# Patient Record
Sex: Female | Born: 1955
Health system: Southern US, Community
[De-identification: ages and names within clinical notes are randomized; demographics above are authoritative.]

## PROBLEM LIST (undated history)

## (undated) DIAGNOSIS — N189 Chronic kidney disease, unspecified: Secondary | ICD-10-CM

## (undated) DIAGNOSIS — F17200 Nicotine dependence, unspecified, uncomplicated: Secondary | ICD-10-CM

## (undated) DIAGNOSIS — M1990 Unspecified osteoarthritis, unspecified site: Secondary | ICD-10-CM

## (undated) DIAGNOSIS — I10 Essential (primary) hypertension: Secondary | ICD-10-CM

## (undated) DIAGNOSIS — F329 Major depressive disorder, single episode, unspecified: Secondary | ICD-10-CM

## (undated) DIAGNOSIS — F32A Depression, unspecified: Secondary | ICD-10-CM

## (undated) DIAGNOSIS — R918 Other nonspecific abnormal finding of lung field: Secondary | ICD-10-CM

## (undated) DIAGNOSIS — L930 Discoid lupus erythematosus: Secondary | ICD-10-CM

## (undated) DIAGNOSIS — F419 Anxiety disorder, unspecified: Secondary | ICD-10-CM

## (undated) DIAGNOSIS — Z8719 Personal history of other diseases of the digestive system: Secondary | ICD-10-CM

## (undated) DIAGNOSIS — J449 Chronic obstructive pulmonary disease, unspecified: Secondary | ICD-10-CM

## (undated) DIAGNOSIS — K219 Gastro-esophageal reflux disease without esophagitis: Secondary | ICD-10-CM

## (undated) DIAGNOSIS — C801 Malignant (primary) neoplasm, unspecified: Secondary | ICD-10-CM

## (undated) DIAGNOSIS — E222 Syndrome of inappropriate secretion of antidiuretic hormone: Secondary | ICD-10-CM

## (undated) DIAGNOSIS — G4730 Sleep apnea, unspecified: Secondary | ICD-10-CM

## (undated) DIAGNOSIS — F1011 Alcohol abuse, in remission: Secondary | ICD-10-CM

## (undated) HISTORY — PX: SHOULDER SURGERY: SHX246

## (undated) HISTORY — PX: PANCREAS SURGERY: SHX731

## (undated) HISTORY — PX: BREAST EXCISIONAL BIOPSY: SUR124

## (undated) HISTORY — PX: WISDOM TOOTH EXTRACTION: SHX21

## (undated) HISTORY — PX: COLONOSCOPY W/ BIOPSIES AND POLYPECTOMY: SHX1376

## (undated) HISTORY — PX: ESOPHAGOGASTRODUODENOSCOPY: SHX1529

## (undated) HISTORY — PX: CHOLECYSTECTOMY: SHX55

---

## 1999-06-17 ENCOUNTER — Other Ambulatory Visit: Admission: RE | Admit: 1999-06-17 | Discharge: 1999-06-17 | Payer: Self-pay | Admitting: Obstetrics and Gynecology

## 2000-06-25 ENCOUNTER — Other Ambulatory Visit: Admission: RE | Admit: 2000-06-25 | Discharge: 2000-06-25 | Payer: Self-pay | Admitting: Obstetrics and Gynecology

## 2001-03-31 ENCOUNTER — Encounter: Payer: Self-pay | Admitting: Family Medicine

## 2001-03-31 ENCOUNTER — Encounter: Admission: RE | Admit: 2001-03-31 | Discharge: 2001-03-31 | Payer: Self-pay | Admitting: Family Medicine

## 2001-04-12 ENCOUNTER — Encounter: Admission: RE | Admit: 2001-04-12 | Discharge: 2001-04-12 | Payer: Self-pay | Admitting: Family Medicine

## 2001-04-12 ENCOUNTER — Encounter: Payer: Self-pay | Admitting: Family Medicine

## 2001-04-30 ENCOUNTER — Ambulatory Visit (HOSPITAL_COMMUNITY): Admission: RE | Admit: 2001-04-30 | Discharge: 2001-04-30 | Payer: Self-pay | Admitting: Family Medicine

## 2001-04-30 ENCOUNTER — Encounter: Payer: Self-pay | Admitting: Family Medicine

## 2001-04-30 ENCOUNTER — Encounter (INDEPENDENT_AMBULATORY_CARE_PROVIDER_SITE_OTHER): Payer: Self-pay | Admitting: *Deleted

## 2002-09-06 ENCOUNTER — Other Ambulatory Visit: Admission: RE | Admit: 2002-09-06 | Discharge: 2002-09-06 | Payer: Self-pay | Admitting: Obstetrics and Gynecology

## 2003-01-19 ENCOUNTER — Encounter: Admission: RE | Admit: 2003-01-19 | Discharge: 2003-01-19 | Payer: Self-pay | Admitting: Family Medicine

## 2006-10-28 ENCOUNTER — Inpatient Hospital Stay (HOSPITAL_COMMUNITY): Admission: EM | Admit: 2006-10-28 | Discharge: 2006-10-30 | Payer: Self-pay | Admitting: Emergency Medicine

## 2007-02-22 ENCOUNTER — Ambulatory Visit (HOSPITAL_COMMUNITY): Admission: RE | Admit: 2007-02-22 | Discharge: 2007-02-22 | Payer: Self-pay | Admitting: Surgery

## 2007-02-22 ENCOUNTER — Encounter (INDEPENDENT_AMBULATORY_CARE_PROVIDER_SITE_OTHER): Payer: Self-pay | Admitting: Surgery

## 2007-03-17 ENCOUNTER — Other Ambulatory Visit: Admission: RE | Admit: 2007-03-17 | Discharge: 2007-03-17 | Payer: Self-pay | Admitting: Family Medicine

## 2007-04-01 ENCOUNTER — Encounter: Admission: RE | Admit: 2007-04-01 | Discharge: 2007-04-01 | Payer: Self-pay | Admitting: Family Medicine

## 2007-06-23 ENCOUNTER — Other Ambulatory Visit: Admission: RE | Admit: 2007-06-23 | Discharge: 2007-06-23 | Payer: Self-pay | Admitting: Diagnostic Radiology

## 2007-06-23 ENCOUNTER — Encounter: Admission: RE | Admit: 2007-06-23 | Discharge: 2007-06-23 | Payer: Self-pay | Admitting: Dermatology

## 2007-06-23 ENCOUNTER — Encounter (INDEPENDENT_AMBULATORY_CARE_PROVIDER_SITE_OTHER): Payer: Self-pay | Admitting: Diagnostic Radiology

## 2008-02-04 HISTORY — PX: CHOLECYSTECTOMY: SHX55

## 2008-09-01 ENCOUNTER — Encounter: Admission: RE | Admit: 2008-09-01 | Discharge: 2008-09-01 | Payer: Self-pay | Admitting: Gastroenterology

## 2008-09-23 ENCOUNTER — Ambulatory Visit: Payer: Self-pay | Admitting: Diagnostic Radiology

## 2008-09-23 ENCOUNTER — Encounter: Payer: Self-pay | Admitting: Emergency Medicine

## 2008-09-24 ENCOUNTER — Observation Stay (HOSPITAL_COMMUNITY): Admission: EM | Admit: 2008-09-24 | Discharge: 2008-09-25 | Payer: Self-pay | Admitting: Internal Medicine

## 2009-01-02 ENCOUNTER — Ambulatory Visit (HOSPITAL_COMMUNITY): Admission: RE | Admit: 2009-01-02 | Discharge: 2009-01-02 | Payer: Self-pay | Admitting: Gastroenterology

## 2009-10-10 ENCOUNTER — Ambulatory Visit: Payer: Self-pay | Admitting: Diagnostic Radiology

## 2009-10-10 ENCOUNTER — Encounter: Payer: Self-pay | Admitting: Emergency Medicine

## 2010-01-10 ENCOUNTER — Inpatient Hospital Stay (HOSPITAL_COMMUNITY): Admission: EM | Admit: 2010-01-10 | Discharge: 2009-10-13 | Payer: Self-pay | Admitting: Internal Medicine

## 2010-04-18 LAB — URINALYSIS, ROUTINE W REFLEX MICROSCOPIC
Bilirubin Urine: NEGATIVE
Glucose, UA: NEGATIVE mg/dL
Hgb urine dipstick: NEGATIVE
Ketones, ur: NEGATIVE mg/dL
Nitrite: NEGATIVE
Protein, ur: NEGATIVE mg/dL
Specific Gravity, Urine: 1.003 — ABNORMAL LOW (ref 1.005–1.030)
Urobilinogen, UA: 0.2 mg/dL (ref 0.0–1.0)
pH: 6.5 (ref 5.0–8.0)

## 2010-04-18 LAB — DIFFERENTIAL
Basophils Absolute: 0.1 K/uL (ref 0.0–0.1)
Basophils Relative: 2 % — ABNORMAL HIGH (ref 0–1)
Eosinophils Absolute: 0.2 K/uL (ref 0.0–0.7)
Eosinophils Relative: 2 % (ref 0–5)
Lymphocytes Relative: 21 % (ref 12–46)
Lymphs Abs: 2 K/uL (ref 0.7–4.0)
Monocytes Absolute: 0.8 K/uL (ref 0.1–1.0)
Monocytes Relative: 8 % (ref 3–12)
Neutro Abs: 6.2 K/uL (ref 1.7–7.7)
Neutrophils Relative %: 67 % (ref 43–77)

## 2010-04-18 LAB — COMPREHENSIVE METABOLIC PANEL
ALT: 18 U/L (ref 0–35)
Albumin: 3.3 g/dL — ABNORMAL LOW (ref 3.5–5.2)
Alkaline Phosphatase: 90 U/L (ref 39–117)
BUN: 2 mg/dL — ABNORMAL LOW (ref 6–23)
BUN: 8 mg/dL (ref 6–23)
CO2: 21 mEq/L (ref 19–32)
Calcium: 9.1 mg/dL (ref 8.4–10.5)
Calcium: 9.8 mg/dL (ref 8.4–10.5)
Creatinine, Ser: 0.55 mg/dL (ref 0.4–1.2)
GFR calc non Af Amer: >60 mL/min (ref >60)
Glucose, Bld: 112 mg/dL — ABNORMAL HIGH (ref 70–99)
Potassium: 4.2 mEq/L (ref 3.5–5.1)
Total Protein: 6.9 g/dL (ref 6.0–8.3)
Total Protein: 8.5 g/dL — ABNORMAL HIGH (ref 6.0–8.3)

## 2010-04-18 LAB — CBC
HCT: 33.4 % — ABNORMAL LOW (ref 36.0–46.0)
Hemoglobin: 11.5 g/dL — ABNORMAL LOW (ref 12.0–15.0)
MCH: 32.6 pg (ref 26.0–34.0)
MCH: 33.2 pg (ref 26.0–34.0)
MCH: 33.9 pg (ref 26.0–34.0)
MCHC: 34.4 g/dL (ref 30.0–36.0)
MCHC: 34.4 g/dL (ref 30.0–36.0)
MCHC: 34.6 g/dL (ref 30.0–36.0)
MCHC: 35.6 g/dL (ref 30.0–36.0)
MCV: 93.2 fL (ref 78.0–100.0)
MCV: 94.6 fL (ref 78.0–100.0)
Platelets: 293 10*3/uL (ref 150–400)
Platelets: 309 10*3/uL (ref 150–400)
RDW: 12.4 % (ref 11.5–15.5)
RDW: 12.4 % (ref 11.5–15.5)
RDW: 12.6 % (ref 11.5–15.5)
WBC: 10.9 10*3/uL — ABNORMAL HIGH (ref 4.0–10.5)

## 2010-04-18 LAB — LIPID PANEL
Cholesterol: 165 mg/dL (ref 0–200)
Cholesterol: 169 mg/dL (ref 0–200)
HDL: 43 mg/dL (ref >39)
HDL: 45 mg/dL (ref >39)
LDL Cholesterol: 102 mg/dL — ABNORMAL HIGH (ref 0–99)
Total CHOL/HDL Ratio: 3.8 RATIO
Total CHOL/HDL Ratio: 3.8 RATIO
Triglycerides: 109 mg/dL (ref <150)
Triglycerides: 133 mg/dL (ref <150)

## 2010-04-18 LAB — URINE MICROSCOPIC-ADD ON

## 2010-04-18 LAB — LIPASE, BLOOD
Lipase: 200 U/L — ABNORMAL HIGH (ref 11–59)
Lipase: 266 U/L — ABNORMAL HIGH (ref 11–59)
Lipase: >2000 U/L — ABNORMAL HIGH (ref 23–300)

## 2010-04-18 LAB — POCT CARDIAC MARKERS
CKMB, poc: <1 ng/mL — ABNORMAL LOW (ref 1.0–8.0)
Myoglobin, poc: 35 ng/mL (ref 12–200)

## 2010-04-18 LAB — BASIC METABOLIC PANEL
CO2: 21 mEq/L (ref 19–32)
Glucose, Bld: 103 mg/dL — ABNORMAL HIGH (ref 70–99)
Potassium: 3.9 mEq/L (ref 3.5–5.1)
Sodium: 139 mEq/L (ref 135–145)

## 2010-05-11 LAB — URINE MICROSCOPIC-ADD ON

## 2010-05-11 LAB — COMPREHENSIVE METABOLIC PANEL
ALT: 34 U/L (ref 0–35)
ALT: 35 U/L (ref 0–35)
AST: 48 U/L — ABNORMAL HIGH (ref 0–37)
AST: 84 U/L — ABNORMAL HIGH (ref 0–37)
Albumin: 3 g/dL — ABNORMAL LOW (ref 3.5–5.2)
Albumin: 4.1 g/dL (ref 3.5–5.2)
Alkaline Phosphatase: 671 U/L — ABNORMAL HIGH (ref 39–117)
BUN: 6 mg/dL (ref 6–23)
CO2: 22 mEq/L (ref 19–32)
CO2: 23 mEq/L (ref 19–32)
Chloride: 95 mEq/L — ABNORMAL LOW (ref 96–112)
Chloride: 99 mEq/L (ref 96–112)
GFR calc Af Amer: >60 mL/min (ref >60)
GFR calc Af Amer: >60 mL/min (ref >60)
GFR calc non Af Amer: >60 mL/min (ref >60)
Potassium: 4.1 mEq/L (ref 3.5–5.1)
Sodium: 136 mEq/L (ref 135–145)
Total Bilirubin: 3.9 mg/dL — ABNORMAL HIGH (ref 0.3–1.2)
Total Protein: 7.4 g/dL (ref 6.0–8.3)

## 2010-05-11 LAB — URINALYSIS, ROUTINE W REFLEX MICROSCOPIC
Hgb urine dipstick: NEGATIVE
Ketones, ur: 15 mg/dL — AB
Nitrite: NEGATIVE
Urobilinogen, UA: 2 mg/dL — ABNORMAL HIGH (ref 0.0–1.0)
pH: 6.5 (ref 5.0–8.0)

## 2010-05-11 LAB — DIFFERENTIAL
Basophils Absolute: 0.1 10*3/uL (ref 0.0–0.1)
Eosinophils Absolute: 0 10*3/uL (ref 0.0–0.7)
Eosinophils Relative: 0 % (ref 0–5)
Monocytes Absolute: 0.7 10*3/uL (ref 0.1–1.0)

## 2010-05-11 LAB — TYPE AND SCREEN

## 2010-05-11 LAB — URINE CULTURE: Colony Count: NO GROWTH

## 2010-05-11 LAB — CBC
MCV: 106.1 fL — ABNORMAL HIGH (ref 78.0–100.0)
RBC: 2.9 MIL/uL — ABNORMAL LOW (ref 3.87–5.11)
RBC: 3.24 MIL/uL — ABNORMAL LOW (ref 3.87–5.11)
WBC: 11.3 10*3/uL — ABNORMAL HIGH (ref 4.0–10.5)
WBC: 11.8 10*3/uL — ABNORMAL HIGH (ref 4.0–10.5)

## 2010-05-11 LAB — ABO/RH: ABO/RH(D): O POS

## 2010-05-11 LAB — PROTIME-INR: Prothrombin Time: 13 seconds (ref 11.6–15.2)

## 2010-05-11 LAB — ETHANOL: Alcohol, Ethyl (B): 20 mg/dL — ABNORMAL HIGH (ref 0–10)

## 2010-05-11 LAB — LIPASE, BLOOD: Lipase: 42 U/L (ref 23–300)

## 2010-06-18 NOTE — Consult Note (Signed)
NAME:  Gabriella Farrell, Gabriella Farrell NO.:  [id number]   MEDICAL RECORD NO.:  [id number]          PATIENT TYPE:  INP   LOCATION:  4735                         FACILITY:  [facility]   PHYSICIAN:  Gabriella Sportsman, MD     DATE OF BIRTH:  05/08/1955   DATE OF CONSULTATION:  10/30/2006  DATE OF DISCHARGE:                                 CONSULTATION   CONSULTING SURGEON:  Dr. Michaell Farrell   PRIMARY CARE PHYSICIAN:  [facility]   GASTROENTEROLOGIST:  Gabriella Farrell, Dr. Matthias Farrell   ADMITTING PHYSICIAN:  [facility]   REASON FOR CONSULTATION:  Acute acalculous cholecystitis.   HISTORY OF PRESENT ILLNESS:  [name] is a 51-year female patient,  history of anxiety disorder, tobacco abuse, daily alcohol use, and  irritable bowel syndrome which is followed by Dr. Matthias Farrell.  She was  admitted October 28, 2006, secondary to severe substernal chest pain.  Subsequent cardiac evaluation was negative for acute ischemic issues.  Her initial LFTs were found to be elevated with elevated total  bilirubin, alkaline phosphatase, AST and ALT; these levels have slowly  trended down since admission.  CT scan done on admission demonstrated  marked fatty infiltration of the liver and suspected colonic wall  thickening of the ascending colon.  An ultrasound has been done that  shows no cholelithiasis but definite gallbladder wall thickening with  edema and pericholecystic fluid and sludge consistent with probable  acalculous cholecystitis.  The patient has been treated empirically with  Zosyn for the cholecystitis issue.  Her white count was normal on  admission.  A urine culture has demonstrated greater than 100,000  colonies of E. coli without any sensitivities identified at this point.  At the present time the patient has had a decrease in pain with only  minimal right upper quadrant pain on palpation and is tolerating a diet  without any nausea, vomiting or significant abdominal pain, according to  her.  Surgical consultation has been requested.   REVIEW OF SYSTEMS:  According to the patient, she has a longstanding  history of constipation and diarrhea.  Apparently, this is her irritable  bowel syndrome.  About 3 months ago her pet dog began experiencing  incontinence in the house and every time she reached down to pick up the  droppings she became quite nauseated and began vomiting.  Since that  time she has had progressive anorexia, continued nausea/vomiting, and  then this has later developed into abdominal pain.  She is also  complaining of losing about 13 pounds since the onset of these symptoms,  and again, she has been having some right upper quadrant pain after  eating as well as early satiety and bloating after eating.  She denies  hematochezia, hematemesis or melena.  At the present time she states the  pain is gone although she is still only tolerating small amounts of  diet.  She has always been a very picky eater, according to her husband  and herself.  She does not like chicken, she has never really eaten  greasy or fatty or fried foods, and she has somewhat of an  intolerance  to dairy products.   SOCIAL HISTORY:  The patient does smoke about a pack-and-a-half of  cigarettes per day.  She drinks at least one to two Martinis daily.  She  is married.   FAMILY HISTORY:  No history of colon cancer.  Father:  Diabetes.  Mother  had lung cancer.   PAST MEDICAL HISTORY:  1. Anxiety disorder.  2. Irritable bowel syndrome.  3. Tobacco abuse.  4. Daily alcohol with the finding of fatty infiltration of the liver      on CT scan this admission.   PAST SURGICAL HISTORY:  The patient denies.   ALLERGIES:  No known drug allergies.   HOME MEDICATIONS:  The patient was taking Xanax.  While here, the  patient has been placed on the vitamin B1, multiple vitamin, aspirin,  Protonix, folic acid, Lovenox for DVT prophylaxis, Darvocet for pain and  Zosyn for empiric coverage for  cholecystitis and UTI.   PHYSICAL EXAMINATION:  GENERAL:  Pleasant female patient complaining of  weight loss, anorexia and postprandial bloating and abdominal pain,  which has improved since admission.  VITAL SIGNS:  T-max 100 in the past 24 hours, current is 98.3, BP  100/71, pulse 83 and regular, respirations 20.  NEUROLOGIC:  The patient is alert and oriented x3, moving all  extremities x4.  No focal deficits.  HEENT:  Head is normocephalic.  The patient wears glasses.  NECK:  Supple.  No adenopathy.  CHEST:  Bilateral lung sounds are clear to auscultation.  Respiratory  effort is nonlabored.  She is on room air.  CARDIAC:  S1, S2.  No obvious rubs, murmurs, thrills or gallops.  No  tachycardia.  ABDOMEN:  Soft, slightly bloated, but nontender to palpation.  She is  mildly tender in the right upper quadrant without guarding.  No definite  Murphy's sign at this point.  Bowel sounds are present.  EXTREMITIES:  Thin and symmetrical in appearance without edema, cyanosis  or clubbing.  Pulses are palpable.   LABORATORY:  Total bilirubin has remained stable and/or trending down  1.6, 1.3, and then 1.4.  Alkaline phosphatase has gone from 290 to 208.  AST has gone from 189 to 125.  ALT from 59 to 43.  An acute hepatitis  panel was negative.  A urine culture again shows greater than 100,000  colonies of E. coli with sensitivities pending.  White count 6800;  hemoglobin 9.2; platelets are 90,000 today; on admission they were  150,000; MCV is elevated at 113.  TSH is 4.7.  Lipase has been normal.   DIAGNOSTICS:  A chest x-ray showed no acute process.  CT and ultrasound  of the abdomen as noted.   IMPRESSION:  1. Nausea, vomiting, abdominal pain secondary to presumed biliary      colic and acute acalculous cholecystitis.  2. Escherichia coli urinary tract infection, possibly contributing.  3. Fatty liver and transaminitis, probably associated with the      acalculous cholecystitis as  well as alcoholic hepatitis.  4. Tobacco abuse.  5. Daily alcohol use.  6. Irritable bowel syndrome with constipation alternating with      diarrhea.  7. Macrocytic anemia, probably from regular alcohol use.  8. Anxiety disorder.   PLAN:  1. Since the patient is tolerating a diet for her and is eager to      return home and feels that her pain and other symptoms are      otherwise well managed, we have arranged  for outpatient followup      with Dr. Michaell Farrell.  She will see him on Tuesday, October 7 at 11:15      a.m.  This is to discuss elective cholecystectomy.  The patient has      been given information on the pink discharge sheet regarding      symptoms to look for that she needs to contact the surgeon's office      regarding fevers, nausea, vomiting, diarrhea, or increase or change      in abdominal pain.  2. I have discussed with the patient and her husband methods to      optimize protein and other dietary intake including small portions.      Because of the suspected cholecystitis I have asked that they      decrease fat intake and avoid dairy products if possible.  3. Recommend the patient take antibiotic therapy for another 5 days.      Before determining final oral agent before discharge, need to      follow up on urine culture.  4. Would continue Darvocet for pain.  5. Given the fact that the patient is anemic, has some alcohol      history, and she has some thickening in her ascending colon, once      she has a cholecystectomy and if symptoms do not improve the      patient may wish to pursue an additional gastroenterology workup      with Dr. Matthias Farrell as an outpatient.      Allison L. Rolene Course      Gabriella Sportsman, MD  Electronically Signed    ALE/MEDQ  D:  10/30/2006  T:  10/31/2006  Job:  [id number]   cc:   Bernette Redbird, M.D.

## 2010-06-18 NOTE — Op Note (Signed)
NAME:  Gabriella Farrell, Gabriella Farrell             ACCOUNT NO.:  [id number]   MEDICAL RECORD NO.:  [id number]          PATIENT TYPE:  INP   LOCATION:  1407                         [facility]ACILITY[facility]  [facility]   PHYSICIAN:  John C. Madilyn Fireman, M.D.    DATE OF BIRTH:  1955-04-15   DATE OF PROCEDURE:  09/24/2008  DATE OF DISCHARGE:  09/25/2008                               OPERATIVE REPORT   PROCEDURE PERFORMED:  Attempted endoscopic retrograde  cholangiopancreatography.   INDICATIONS FOR PROCEDURE:  Suspected bile leak after in endoscopic  ultrasound-guided aspiration of a pancreatic cyst with suspected injury  with suspected entry into the bile duct.   PROCEDURE:  The patient was placed in the prone position and placed on  the pulse monitor with continuous low-flow oxygen delivered by nasal  cannula.  She was sedated with 125 mcg IV fentanyl and 10 mg IV Versed.  The Olympus video side-viewing endoscope was advanced blindly into the  oropharynx, esophagus, and stomach.  The stomach appeared grossly  normal.  The pylorus was traversed and papilla of Vater located on the  duodenal wall.  The papilla had a markedly bulbous and enlarged  appearance but otherwise looked fairly normal.  There appeared to be  some submucosal edema in the duodenum proximal to it as well, presumably  from the previous EUS and puncture.  No bile was seen anywhere in the  duodenum throughout the procedure.  I attempted cannulation of the  ampulla with the standard and tapered sphincterotome but was unable to  access either duct with or without a guidewire.  I was unable to opacify  either duct with shallow cannulation of the ampulla.  Multiple attempts  were made and I suspected there might be some distortion the papilla,  possibly from the EUS and subsequent leak causing extravasation and  distortion of the papilla that was hindering entry.  As noted, no bile  was ever seen to exit from the duct.  After prolonged attempts, the  procedure was  terminated.  The scope was withdrawn and the patient  returned to the recovery room in stable condition.  She tolerated the  procedure well.  There were no immediate complications.   IMPRESSION:  Edema of the papilla with unsuccessful endoscopic  retrograde cholangiopancreatography attempt and no bile seen to exit  from the papilla during this examination.  Possibly representing some  extravasation of bile submucosally although this is not clear.   PLAN:  Continue IV antibiotics.  Will discuss the case with physicians  at [facility], who performed her endoscopic ultrasound.  Consider  transfer there for more definitive therapy.           ______________________________  Everardo All. Madilyn Fireman, M.D.     JCH/MEDQ  D:  09/24/2008  T:  09/25/2008  Job:  [id number]   cc:   Bernette Redbird, M.D.  Fax: [phone]

## 2010-06-18 NOTE — Discharge Summary (Signed)
NAME:  Gabriella Farrell, Gabriella Farrell NO.:  [id number]   MEDICAL RECORD NO.:  [id number]          PATIENT TYPE:  EMS   LOCATION:  ED                            FACILITY:  [facility]   PHYSICIAN:  Hollice Espy, M.D.DATE OF BIRTH:  [birth year]   DATE OF ADMISSION:  09/23/2008  DATE OF DISCHARGE:  09/24/2008                               DISCHARGE SUMMARY   CONSULTANTS:  Bernette Redbird, M.D., [facility].   DISCHARGE DIAGNOSES:  1. Suspected bile leak secondary to recent pancreatic cyst drainage.  2. Urinary tract infection.  3. Nausea, vomiting secondary to #1.  4. Ascites.   DISCHARGE MEDICATIONS:  Discharge medications for this patient are as  follows.  The patient is being transferred to [facility].  Medications will be as per the hospital there.  However, the meantime  she will be continued on her previous medicines:  Prozac 20 mg p.o.  daily, Protonix 40 p.o. daily.  The patient had been previously on IV  Zosyn during this hospitalization.  Medication may be continued at the  discretion of [facility].   HOME MEDICATIONS:  She previously had been on Abilify, Actonel,  Citracal, multivitamin, Nuvigil, p.r.n. Percocet, Prozac, and p.r.n.  Xanax.   HOSPITAL COURSE:  The patient is a 55 year old white female who had been  recently diagnosed with a previous history of alcohol abuse and  secondary liver dysfunction but had been doing relatively well when she  was evaluated by Dr. Bernette Redbird of [facility] in the outpatient  setting and found to have a cyst on the head of her pancreas, which  looked to be causing secondary obstruction.  The patient was referred to  [facility].  At that time, she was evaluated by Dr. Hyacinth Meeker of  gastroenterology at [facility], who evaluated the patient and performed  an endoscopy with secondary drainage of cyst.  Patient was kept  overnight secondary to complications of abdominal pain and sent home.  She continued to  have problems with abdominal pain and presented after 2  days of abdominal pain and swelling to the [facility] Emergency Room.  Upon evaluation, the patient was found to have by CT of the abdomen and  pelvis, mild ascites noted primarily about the liver.  Also adjacent  left pericolic gutter, a large cystic lesion again noted in the head of  pancreas, and was concerning for a distal type 1 choledochal cyst,  although cystic mass at the pancreas could not be excluded with  resultant obstruction of the common hepatic duct.  Distention of the  gallbladder was noted with significant gallbladder wall thickening,  although this was felt to be nonspecific in the presence of ascites.  The liver looked to be otherwise normal.  There was also some mild  dilatation of small bowel loops, looking to be a possible early partial  small-bowel obstruction versus ileus.  In addition, a follow-up  abdominal x-ray 12 hours later confirmed persistent ileus.  The patient  was admitted, started on IV fluids for hydration.  Dr. Matthias Hughs, who was  familiar with the patient, was consulted  and discussed this case with  Dr. Hyacinth Meeker in gastroenterology of [facility].  What was recommended  initially was a biliary stent.  Dr. Buccini's partner, Dr. Vilinda Boehringer,  attempted a biliary stent placement.  However, he was unable to have  access to the biliary duct and was unable to place any sort of stent.  This was then communicated to [facility], who then, given the initial  procedure and secondary complications, had started there.  It was then  recommended that patient be transferred to [facility].  The  patient was accepted for transfer, and a bed became available on the  evening of September 24, 2008, the  same day the patient was admitted from the emergency room at [facility].  Her overall disposition is minimal improvement, given her pain is  slightly more tolerated.  Her disposition will be bedrest.  Her  current  diet is n.p.o. except for medications, and she is being transferred via  ambulance transport to [facility].      Hollice Espy, M.D.  Electronically Signed     SKK/MEDQ  D:  09/24/2008  T:  09/24/2008  Job:  [id number]   cc:   Bernette Redbird, M.D.  Fax: [phone]   Chales Salmon. Abigail Miyamoto, M.D.  Fax: [phone]

## 2010-06-18 NOTE — Consult Note (Signed)
NAME:  Gabriella Farrell, Gabriella Farrell NO.:  [id number]   MEDICAL RECORD NO.:  [id number]          PATIENT TYPE:  INP   LOCATION:  1407                         FACILITY:  [facility]   PHYSICIAN:  Bernette Redbird, M.D.   DATE OF BIRTH:  Mar 01, 1955   DATE OF CONSULTATION:  09/24/2008  DATE OF DISCHARGE:  09/25/2008                                 CONSULTATION   REASON FOR CONSULTATION:  Dr. Sonda Primes of the [facility]  asked [place] to see this 55 year old female because of abdominal pain  following a biliary tract procedure at [facility].   Mrs. Celedonio Miyamoto is a patient whom I follow in the office.  She has a  history of ethanol abuse and associated elevation of liver enzymes.  Due  to a reduction in her alcohol intake, her LFTs had actually normalized  as of several months ago, but when I saw her last month in routine  follow-up, they were back up again, and in fact she was frankly  jaundiced.  This prompted an abdominal ultrasound which showed the new  finding of a cystic lesion in the head of the pancreas, not observed 2  years earlier on a prior CT scan.  There was concern that this may be a  cystic neoplasm of the pancreas, which prompted referral to a surgeon at  [facility], Dr. Gerlean Ren, and he arranged for endoscopic  ultrasound and drainage of the cystic lesion to further delineate its  character.  This was performed at [facility] on Thursday.  From talking  with the physicians at [facility], apparently the initial transduodenal  needle passage was associated with drainage of about 10 mL of bilious  fluid.  A subsequent pass was made into the cystic lesion itself,  returning brown fluid.  The patient had significant postprocedural pain  prompting overnight hospitalization at [facility], and she was then  sent home with some degree of discomfort, but the discomfort intensified  over the next 24 hours such that she presented to our emergency room  here  yesterday with severe diffuse abdominal pain, no fevers.  CT scan  showed some fluid in the abdomen which was a new finding, she had a mild  leukocytosis, and interestingly, her bilirubin had dropped substantially  from the time her procedure was done at [facility].  In discussing this  case with the attending physician at [facility], it was felt that this  is probably a bile leak secondary to the transduodenal needle aspiration  procedure, and that the patient would benefit from prompt biliary  stenting.  In fact, there had already been plans for elective biliary  stenting next week at [facility] because of the patient's jaundice and  pruritus, not knowing at the time those plans were made that both of  those conditions would resolve simply from the cyst aspiration that was  accomplished.  It was the opinion of the biliary attending at [facility] from telephone conversation with the other attending, Dr. Duayne Cal at  [facility] with whom I spoke, that the patient did not need a nuclear  medicine confirmation of a bile leak first and that going straight to  biliary stenting would be clinically appropriate.   PAST MEDICAL HISTORY:  No known allergies.   OUTPATIENT MEDICATIONS:  Abilify, Actonel, Citracal, multiple vitamins,  Nuvigil, Percocet, Prozac, Xanax.   CHRONIC MEDICAL ILLNESSES:  Ethanol abuse without known history of  pancreatitis, also history of anxiety, osteoarthritis, agoraphobia.   HABITS:  She does smoke.  She has cut down her drinking substantially  but still does drink.   FAMILY HISTORY:  Not obtained.   REVIEW OF SYSTEMS:  The patient was having severe pruritus earlier but  that has pretty well resolved with the reduction in her serum bilirubin  recently.   PHYSICAL EXAM:  VITAL SIGNS: Unremarkable including the absence of  fever.  GENERAL APPEARANCE:  The patient at this time is either anicteric or  with minimal jaundice.  She is not in acute distress but is  lying in  bed, somewhat immobile to avoid provoking pain.  CHEST:  Clear to auscultation.  HEART:  Normal, without rubs or murmurs or arrhythmias.  ABDOMEN:  Has quiet bowel sounds.  It is slightly distended.  It is  diffusely tender to mild palpation, but without rigidity or rebound.   LABS:  Admission white count 11,800, today it is 11,300.  MCV is  elevated at 107, hemoglobin following hydration is 10.8, platelets  normal at 333,000.  INR normal at 1.  Sodium low at 128 which is similar  to what it was at [facility] Renal function normal with BUN 6 and  creatinine 0.6. Total bilirubin at this time is 4.4 as compared to 3.9  last night, both substantially improved compared to when she was at  [facility].  Alk phos 671, down from 944 last night. AST 48 improved  from 84 yesterday. ALT 34, stable.  Albumin, lipase normal at 42.  Alcohol level was mildly elevated at 20 (normal up to 10).  Ammonia  level 46 which is slightly above the normal range, although the patient  is not encephalopathic at this time, quite alert, coherent and  appropriate.   CT scan:  Please see ER report.  There is still a cystic lesion present,  measuring approximately 4.7 cm in greatest dimension which is really not  different from her pre-aspiration ultrasound from 6 weeks ago.  There is  an ileus pattern, no obvious obstruction.  There is fluid within the  abdominal cavity which is a new finding compared to the ultrasound from  6 weeks ago, no mention of free air.   IMPRESSION:  1. Abdominal pain since biliary cystic aspiration procedure, almost      certainly due to bile peritonitis from a bile leak.  2. Pancreatic head cystic lesion without mass seen on endoscopic      ultrasound, suspect pancreatic pseudocyst (very high amylase was      present in the aspirated fluid at [facility]).  3. Recent obstructive jaundice, responding to cyst aspiration.  4. History of ethanol abuse.  5. History of anxiety  and psychiatric conditions.   PLAN:  Based on recommendation of [facility], we will proceed directly  to ERCP today and will forego obtaining a preprocedural nuclear medicine  scan.  The nature, purpose and risks of the procedure were discussed  carefully with the patient and her husband.  They were already somewhat  familiar with this from discussion at [facility].  They are agreeable  to proceed.  Note that [facility] did not have beds to take the patient  in transfer today.  Total time for today's consultation was  approximately 1-1/2 hours, much of it spent on the phone coordinating  care with the physicians at [facility] and Dr. Dorena Cookey, who will be  doing her ERCP procedure, and arranging that procedure.           ______________________________  Bernette Redbird, M.D.     RB/MEDQ  D:  09/24/2008  T:  09/25/2008  Job:  [id number]   cc:   Clovis Riley, FNP  [facility] @ [facility]  [address]  [place], [place] [address]

## 2010-06-18 NOTE — H&P (Signed)
NAME:  Gabriella Farrell, Gabriella Farrell NO.:  [id number]   MEDICAL RECORD NO.:  [id number]          PATIENT TYPE:  EMS   LOCATION:  ED                            FACILITY:  [facility]   PHYSICIAN:  Georgina Quint. Plotnikov, MDDATE OF BIRTH:  October 11, 1955   DATE OF ADMISSION:  09/24/2008  DATE OF DISCHARGE:                              HISTORY & PHYSICAL   CHIEF COMPLAINT:  Abdominal pain.   HISTORY OF PRESENT ILLNESS:  The patient is a 55 year old female with  complex medical history who presented to [facility] ER last night with  severe upper abdominal pain, rated at 9 out of 10, not relieved with  anything. She developed jaundice a couple weeks ago and was found to  have a new cyst on the head of the pancreas by ultrasound and CT scan.  Dr. Matthias Hughs referred her to [facility] for possible operative  procedure. There she was referred to Dr. Hyacinth Meeker for an endoscopy with  biopsy of the pseudocyst. Dr. Hyacinth Meeker did the procedure of last Thursday.  Apparently there was quite a bit of fluid  drained off the cyst.  The  patient had abdominal pain following the procedure and was kept  overnight for observation.  She was discharged yesterday. Over the past  2 days she developed quite a bit of the abdominal swelling and pain and  gained 4 pounds.  She did not have any bowel movement lately.  Initially  she was planned to be transferred to [facility]. However, there were no  beds there and I was asked to admit the patient.   ALLERGIES:  NO KNOWN DRUG ALLERGIES.   CURRENT MEDICATIONS:  Abilify, Actonel, Citracal, multivitamin, Nuvigil,  Percocet, Prozac and Xanax.   SOCIAL HISTORY:  The patient is married.  She does not work at present.  She is a Architectural technologist. She smokes  half pack a day.  She does not drink alcohol.  She used to drink quite a  bit in the past.   FAMILY HISTORY:  Negative for lupus nor liver disease.   REVIEW OF SYSTEMS:  No chest pain or  shortness of breath.  No syncope.  Abdominal pain and jaundice as above.  Abdominal distention as above.  Not passing gas. No neurologic complaints.  No leg swelling.  The rest  of the 18-point review of systems is as above or negative.   PHYSICAL EXAMINATION:  VITAL SIGNS:  Temperature 98.2, heart rate 95,  respirations 16, sat's 98% on room air. Blood pressure 122/73. GENERAL:  She appears chronically ill.  She is tanned and jaundiced.  HEENT:  With  slightly dryish oral mucosa.  NECK:  Supple.  No meningeal signs.  LUNGS:  Clear.  No wheezes or rales.  HEART:  S1 and S2. Slight  tachycardia.  ABDOMEN:  Distended, sensitive throughout.  No rebound  symptoms.  No masses.  Ascites by percussion.  No adenopathy, no lower  edema.  Calves nontender.  Pulses normal. Muscle strength within normal  limits.  Cranial nerves II-XII nonfocal.  No asterixis.  SKIN:  Without  excoriations.  LABORATORY DATA:  CT of the abdomen: pleural effusion on the right  (trace amount) atelectasis at the lung bases, right more than left, mild  ascites.  There is 4.7 x 4.2 cm cyst of the head of the pancreas  obstructing colon bile duct, possible early small-bowel obstruction.  Pelvic CT scan reveals moderate ascites.   Sodium 136, potassium 4.1, creatinine 0.6, AST 84, ALT 35, alk phos 944,  total bilirubin 3.9.  Urinalysis with 7-10 WBC's, lipase 42, hemoglobin  12.0, white cell count 11.8, platelets 331,000.   ASSESSMENT/PLAN:  1. Abdominal pain due to the problems below, start IV morphine prn.  2. Abdominal distention due to problem #3 and #4.  3. Early small bowel obstruction.  Will ask for GI consultation      tomorrow.  4. Ascites. May need paracenteses by ultrasound.  5. Status post pancreatic cyst biopsy with increasing abdominal pain      and elevated white cell count, rule out infection.  Will start on      empiric IV Zosyn.  6. Urinary tract infection.  Plan as per #5.  7. Jaundice.  Plan as  above.  8. Anxiety, on therapy.  9. DVT prophylaxis with sequential compression devices.      Georgina Quint. Plotnikov, MD  Electronically Signed     AVP/MEDQ  D:  09/24/2008  T:  09/24/2008  Job:  [id number]   cc:   Chales Salmon. Abigail Miyamoto, M.D.  Fax: [phone]   Bernette Redbird, M.D.  Fax: [phone]   Dr. Hyacinth Meeker - GI at [place]

## 2010-06-18 NOTE — H&P (Signed)
NAME:  Gabriella Farrell, Gabriella Farrell             ACCOUNT NO.:  [id number]   MEDICAL RECORD NO.:  [id number]          PATIENT TYPE:  INP   LOCATION:  4735                         FACILITY:  [facility]   PHYSICIAN:  Kela Millin, M.D.DATE OF BIRTH:  Apr 27, 1955   DATE OF ADMISSION:  10/28/2006  DATE OF DISCHARGE:                              HISTORY & PHYSICAL   CHIEF COMPLAINT:  Chest pain.   HISTORY OF PRESENT ILLNESS:  The patient is a 56 year old white female  with past medical history significant for anxiety disorder, tobacco  abuse and she states that she was also told that she has irritable bowel  syndrome per GI and she presents with the above complaints.  She reports  that she has had chest pain on and off for the past few months and that  she saw the PA at her PCP's office regarding this at some point.  She  describes the pain as a pressure/an elephant sitting on her chest,  7/10 in intensity at its worse and lasting 15 minutes at a time.  She  states that she has had associated nausea and vomiting with the pain.  She denies shortness of breath, diaphoresis, and no paresthesias, also  no radiation.  The patient also states that she has had decreased p.o.  overtake over the past few months as a result of these symptoms and she  has had some weight loss as well.  Her husband is at the bedside  assisting with the history and they report that the patient has also  been having some lower abdominal pain for weeks.  She saw Dr. Matthias Hughs in  the past.  She admits to alternating diarrhea and constipation and that  she was told by Dr. Matthias Hughs that she possibly had irritable bowel  syndrome.  She denies cough, fevers, dysuria, melena and no  hematochezia.   The patient was seen in the ER and the chest x-ray was negative for  acute infiltrates.  Abdominal x-rays showed unremarkable gas pattern  without evidence of obstruction or significant ileus, left upper pole  renal calculi was suspected and her  point of care markers were negative.  EKG did not show any acute ischemic findings.  Her LFT's were noted to  be elevated and she is admitted for further evaluation and management.  At the time of interviewing the patient in the ER, she states that her  pain resolved after she was given Ativan.  She was noted to have a flat  affect throughout the interview.   PAST MEDICAL HISTORY:  As above.   MEDICATIONS:  Xanax 0.5 mg p.r.n.   ALLERGIES:  NKDA.   SOCIAL HISTORY:  She admits to alcohol, one to two drinks (martinis  daily).  She also admits to tobacco abuse, one and a half packs per days  for greater than 30 years.  She denies any history of alcohol withdraw.   FAMILY HISTORY:  Her mother is deceased.  She had lung cancer.  Her  father deceased.  He had diabetes.   REVIEW OF SYSTEMS:  As per HPI.  Other review of systems negative.  PHYSICAL EXAMINATION:  GENERAL:  The patient is thin appearing, in no  respiratory distress.  Flat affect noted.  VITAL SIGNS:  Her temperature is 99.4 with a blood pressure of 112/74.  Her pulse is 84.  Her pulse 84.  Respiratory rate 15. O2 SAT of 99%.  HEENT:  PERRL, EOMI, sclerae anicteric.  Slightly dry mucous membranes.  No oral exudates.  NECK:  No adenopathy, no thyromegaly and no JVD.  LUNGS:  Clear to auscultation bilaterally, no crackles or wheezes.  CARDIOVASCULAR:  Regular rate and rhythm.  Normal S1, S2.  No murmurs,  no gallops.  ABDOMEN:  Epigastric tenderness.  No rebound tenderness.  Bowel sounds  present.  Soft, nondistended and no masses palpable.  EXTREMITIES:  No cyanosis, no edema.  NEURO:  Alert and oriented x3.  Cranial nerves II-XII grossly intact.  Nonfocal exam.   LABORATORY DATA:  Point of care markers are negative times one.  Sodium  134, potassium 3.6, chloride of 101, BUN 4, glucose of 58.  Her pH is  7.47, with a PCO2 of 29.8.  Her white cell count is 8.9 with a  hemoglobin of 11.9, hematocrit of 34.4, platelet  count of 150.  Her  albumin is 3.2, AST 189, ALT 59, alkaline phosphatase is 290.  Total  bilirubin is 1.6 and the direct bilirubin is 0.6, her lipase is 30.  The  imaging studies are as per HPI.   ASSESSMENT/PLAN:  1. Chest pain, as discussed above.  We will obtain serial cardiac      enzymes and a fasting lipid profile.  Place the patient on aspirin,      nitroglycerin, follow and consider out-patient versus in-patient      cardiac consultation pending enzymes.  Also noted is that the      patient's chest pain was relieved by Ativan, ? likely component of      anxiety.  Follow and consider psych consultation as well.  Optimize      treatment.  We will also place the patient on a PPI to cover      possible GI etiology as the patient with epigastric tenderness on      exam.  2. Abnormal liver functions tests, we will obtain a hepatitis panel,      right upper quadrant ultrasound and recheck liver function tests in      the a.m. to further evaluate.  As noted, the patient admits to      alcohol use, we will follow.  3. Alcohol use, as above.  We will place on multivitamins and thiamine      and also place on p.r.n. Ativan and follow.  4. Anxiety disorder, continue Benzodiazepine, consider psych consult      as above to optimize treatment.  5. ? Renal calculi - no obstrction reported, follow      Kela Millin, M.D.  Electronically Signed     ACV/MEDQ  D:  10/29/2006  T:  10/29/2006  Job:  [id number]

## 2010-06-18 NOTE — Op Note (Signed)
NAME:  Gabriella Farrell, Gabriella Farrell             ACCOUNT NO.:  [id number]   MEDICAL RECORD NO.:  [id number]          PATIENT TYPE:  AMB   LOCATION:  DAY                          FACILITY:  [facility]   PHYSICIAN:  Ardeth Sportsman, MD     DATE OF BIRTH:  26-Mar-1955   DATE OF PROCEDURE:  02/22/2007  DATE OF DISCHARGE:                               OPERATIVE REPORT   PRIMARY CARE PHYSICIAN:  Chales Salmon. Abigail Miyamoto, MD.   GASTROENTEROLOGIST:  Bernette Redbird, MD.   PREOPERATIVE DIAGNOSIS:  Rectal adenoma in the distal posterior midline.   POSTOPERATIVE DIAGNOSIS:  Rectal adenoma in the distal posterior  midline.   PROCEDURE:  1. Examination under anesthesia.  2. Transanal excision of a posterior midline rectal polyp.   SURGEON:  Ardeth Sportsman, MD.   ANESTHESIA:  1. Underwent general anesthesia.  2. Anorectal field block with 0.25% Bupivacaine.   SPECIMENS:  Rectal polyp.   DRAINS:  None.   ESTIMATED BLOOD LOSS:  20 ml.   COMPLICATIONS:  None apparent.   INDICATIONS:  Gabriella Farrell is a 55 year old female, who had a screening  colonoscopy, which showed numerous polyps including one distal rectal  mass that was slightly suspicious for a polyp.  Biopsy came back  consistent with adenoma.  She was sent for evaluation.  Initially it was  rather subtle and not easy to see, but biopsy since that biopsy is  consistent with that of adenoma, and I recommend we do an examination  under anesthesia with excision and closure.   The risks such as stroke, heart attack, deep venous thrombosis,  pulmonary embolus, and even death were discussed.  The risks such as  bleeding, the need for transfusion, wound infection, abscess, injury to  other organs, rectal stricture, recurrence of mass, the need for  resection, and other surgical risks were discussed.  Questions were  answered and she agreed to proceed.   OPERATIVE FINDINGS:  She had a 2 x 2 cm, sessile, soft, posterior,  midline, rectal adenoma that went  down at the anal verge.  The specimen  is labeled as follows:  There is a red stitch that represents the right  lateral, a green stitch represents the left lateral, a blue pen  represents a proximal margin, the yellow pen represents a distal midline  margin, the pink pen represents the right distal and lateral margins,  and the magenta pen represents the left distal and lateral margins.   DESCRIPTION OF PROCEDURE:  Informed consent was confirmed.  The patient  underwent general anesthesia without any difficulty.  She underwent a  general rectal prep.  She received IV antibiotics.  She was positioned  in high lithotomy.  Her perineum and rectum were prepped and draped in a  sterile fashion.   An additional examination revealed a subtle mass posterior renal in  finding.  She was dilated up to allow a smaller and then eventually a  larger anoscope and you could see in the posterior midline probably from  about 7 to 5 o'clock was a 2 x 2 cm sessile polyp that was somewhat  pedunculated.  Cautery was used to mark 1-cm margins circumferentially,  and cautery was used to help free the polyp from the left side over to  the right side circumferentially to take the mass out.  On inspection,  it looked like it had good proximal and left lateral margins.  However,  the right and distal margins were a little shredded.  In inspection, I  did not see any abnormal polyp tissue, but just to be sure I went ahead  and took extra margins from the proximal right lateral to the distal  right lateral - a width of 5 mm of normal tissue.  I also took a swath  from about the midline over towards the left lateral edge.  Note that  the right side was in two pieces.  I went ahead and pinned it as best I  could.   Hemostasis was assured.  I finally closed the defect using a #3-0 Vicryl  running stitch longitudinally from proximally to slightly more distally  and then I closed it transversely using interrupted  figure-of-eight in a  horizontal mattress #3-0 Vicryl stitch with a good result.  This  decreased the stenosis, although there was somewhat of a dimpling  effect.  Note I was able to see and preserve the anal sphincter during  the entire procedure.  Hemostasis was assured.  I went ahead and packed  the wound with Gelfoam.   The patient was extubated and sent to the recovery room in stable  condition.  I am about to explain the operative findings to the  patient's husband.      Ardeth Sportsman, MD  Electronically Signed     SCG/MEDQ  D:  02/22/2007  T:  02/22/2007  Job:  [id number]   cc:   Chales Salmon. Abigail Miyamoto, M.D.  Fax: [phone]   Bernette Redbird, M.D.  Fax: [phone]

## 2010-08-22 ENCOUNTER — Other Ambulatory Visit: Payer: Self-pay | Admitting: Dermatology

## 2010-09-24 ENCOUNTER — Other Ambulatory Visit: Payer: Self-pay | Admitting: Dermatology

## 2010-10-24 LAB — HEMOGLOBIN AND HEMATOCRIT, BLOOD: Hemoglobin: 13.2

## 2010-11-14 LAB — COMPREHENSIVE METABOLIC PANEL
AST: 174 — ABNORMAL HIGH
Albumin: 2.4 — ABNORMAL LOW
BUN: <1 — ABNORMAL LOW
CO2: 24
Calcium: 7.5 — ABNORMAL LOW
Calcium: 7.6 — ABNORMAL LOW
Chloride: 106
Creatinine, Ser: 0.53
Creatinine, Ser: 0.53
GFR calc Af Amer: >60
GFR calc non Af Amer: >60
Glucose, Bld: 144 — ABNORMAL HIGH
Total Bilirubin: 1.4 — ABNORMAL HIGH
Total Protein: 5.2 — ABNORMAL LOW
Total Protein: 5.5 — ABNORMAL LOW

## 2010-11-14 LAB — CBC
HCT: 26.8 — ABNORMAL LOW
HCT: 34.4 — ABNORMAL LOW
MCHC: 34.4
MCHC: 34.5
MCV: 112.8 — ABNORMAL HIGH
MCV: 113.1 — ABNORMAL HIGH
Platelets: 90 — ABNORMAL LOW
RBC: 3.05 — ABNORMAL LOW
RDW: 13.9
WBC: 8.9

## 2010-11-14 LAB — CARDIAC PANEL(CRET KIN+CKTOT+MB+TROPI)
CK, MB: 0.7
Relative Index: INVALID
Relative Index: INVALID
Total CK: 15
Troponin I: 0.03
Troponin I: 0.03
Troponin I: 0.04

## 2010-11-14 LAB — DIFFERENTIAL
Basophils Absolute: 0
Basophils Relative: 1
Eosinophils Absolute: 0
Eosinophils Relative: 0
Lymphocytes Relative: 17
Lymphs Abs: 0.9
Monocytes Absolute: 0.6
Monocytes Relative: 7
Neutro Abs: 5
Neutro Abs: 7.3
Neutrophils Relative %: 82 — ABNORMAL HIGH

## 2010-11-14 LAB — URINALYSIS, ROUTINE W REFLEX MICROSCOPIC
Glucose, UA: NEGATIVE
Hgb urine dipstick: NEGATIVE
Ketones, ur: 15 — AB
pH: 6.5

## 2010-11-14 LAB — I-STAT 8, (EC8 V) (CONVERTED LAB)
Chloride: 101
Glucose, Bld: 58 — ABNORMAL LOW
Potassium: 3.6
Sodium: 134 — ABNORMAL LOW
TCO2: 23
pH, Ven: 7.479 — ABNORMAL HIGH

## 2010-11-14 LAB — URINE CULTURE

## 2010-11-14 LAB — LIPASE, BLOOD: Lipase: 30

## 2010-11-14 LAB — POCT CARDIAC MARKERS: CKMB, poc: <1 — ABNORMAL LOW

## 2010-11-14 LAB — HEPATITIS PANEL, ACUTE
HCV Ab: NEGATIVE
Hep A IgM: NEGATIVE
Hep B C IgM: NEGATIVE

## 2010-11-14 LAB — URINE MICROSCOPIC-ADD ON

## 2010-11-14 LAB — HEPATIC FUNCTION PANEL
ALT: 59 — ABNORMAL HIGH
Alkaline Phosphatase: 290 — ABNORMAL HIGH
Bilirubin, Direct: 0.6 — ABNORMAL HIGH
Indirect Bilirubin: 1 — ABNORMAL HIGH
Total Bilirubin: 1.6 — ABNORMAL HIGH

## 2010-11-14 LAB — POCT I-STAT CREATININE
Creatinine, Ser: 0.6
Operator id: 294521

## 2010-11-14 LAB — CK TOTAL AND CKMB (NOT AT ARMC)
CK, MB: 0.9
Total CK: 20

## 2010-11-14 LAB — LIPID PANEL: Cholesterol: 161

## 2011-11-26 ENCOUNTER — Other Ambulatory Visit: Payer: Self-pay | Admitting: Dermatology

## 2012-04-27 ENCOUNTER — Other Ambulatory Visit: Payer: Self-pay | Admitting: Gastroenterology

## 2012-04-27 DIAGNOSIS — K700 Alcoholic fatty liver: Secondary | ICD-10-CM

## 2012-04-30 ENCOUNTER — Ambulatory Visit
Admission: RE | Admit: 2012-04-30 | Discharge: 2012-04-30 | Disposition: A | Discharge status: Home / Self Care | Payer: Medicare Other | Source: Ambulatory Visit | Attending: Gastroenterology | Admitting: Gastroenterology

## 2012-04-30 DIAGNOSIS — K700 Alcoholic fatty liver: Secondary | ICD-10-CM

## 2012-05-26 ENCOUNTER — Other Ambulatory Visit: Payer: Self-pay | Admitting: Nephrology

## 2012-05-26 ENCOUNTER — Ambulatory Visit
Admission: RE | Admit: 2012-05-26 | Discharge: 2012-05-26 | Disposition: A | Discharge status: Home / Self Care | Payer: Medicare Other | Source: Ambulatory Visit | Attending: Nephrology | Admitting: Nephrology

## 2012-05-26 DIAGNOSIS — E871 Hypo-osmolality and hyponatremia: Secondary | ICD-10-CM

## 2014-01-06 ENCOUNTER — Other Ambulatory Visit: Payer: Self-pay | Admitting: Family Medicine

## 2014-01-06 DIAGNOSIS — E041 Nontoxic single thyroid nodule: Secondary | ICD-10-CM

## 2014-01-12 ENCOUNTER — Ambulatory Visit
Admission: RE | Admit: 2014-01-12 | Discharge: 2014-01-12 | Disposition: A | Discharge status: Home / Self Care | Payer: Medicare Other | Source: Ambulatory Visit | Attending: Family Medicine | Admitting: Family Medicine

## 2014-01-12 DIAGNOSIS — E041 Nontoxic single thyroid nodule: Secondary | ICD-10-CM

## 2014-02-03 HISTORY — PX: SHOULDER SURGERY: SHX246

## 2014-12-04 DIAGNOSIS — F32A Depression, unspecified: Secondary | ICD-10-CM | POA: Insufficient documentation

## 2014-12-04 DIAGNOSIS — F419 Anxiety disorder, unspecified: Secondary | ICD-10-CM | POA: Insufficient documentation

## 2015-02-02 ENCOUNTER — Emergency Department (HOSPITAL_BASED_OUTPATIENT_CLINIC_OR_DEPARTMENT_OTHER): Payer: Medicare Other

## 2015-02-02 ENCOUNTER — Encounter (HOSPITAL_BASED_OUTPATIENT_CLINIC_OR_DEPARTMENT_OTHER): Payer: Self-pay | Admitting: *Deleted

## 2015-02-02 ENCOUNTER — Emergency Department (HOSPITAL_BASED_OUTPATIENT_CLINIC_OR_DEPARTMENT_OTHER)
Admission: EM | Admit: 2015-02-02 | Discharge: 2015-02-02 | Disposition: A | Discharge status: Home / Self Care | Payer: Medicare Other | Attending: Emergency Medicine | Admitting: Emergency Medicine

## 2015-02-02 DIAGNOSIS — M25532 Pain in left wrist: Secondary | ICD-10-CM | POA: Diagnosis not present

## 2015-02-02 DIAGNOSIS — Z87828 Personal history of other (healed) physical injury and trauma: Secondary | ICD-10-CM | POA: Diagnosis not present

## 2015-02-02 DIAGNOSIS — F17210 Nicotine dependence, cigarettes, uncomplicated: Secondary | ICD-10-CM | POA: Insufficient documentation

## 2015-02-02 DIAGNOSIS — F329 Major depressive disorder, single episode, unspecified: Secondary | ICD-10-CM | POA: Diagnosis not present

## 2015-02-02 DIAGNOSIS — R200 Anesthesia of skin: Secondary | ICD-10-CM | POA: Insufficient documentation

## 2015-02-02 DIAGNOSIS — Z79899 Other long term (current) drug therapy: Secondary | ICD-10-CM | POA: Diagnosis not present

## 2015-02-02 HISTORY — DX: Major depressive disorder, single episode, unspecified: F32.9

## 2015-02-02 HISTORY — DX: Depression, unspecified: F32.A

## 2015-02-02 NOTE — ED Notes (Signed)
Left wrist pain since October. She had a fall in October and surgery in November.

## 2015-02-02 NOTE — Discharge Instructions (Signed)
Use over-the-counter pain relievers such as Tylenol or Motrin for your wrist pain. You may try ice or compression if this helps your symptoms. Keep your scheduled follow-up appointment with Dr. Tamera Farrell.   Joint Pain Joint pain, which is also called arthralgia, can be caused by many things. Joint pain often goes away when you follow your health care provider's instructions for relieving pain at home. However, joint pain can also be caused by conditions that require further treatment. Common causes of joint pain include:  Bruising in the area of the joint.  Overuse of the joint.  Wear and tear on the joints that occur with aging (osteoarthritis).  Various other forms of arthritis.  A buildup of a crystal form of uric acid in the joint (gout).  Infections of the joint (septic arthritis) or of the bone (osteomyelitis). Your health care provider may recommend medicine to help with the pain. If your joint pain continues, additional tests may be needed to diagnose your condition. HOME CARE INSTRUCTIONS Watch your condition for any changes. Follow these instructions as directed to lessen the pain that you are feeling.  Take medicines only as directed by your health care provider.  Rest the affected area for as long as your health care provider says that you should. If directed to do so, raise the painful joint above the level of your heart while you are sitting or lying down.  Do not do things that cause or worsen pain.  If directed, apply ice to the painful area:  Put ice in a plastic bag.  Place a towel between your skin and the bag.  Leave the ice on for 20 minutes, 2-3 times per day.  Wear an elastic bandage, splint, or sling as directed by your health care provider. Loosen the elastic bandage or splint if your fingers or toes become numb and tingle, or if they turn cold and blue.  Begin exercising or stretching the affected area as directed by your health care provider. Ask your  health care provider what types of exercise are safe for you.  Keep all follow-up visits as directed by your health care provider. This is important. SEEK MEDICAL CARE IF:  Your pain increases, and medicine does not help.  Your joint pain does not improve within 3 days.  You have increased bruising or swelling.  You have a fever.  You lose 10 lb (4.5 kg) or more without trying. SEEK IMMEDIATE MEDICAL CARE IF:  You are not able to move the joint.  Your fingers or toes become numb or they turn cold and blue.   This information is not intended to replace advice given to you by your health care provider. Make sure you discuss any questions you have with your health care provider.   Document Released: 01/20/2005 Document Revised: 02/10/2014 Document Reviewed: 11/01/2013 Elsevier Interactive Patient Education [employer].

## 2015-02-02 NOTE — ED Notes (Signed)
PA at bedside.

## 2015-02-02 NOTE — ED Provider Notes (Signed)
CSN: [id number]     Arrival date & time 02/02/15  1525 History   First MD Initiated Contact with Patient 02/02/15 1614     Chief Complaint  Patient presents with  . Wrist Pain   HPI  [name] is a 59 year old female with a past medical history of depression presenting with left wrist pain. She reports falling on a flight of stairs 2 months ago and dislocating her left shoulder. She reports persistent left wrist discomfort since the fall. She states that she has discomfort over the dorsum of her left hand over the first and second MCPs and wrist. She also reports tingling in her fingertips and that her hand occasionally loses sensation and feels cold. She states that it intermittently swells though does not appear swollen today. She had surgery on her shoulder by Dr. Tamera Punt with [facility] and reports the symptoms in her left hand has been present since. She states that he told her that she had nerve damage but she is concerned because it has not resolved since her last visit with him. She denies recent traumas or injuries to the wrist. She denies recent changes in the symptoms of the wrist. She has a follow-up appointment with Dr. Tamera Punt in January. She states "I don't remember them even taking an x-ray of my wrist so I think they might have missed a fracture or something". No other complaints today.  Past Medical History  Diagnosis Date  . Depression    Past Surgical History  Procedure Laterality Date  . Shoulder surgery    . Cholecystectomy    . Pancreas surgery     No family history on file. Social History  Substance Use Topics  . Smoking status: Current Every Day Smoker -- 1.00 packs/day    Types: Cigarettes  . Smokeless tobacco: None  . Alcohol Use: Yes     Comment: daily   OB History    No data available     Review of Systems  Musculoskeletal: Positive for arthralgias.  Neurological: Positive for numbness.  All other systems reviewed and are  negative.     Allergies  Review of patient's allergies indicates no known allergies.  Home Medications   Prior to Admission medications   Medication Sig Start Date End Date Taking? Authorizing Provider  ALPRAZolam Duanne Moron) 0.5 MG tablet Take 0.5 mg by mouth every 6 (six) hours as needed for anxiety.   Yes Historical Provider, MD  Armodafinil (NUVIGIL) 250 MG tablet Take 250 mg by mouth daily.   Yes Historical Provider, MD  Calcium-Magnesium-Vitamin D (CALCIUM 1200+D3 PO) Take 1 capsule by mouth daily.   Yes Historical Provider, MD  FLUoxetine (PROZAC) 40 MG capsule Take 40 mg by mouth daily.   Yes Historical Provider, MD  HYDROmorphone (DILAUDID) 2 MG tablet Take 2 mg by mouth every 6 (six) hours as needed for severe pain.   Yes Historical Provider, MD  Multiple Vitamin (MULTIVITAMIN) capsule Take 1 capsule by mouth daily.   Yes Historical Provider, MD   BP 143/78 mmHg  Pulse 87  Temp(Src) 97.8 F (36.6 C) (Oral)  Resp 18  Ht 5\' 6"  (1.676 m)  Wt 59.875 kg  BMI 21.32 kg/m2  SpO2 97% Physical Exam  Constitutional: She appears well-developed and well-nourished. No distress.  HENT:  Head: Normocephalic and atraumatic.  Eyes: Conjunctivae are normal. Right eye exhibits no discharge. Left eye exhibits no discharge. No scleral icterus.  Neck: Normal range of motion.  Cardiovascular: Normal rate, regular rhythm and  intact distal pulses.   Radial pulse palpable. Skin is warm and dry. Cap refill less than 3 seconds.  Pulmonary/Chest: Effort normal. No respiratory distress.  Musculoskeletal: Normal range of motion.       Left hand: She exhibits tenderness. She exhibits normal range of motion, normal capillary refill, no deformity and no swelling. Normal sensation noted. Decreased strength noted.       Hands: No obvious edema or deformity of the hand. Tenderness to palpation as noted in the diagram. Full range of motion of the digits and wrist intact. Patient is unable to make a tight fist  which she reports has been present since her surgery 2 months ago. No atrophy of the musculature of the left forearm or hand.  Neurological: She is alert. Coordination normal.  4/5 grip strength of the left hand. She reports she is unable to make a tight fist since surgery. 5/5 strength of the right hand. Sensation to light touch intact throughout.  Skin: Skin is warm and dry.  No pallor, ecchymosis, duskiness or any color changes noted to the wrist or hand.  Psychiatric: She has a normal mood and affect. Her behavior is normal.  Nursing note and vitals reviewed.   ED Course  Procedures (including critical care time) Labs Review Labs Reviewed - No data to display  Imaging Review Dg Wrist Complete Left  02/02/2015  CLINICAL DATA:  Initial encounter forPosterior wrist pain and swelling off and on since October. EXAM: LEFT WRIST - COMPLETE 3+ VIEW COMPARISON:  None. FINDINGS: There is no evidence of fracture or dislocation. There is no evidence of arthropathy or other focal bone abnormality. Soft tissues are unremarkable. IMPRESSION: Negative. Electronically Signed   By: Misty Stanley M.D.   On: 02/02/2015 17:21   I have personally reviewed and evaluated these images and lab results as part of my medical decision-making.   EKG Interpretation None      MDM   Final diagnoses:  Left wrist pain   Patient presenting with left wrist pain x 2 months. Left hand is neurovascularly intact with FROM. Patient X-Ray negative for obvious fracture or dislocation. Conservative therapy recommended. Discussed RICE therapy and use of OTC pain relievers. Pt advised to follow up with orthopedics to discuss persistent pain. Return precautions discussed at bedside and given in discharge paperwork. Pt is stable for discharge.     Josephina Gip, PA-C 02/02/15 [id number]  Pattricia Boss, MD 02/03/15 [id number]

## 2015-02-07 DIAGNOSIS — M6281 Muscle weakness (generalized): Secondary | ICD-10-CM | POA: Diagnosis not present

## 2015-02-07 DIAGNOSIS — M25512 Pain in left shoulder: Secondary | ICD-10-CM | POA: Diagnosis not present

## 2015-02-07 DIAGNOSIS — S42272D Torus fracture of upper end of left humerus, subsequent encounter for fracture with routine healing: Secondary | ICD-10-CM | POA: Diagnosis not present

## 2015-02-07 DIAGNOSIS — M25612 Stiffness of left shoulder, not elsewhere classified: Secondary | ICD-10-CM | POA: Diagnosis not present

## 2015-02-08 DIAGNOSIS — S42272D Torus fracture of upper end of left humerus, subsequent encounter for fracture with routine healing: Secondary | ICD-10-CM | POA: Diagnosis not present

## 2015-02-08 DIAGNOSIS — M25512 Pain in left shoulder: Secondary | ICD-10-CM | POA: Diagnosis not present

## 2015-02-08 DIAGNOSIS — M6281 Muscle weakness (generalized): Secondary | ICD-10-CM | POA: Diagnosis not present

## 2015-02-08 DIAGNOSIS — M25612 Stiffness of left shoulder, not elsewhere classified: Secondary | ICD-10-CM | POA: Diagnosis not present

## 2015-02-12 DIAGNOSIS — M25612 Stiffness of left shoulder, not elsewhere classified: Secondary | ICD-10-CM | POA: Diagnosis not present

## 2015-02-12 DIAGNOSIS — M25512 Pain in left shoulder: Secondary | ICD-10-CM | POA: Diagnosis not present

## 2015-02-12 DIAGNOSIS — S42272D Torus fracture of upper end of left humerus, subsequent encounter for fracture with routine healing: Secondary | ICD-10-CM | POA: Diagnosis not present

## 2015-02-12 DIAGNOSIS — M6281 Muscle weakness (generalized): Secondary | ICD-10-CM | POA: Diagnosis not present

## 2015-02-19 DIAGNOSIS — S42272D Torus fracture of upper end of left humerus, subsequent encounter for fracture with routine healing: Secondary | ICD-10-CM | POA: Diagnosis not present

## 2015-02-19 DIAGNOSIS — M6281 Muscle weakness (generalized): Secondary | ICD-10-CM | POA: Diagnosis not present

## 2015-02-19 DIAGNOSIS — M25512 Pain in left shoulder: Secondary | ICD-10-CM | POA: Diagnosis not present

## 2015-02-19 DIAGNOSIS — M25612 Stiffness of left shoulder, not elsewhere classified: Secondary | ICD-10-CM | POA: Diagnosis not present

## 2015-02-21 DIAGNOSIS — S42272D Torus fracture of upper end of left humerus, subsequent encounter for fracture with routine healing: Secondary | ICD-10-CM | POA: Diagnosis not present

## 2015-02-21 DIAGNOSIS — M25512 Pain in left shoulder: Secondary | ICD-10-CM | POA: Diagnosis not present

## 2015-02-21 DIAGNOSIS — M6281 Muscle weakness (generalized): Secondary | ICD-10-CM | POA: Diagnosis not present

## 2015-02-21 DIAGNOSIS — M25612 Stiffness of left shoulder, not elsewhere classified: Secondary | ICD-10-CM | POA: Diagnosis not present

## 2015-02-26 DIAGNOSIS — M6281 Muscle weakness (generalized): Secondary | ICD-10-CM | POA: Diagnosis not present

## 2015-02-26 DIAGNOSIS — S42272D Torus fracture of upper end of left humerus, subsequent encounter for fracture with routine healing: Secondary | ICD-10-CM | POA: Diagnosis not present

## 2015-02-26 DIAGNOSIS — M25512 Pain in left shoulder: Secondary | ICD-10-CM | POA: Diagnosis not present

## 2015-02-26 DIAGNOSIS — M25612 Stiffness of left shoulder, not elsewhere classified: Secondary | ICD-10-CM | POA: Diagnosis not present

## 2015-02-28 DIAGNOSIS — M6281 Muscle weakness (generalized): Secondary | ICD-10-CM | POA: Diagnosis not present

## 2015-02-28 DIAGNOSIS — M25612 Stiffness of left shoulder, not elsewhere classified: Secondary | ICD-10-CM | POA: Diagnosis not present

## 2015-02-28 DIAGNOSIS — S42272D Torus fracture of upper end of left humerus, subsequent encounter for fracture with routine healing: Secondary | ICD-10-CM | POA: Diagnosis not present

## 2015-02-28 DIAGNOSIS — M25512 Pain in left shoulder: Secondary | ICD-10-CM | POA: Diagnosis not present

## 2015-03-07 DIAGNOSIS — M6281 Muscle weakness (generalized): Secondary | ICD-10-CM | POA: Diagnosis not present

## 2015-03-07 DIAGNOSIS — M25512 Pain in left shoulder: Secondary | ICD-10-CM | POA: Diagnosis not present

## 2015-03-07 DIAGNOSIS — M25612 Stiffness of left shoulder, not elsewhere classified: Secondary | ICD-10-CM | POA: Diagnosis not present

## 2015-03-07 DIAGNOSIS — S42272D Torus fracture of upper end of left humerus, subsequent encounter for fracture with routine healing: Secondary | ICD-10-CM | POA: Diagnosis not present

## 2015-03-12 DIAGNOSIS — M25512 Pain in left shoulder: Secondary | ICD-10-CM | POA: Diagnosis not present

## 2015-03-12 DIAGNOSIS — S42272D Torus fracture of upper end of left humerus, subsequent encounter for fracture with routine healing: Secondary | ICD-10-CM | POA: Diagnosis not present

## 2015-03-12 DIAGNOSIS — M25612 Stiffness of left shoulder, not elsewhere classified: Secondary | ICD-10-CM | POA: Diagnosis not present

## 2015-03-12 DIAGNOSIS — M6281 Muscle weakness (generalized): Secondary | ICD-10-CM | POA: Diagnosis not present

## 2015-03-14 DIAGNOSIS — S42272D Torus fracture of upper end of left humerus, subsequent encounter for fracture with routine healing: Secondary | ICD-10-CM | POA: Diagnosis not present

## 2015-03-14 DIAGNOSIS — M6281 Muscle weakness (generalized): Secondary | ICD-10-CM | POA: Diagnosis not present

## 2015-03-14 DIAGNOSIS — M25512 Pain in left shoulder: Secondary | ICD-10-CM | POA: Diagnosis not present

## 2015-03-14 DIAGNOSIS — M25612 Stiffness of left shoulder, not elsewhere classified: Secondary | ICD-10-CM | POA: Diagnosis not present

## 2015-03-19 DIAGNOSIS — M25512 Pain in left shoulder: Secondary | ICD-10-CM | POA: Diagnosis not present

## 2015-03-19 DIAGNOSIS — M25612 Stiffness of left shoulder, not elsewhere classified: Secondary | ICD-10-CM | POA: Diagnosis not present

## 2015-03-19 DIAGNOSIS — M6281 Muscle weakness (generalized): Secondary | ICD-10-CM | POA: Diagnosis not present

## 2015-03-19 DIAGNOSIS — S42272D Torus fracture of upper end of left humerus, subsequent encounter for fracture with routine healing: Secondary | ICD-10-CM | POA: Diagnosis not present

## 2015-03-26 DIAGNOSIS — M25512 Pain in left shoulder: Secondary | ICD-10-CM | POA: Diagnosis not present

## 2015-03-26 DIAGNOSIS — S42272D Torus fracture of upper end of left humerus, subsequent encounter for fracture with routine healing: Secondary | ICD-10-CM | POA: Diagnosis not present

## 2015-03-26 DIAGNOSIS — M6281 Muscle weakness (generalized): Secondary | ICD-10-CM | POA: Diagnosis not present

## 2015-03-26 DIAGNOSIS — M25612 Stiffness of left shoulder, not elsewhere classified: Secondary | ICD-10-CM | POA: Diagnosis not present

## 2015-04-06 ENCOUNTER — Other Ambulatory Visit: Payer: Self-pay | Admitting: Gastroenterology

## 2015-04-06 DIAGNOSIS — R1310 Dysphagia, unspecified: Secondary | ICD-10-CM

## 2015-04-06 DIAGNOSIS — K861 Other chronic pancreatitis: Secondary | ICD-10-CM | POA: Diagnosis not present

## 2015-04-06 DIAGNOSIS — Z86010 Personal history of colonic polyps: Secondary | ICD-10-CM | POA: Diagnosis not present

## 2015-04-06 DIAGNOSIS — R69 Illness, unspecified: Secondary | ICD-10-CM | POA: Diagnosis not present

## 2015-04-09 DIAGNOSIS — M25612 Stiffness of left shoulder, not elsewhere classified: Secondary | ICD-10-CM | POA: Diagnosis not present

## 2015-04-09 DIAGNOSIS — M6281 Muscle weakness (generalized): Secondary | ICD-10-CM | POA: Diagnosis not present

## 2015-04-09 DIAGNOSIS — M25512 Pain in left shoulder: Secondary | ICD-10-CM | POA: Diagnosis not present

## 2015-04-09 DIAGNOSIS — S42272D Torus fracture of upper end of left humerus, subsequent encounter for fracture with routine healing: Secondary | ICD-10-CM | POA: Diagnosis not present

## 2015-04-11 DIAGNOSIS — S42272D Torus fracture of upper end of left humerus, subsequent encounter for fracture with routine healing: Secondary | ICD-10-CM | POA: Diagnosis not present

## 2015-04-11 DIAGNOSIS — M25512 Pain in left shoulder: Secondary | ICD-10-CM | POA: Diagnosis not present

## 2015-04-11 DIAGNOSIS — M6281 Muscle weakness (generalized): Secondary | ICD-10-CM | POA: Diagnosis not present

## 2015-04-11 DIAGNOSIS — Z09 Encounter for follow-up examination after completed treatment for conditions other than malignant neoplasm: Secondary | ICD-10-CM | POA: Diagnosis not present

## 2015-04-11 DIAGNOSIS — M25612 Stiffness of left shoulder, not elsewhere classified: Secondary | ICD-10-CM | POA: Diagnosis not present

## 2015-04-13 ENCOUNTER — Ambulatory Visit
Admission: RE | Admit: 2015-04-13 | Discharge: 2015-04-13 | Disposition: A | Discharge status: Home / Self Care | Payer: Medicare HMO | Source: Ambulatory Visit | Attending: Gastroenterology | Admitting: Gastroenterology

## 2015-04-13 DIAGNOSIS — R1310 Dysphagia, unspecified: Secondary | ICD-10-CM | POA: Diagnosis not present

## 2015-04-13 DIAGNOSIS — R1013 Epigastric pain: Secondary | ICD-10-CM | POA: Diagnosis not present

## 2015-04-18 DIAGNOSIS — M25612 Stiffness of left shoulder, not elsewhere classified: Secondary | ICD-10-CM | POA: Diagnosis not present

## 2015-04-18 DIAGNOSIS — M25512 Pain in left shoulder: Secondary | ICD-10-CM | POA: Diagnosis not present

## 2015-04-18 DIAGNOSIS — M6281 Muscle weakness (generalized): Secondary | ICD-10-CM | POA: Diagnosis not present

## 2015-04-18 DIAGNOSIS — S42272D Torus fracture of upper end of left humerus, subsequent encounter for fracture with routine healing: Secondary | ICD-10-CM | POA: Diagnosis not present

## 2015-04-23 DIAGNOSIS — G8911 Acute pain due to trauma: Secondary | ICD-10-CM | POA: Diagnosis not present

## 2015-04-23 DIAGNOSIS — G8918 Other acute postprocedural pain: Secondary | ICD-10-CM | POA: Diagnosis not present

## 2015-04-23 DIAGNOSIS — M25512 Pain in left shoulder: Secondary | ICD-10-CM | POA: Diagnosis not present

## 2015-04-23 DIAGNOSIS — M25612 Stiffness of left shoulder, not elsewhere classified: Secondary | ICD-10-CM | POA: Diagnosis not present

## 2015-04-23 DIAGNOSIS — M6281 Muscle weakness (generalized): Secondary | ICD-10-CM | POA: Diagnosis not present

## 2015-04-23 DIAGNOSIS — S42272D Torus fracture of upper end of left humerus, subsequent encounter for fracture with routine healing: Secondary | ICD-10-CM | POA: Diagnosis not present

## 2015-04-25 DIAGNOSIS — M25612 Stiffness of left shoulder, not elsewhere classified: Secondary | ICD-10-CM | POA: Diagnosis not present

## 2015-04-25 DIAGNOSIS — M25512 Pain in left shoulder: Secondary | ICD-10-CM | POA: Diagnosis not present

## 2015-04-25 DIAGNOSIS — S42272D Torus fracture of upper end of left humerus, subsequent encounter for fracture with routine healing: Secondary | ICD-10-CM | POA: Diagnosis not present

## 2015-04-25 DIAGNOSIS — M6281 Muscle weakness (generalized): Secondary | ICD-10-CM | POA: Diagnosis not present

## 2015-05-08 DIAGNOSIS — M6281 Muscle weakness (generalized): Secondary | ICD-10-CM | POA: Diagnosis not present

## 2015-05-08 DIAGNOSIS — S42272D Torus fracture of upper end of left humerus, subsequent encounter for fracture with routine healing: Secondary | ICD-10-CM | POA: Diagnosis not present

## 2015-05-08 DIAGNOSIS — M25612 Stiffness of left shoulder, not elsewhere classified: Secondary | ICD-10-CM | POA: Diagnosis not present

## 2015-05-08 DIAGNOSIS — M25512 Pain in left shoulder: Secondary | ICD-10-CM | POA: Diagnosis not present

## 2015-05-09 DIAGNOSIS — Z1231 Encounter for screening mammogram for malignant neoplasm of breast: Secondary | ICD-10-CM | POA: Diagnosis not present

## 2015-05-09 DIAGNOSIS — M8589 Other specified disorders of bone density and structure, multiple sites: Secondary | ICD-10-CM | POA: Diagnosis not present

## 2015-05-10 DIAGNOSIS — S42272D Torus fracture of upper end of left humerus, subsequent encounter for fracture with routine healing: Secondary | ICD-10-CM | POA: Diagnosis not present

## 2015-05-10 DIAGNOSIS — M6281 Muscle weakness (generalized): Secondary | ICD-10-CM | POA: Diagnosis not present

## 2015-05-10 DIAGNOSIS — M25512 Pain in left shoulder: Secondary | ICD-10-CM | POA: Diagnosis not present

## 2015-05-10 DIAGNOSIS — M25612 Stiffness of left shoulder, not elsewhere classified: Secondary | ICD-10-CM | POA: Diagnosis not present

## 2015-05-11 DIAGNOSIS — R922 Inconclusive mammogram: Secondary | ICD-10-CM | POA: Diagnosis not present

## 2015-05-11 DIAGNOSIS — R928 Other abnormal and inconclusive findings on diagnostic imaging of breast: Secondary | ICD-10-CM | POA: Diagnosis not present

## 2015-05-15 DIAGNOSIS — M25512 Pain in left shoulder: Secondary | ICD-10-CM | POA: Diagnosis not present

## 2015-05-15 DIAGNOSIS — M6281 Muscle weakness (generalized): Secondary | ICD-10-CM | POA: Diagnosis not present

## 2015-05-15 DIAGNOSIS — S42272D Torus fracture of upper end of left humerus, subsequent encounter for fracture with routine healing: Secondary | ICD-10-CM | POA: Diagnosis not present

## 2015-05-15 DIAGNOSIS — M25612 Stiffness of left shoulder, not elsewhere classified: Secondary | ICD-10-CM | POA: Diagnosis not present

## 2015-05-17 DIAGNOSIS — M25512 Pain in left shoulder: Secondary | ICD-10-CM | POA: Diagnosis not present

## 2015-05-17 DIAGNOSIS — S42272D Torus fracture of upper end of left humerus, subsequent encounter for fracture with routine healing: Secondary | ICD-10-CM | POA: Diagnosis not present

## 2015-05-17 DIAGNOSIS — M25612 Stiffness of left shoulder, not elsewhere classified: Secondary | ICD-10-CM | POA: Diagnosis not present

## 2015-05-23 DIAGNOSIS — M25512 Pain in left shoulder: Secondary | ICD-10-CM | POA: Diagnosis not present

## 2015-05-23 DIAGNOSIS — M79642 Pain in left hand: Secondary | ICD-10-CM | POA: Diagnosis not present

## 2015-05-28 DIAGNOSIS — M25512 Pain in left shoulder: Secondary | ICD-10-CM | POA: Diagnosis not present

## 2015-05-28 DIAGNOSIS — M25612 Stiffness of left shoulder, not elsewhere classified: Secondary | ICD-10-CM | POA: Diagnosis not present

## 2015-05-28 DIAGNOSIS — S42272D Torus fracture of upper end of left humerus, subsequent encounter for fracture with routine healing: Secondary | ICD-10-CM | POA: Diagnosis not present

## 2015-05-28 DIAGNOSIS — M6281 Muscle weakness (generalized): Secondary | ICD-10-CM | POA: Diagnosis not present

## 2015-05-29 DIAGNOSIS — Z1321 Encounter for screening for nutritional disorder: Secondary | ICD-10-CM | POA: Diagnosis not present

## 2015-05-29 DIAGNOSIS — Z01419 Encounter for gynecological examination (general) (routine) without abnormal findings: Secondary | ICD-10-CM | POA: Diagnosis not present

## 2015-05-29 DIAGNOSIS — Z01411 Encounter for gynecological examination (general) (routine) with abnormal findings: Secondary | ICD-10-CM | POA: Diagnosis not present

## 2015-05-29 DIAGNOSIS — M810 Age-related osteoporosis without current pathological fracture: Secondary | ICD-10-CM | POA: Diagnosis not present

## 2015-05-30 DIAGNOSIS — S42272D Torus fracture of upper end of left humerus, subsequent encounter for fracture with routine healing: Secondary | ICD-10-CM | POA: Diagnosis not present

## 2015-05-30 DIAGNOSIS — M6281 Muscle weakness (generalized): Secondary | ICD-10-CM | POA: Diagnosis not present

## 2015-05-30 DIAGNOSIS — M25512 Pain in left shoulder: Secondary | ICD-10-CM | POA: Diagnosis not present

## 2015-05-30 DIAGNOSIS — M25612 Stiffness of left shoulder, not elsewhere classified: Secondary | ICD-10-CM | POA: Diagnosis not present

## 2015-06-01 DIAGNOSIS — M6281 Muscle weakness (generalized): Secondary | ICD-10-CM | POA: Diagnosis not present

## 2015-06-01 DIAGNOSIS — S42272D Torus fracture of upper end of left humerus, subsequent encounter for fracture with routine healing: Secondary | ICD-10-CM | POA: Diagnosis not present

## 2015-06-01 DIAGNOSIS — M25512 Pain in left shoulder: Secondary | ICD-10-CM | POA: Diagnosis not present

## 2015-06-01 DIAGNOSIS — M25612 Stiffness of left shoulder, not elsewhere classified: Secondary | ICD-10-CM | POA: Diagnosis not present

## 2015-06-04 DIAGNOSIS — M25612 Stiffness of left shoulder, not elsewhere classified: Secondary | ICD-10-CM | POA: Diagnosis not present

## 2015-06-04 DIAGNOSIS — M6281 Muscle weakness (generalized): Secondary | ICD-10-CM | POA: Diagnosis not present

## 2015-06-04 DIAGNOSIS — M25512 Pain in left shoulder: Secondary | ICD-10-CM | POA: Diagnosis not present

## 2015-06-04 DIAGNOSIS — S42272D Torus fracture of upper end of left humerus, subsequent encounter for fracture with routine healing: Secondary | ICD-10-CM | POA: Diagnosis not present

## 2015-06-05 DIAGNOSIS — M25512 Pain in left shoulder: Secondary | ICD-10-CM | POA: Diagnosis not present

## 2015-06-05 DIAGNOSIS — M25612 Stiffness of left shoulder, not elsewhere classified: Secondary | ICD-10-CM | POA: Diagnosis not present

## 2015-06-05 DIAGNOSIS — S42272D Torus fracture of upper end of left humerus, subsequent encounter for fracture with routine healing: Secondary | ICD-10-CM | POA: Diagnosis not present

## 2015-06-05 DIAGNOSIS — M6281 Muscle weakness (generalized): Secondary | ICD-10-CM | POA: Diagnosis not present

## 2015-06-15 DIAGNOSIS — M25512 Pain in left shoulder: Secondary | ICD-10-CM | POA: Diagnosis not present

## 2015-06-15 DIAGNOSIS — M6281 Muscle weakness (generalized): Secondary | ICD-10-CM | POA: Diagnosis not present

## 2015-06-15 DIAGNOSIS — S42272D Torus fracture of upper end of left humerus, subsequent encounter for fracture with routine healing: Secondary | ICD-10-CM | POA: Diagnosis not present

## 2015-06-15 DIAGNOSIS — M25612 Stiffness of left shoulder, not elsewhere classified: Secondary | ICD-10-CM | POA: Diagnosis not present

## 2015-06-21 DIAGNOSIS — S42272D Torus fracture of upper end of left humerus, subsequent encounter for fracture with routine healing: Secondary | ICD-10-CM | POA: Diagnosis not present

## 2015-06-21 DIAGNOSIS — M25612 Stiffness of left shoulder, not elsewhere classified: Secondary | ICD-10-CM | POA: Diagnosis not present

## 2015-06-21 DIAGNOSIS — M25512 Pain in left shoulder: Secondary | ICD-10-CM | POA: Diagnosis not present

## 2015-06-25 DIAGNOSIS — M6281 Muscle weakness (generalized): Secondary | ICD-10-CM | POA: Diagnosis not present

## 2015-06-25 DIAGNOSIS — M25612 Stiffness of left shoulder, not elsewhere classified: Secondary | ICD-10-CM | POA: Diagnosis not present

## 2015-06-25 DIAGNOSIS — S42272D Torus fracture of upper end of left humerus, subsequent encounter for fracture with routine healing: Secondary | ICD-10-CM | POA: Diagnosis not present

## 2015-06-25 DIAGNOSIS — M25512 Pain in left shoulder: Secondary | ICD-10-CM | POA: Diagnosis not present

## 2015-06-29 DIAGNOSIS — M25512 Pain in left shoulder: Secondary | ICD-10-CM | POA: Diagnosis not present

## 2015-06-29 DIAGNOSIS — S42272D Torus fracture of upper end of left humerus, subsequent encounter for fracture with routine healing: Secondary | ICD-10-CM | POA: Diagnosis not present

## 2015-06-29 DIAGNOSIS — M25612 Stiffness of left shoulder, not elsewhere classified: Secondary | ICD-10-CM | POA: Diagnosis not present

## 2015-06-29 DIAGNOSIS — M6281 Muscle weakness (generalized): Secondary | ICD-10-CM | POA: Diagnosis not present

## 2015-07-03 DIAGNOSIS — S42272D Torus fracture of upper end of left humerus, subsequent encounter for fracture with routine healing: Secondary | ICD-10-CM | POA: Diagnosis not present

## 2015-07-04 DIAGNOSIS — M25512 Pain in left shoulder: Secondary | ICD-10-CM | POA: Diagnosis not present

## 2015-07-06 DIAGNOSIS — M25612 Stiffness of left shoulder, not elsewhere classified: Secondary | ICD-10-CM | POA: Diagnosis not present

## 2015-07-06 DIAGNOSIS — M25512 Pain in left shoulder: Secondary | ICD-10-CM | POA: Diagnosis not present

## 2015-07-06 DIAGNOSIS — S42272D Torus fracture of upper end of left humerus, subsequent encounter for fracture with routine healing: Secondary | ICD-10-CM | POA: Diagnosis not present

## 2015-07-06 DIAGNOSIS — M6281 Muscle weakness (generalized): Secondary | ICD-10-CM | POA: Diagnosis not present

## 2015-07-17 DIAGNOSIS — G90512 Complex regional pain syndrome I of left upper limb: Secondary | ICD-10-CM | POA: Diagnosis not present

## 2015-07-25 DIAGNOSIS — G90512 Complex regional pain syndrome I of left upper limb: Secondary | ICD-10-CM | POA: Diagnosis not present

## 2015-07-26 DIAGNOSIS — J329 Chronic sinusitis, unspecified: Secondary | ICD-10-CM | POA: Diagnosis not present

## 2015-07-27 DIAGNOSIS — M6281 Muscle weakness (generalized): Secondary | ICD-10-CM | POA: Diagnosis not present

## 2015-07-27 DIAGNOSIS — M25512 Pain in left shoulder: Secondary | ICD-10-CM | POA: Diagnosis not present

## 2015-07-27 DIAGNOSIS — M25612 Stiffness of left shoulder, not elsewhere classified: Secondary | ICD-10-CM | POA: Diagnosis not present

## 2015-07-27 DIAGNOSIS — S42272D Torus fracture of upper end of left humerus, subsequent encounter for fracture with routine healing: Secondary | ICD-10-CM | POA: Diagnosis not present

## 2015-07-30 DIAGNOSIS — S42272D Torus fracture of upper end of left humerus, subsequent encounter for fracture with routine healing: Secondary | ICD-10-CM | POA: Diagnosis not present

## 2015-07-30 DIAGNOSIS — M25512 Pain in left shoulder: Secondary | ICD-10-CM | POA: Diagnosis not present

## 2015-07-30 DIAGNOSIS — M6281 Muscle weakness (generalized): Secondary | ICD-10-CM | POA: Diagnosis not present

## 2015-07-30 DIAGNOSIS — M25612 Stiffness of left shoulder, not elsewhere classified: Secondary | ICD-10-CM | POA: Diagnosis not present

## 2015-08-06 DIAGNOSIS — G90512 Complex regional pain syndrome I of left upper limb: Secondary | ICD-10-CM | POA: Diagnosis not present

## 2015-08-09 DIAGNOSIS — S42272D Torus fracture of upper end of left humerus, subsequent encounter for fracture with routine healing: Secondary | ICD-10-CM | POA: Diagnosis not present

## 2015-08-09 DIAGNOSIS — M6281 Muscle weakness (generalized): Secondary | ICD-10-CM | POA: Diagnosis not present

## 2015-08-09 DIAGNOSIS — M25612 Stiffness of left shoulder, not elsewhere classified: Secondary | ICD-10-CM | POA: Diagnosis not present

## 2015-08-09 DIAGNOSIS — M25512 Pain in left shoulder: Secondary | ICD-10-CM | POA: Diagnosis not present

## 2015-08-13 DIAGNOSIS — S42272D Torus fracture of upper end of left humerus, subsequent encounter for fracture with routine healing: Secondary | ICD-10-CM | POA: Diagnosis not present

## 2015-08-13 DIAGNOSIS — M25532 Pain in left wrist: Secondary | ICD-10-CM | POA: Diagnosis not present

## 2015-08-13 DIAGNOSIS — M25612 Stiffness of left shoulder, not elsewhere classified: Secondary | ICD-10-CM | POA: Diagnosis not present

## 2015-08-13 DIAGNOSIS — M25632 Stiffness of left wrist, not elsewhere classified: Secondary | ICD-10-CM | POA: Diagnosis not present

## 2015-08-16 DIAGNOSIS — Z86010 Personal history of colonic polyps: Secondary | ICD-10-CM | POA: Diagnosis not present

## 2015-08-16 DIAGNOSIS — R1310 Dysphagia, unspecified: Secondary | ICD-10-CM | POA: Diagnosis not present

## 2015-08-17 DIAGNOSIS — G90511 Complex regional pain syndrome I of right upper limb: Secondary | ICD-10-CM | POA: Diagnosis not present

## 2015-08-17 DIAGNOSIS — M25532 Pain in left wrist: Secondary | ICD-10-CM | POA: Diagnosis not present

## 2015-08-17 DIAGNOSIS — M25632 Stiffness of left wrist, not elsewhere classified: Secondary | ICD-10-CM | POA: Diagnosis not present

## 2015-08-17 DIAGNOSIS — G90512 Complex regional pain syndrome I of left upper limb: Secondary | ICD-10-CM | POA: Diagnosis not present

## 2015-08-17 DIAGNOSIS — S42272D Torus fracture of upper end of left humerus, subsequent encounter for fracture with routine healing: Secondary | ICD-10-CM | POA: Diagnosis not present

## 2015-08-20 DIAGNOSIS — M25532 Pain in left wrist: Secondary | ICD-10-CM | POA: Diagnosis not present

## 2015-08-20 DIAGNOSIS — M25632 Stiffness of left wrist, not elsewhere classified: Secondary | ICD-10-CM | POA: Diagnosis not present

## 2015-08-20 DIAGNOSIS — S42272D Torus fracture of upper end of left humerus, subsequent encounter for fracture with routine healing: Secondary | ICD-10-CM | POA: Diagnosis not present

## 2015-08-20 DIAGNOSIS — M25612 Stiffness of left shoulder, not elsewhere classified: Secondary | ICD-10-CM | POA: Diagnosis not present

## 2015-08-23 DIAGNOSIS — M25612 Stiffness of left shoulder, not elsewhere classified: Secondary | ICD-10-CM | POA: Diagnosis not present

## 2015-08-23 DIAGNOSIS — M25532 Pain in left wrist: Secondary | ICD-10-CM | POA: Diagnosis not present

## 2015-08-23 DIAGNOSIS — M25632 Stiffness of left wrist, not elsewhere classified: Secondary | ICD-10-CM | POA: Diagnosis not present

## 2015-08-23 DIAGNOSIS — S42272D Torus fracture of upper end of left humerus, subsequent encounter for fracture with routine healing: Secondary | ICD-10-CM | POA: Diagnosis not present

## 2015-08-28 DIAGNOSIS — M25532 Pain in left wrist: Secondary | ICD-10-CM | POA: Diagnosis not present

## 2015-08-28 DIAGNOSIS — M25612 Stiffness of left shoulder, not elsewhere classified: Secondary | ICD-10-CM | POA: Diagnosis not present

## 2015-08-28 DIAGNOSIS — S42272D Torus fracture of upper end of left humerus, subsequent encounter for fracture with routine healing: Secondary | ICD-10-CM | POA: Diagnosis not present

## 2015-08-28 DIAGNOSIS — M25632 Stiffness of left wrist, not elsewhere classified: Secondary | ICD-10-CM | POA: Diagnosis not present

## 2015-09-07 DIAGNOSIS — M25612 Stiffness of left shoulder, not elsewhere classified: Secondary | ICD-10-CM | POA: Diagnosis not present

## 2015-09-07 DIAGNOSIS — M25632 Stiffness of left wrist, not elsewhere classified: Secondary | ICD-10-CM | POA: Diagnosis not present

## 2015-09-07 DIAGNOSIS — S42272D Torus fracture of upper end of left humerus, subsequent encounter for fracture with routine healing: Secondary | ICD-10-CM | POA: Diagnosis not present

## 2015-09-07 DIAGNOSIS — M25532 Pain in left wrist: Secondary | ICD-10-CM | POA: Diagnosis not present

## 2015-09-11 DIAGNOSIS — M25612 Stiffness of left shoulder, not elsewhere classified: Secondary | ICD-10-CM | POA: Diagnosis not present

## 2015-09-11 DIAGNOSIS — M25532 Pain in left wrist: Secondary | ICD-10-CM | POA: Diagnosis not present

## 2015-09-11 DIAGNOSIS — S42272D Torus fracture of upper end of left humerus, subsequent encounter for fracture with routine healing: Secondary | ICD-10-CM | POA: Diagnosis not present

## 2015-09-21 DIAGNOSIS — M25632 Stiffness of left wrist, not elsewhere classified: Secondary | ICD-10-CM | POA: Diagnosis not present

## 2015-09-21 DIAGNOSIS — S42272D Torus fracture of upper end of left humerus, subsequent encounter for fracture with routine healing: Secondary | ICD-10-CM | POA: Diagnosis not present

## 2015-09-21 DIAGNOSIS — M25532 Pain in left wrist: Secondary | ICD-10-CM | POA: Diagnosis not present

## 2015-09-21 DIAGNOSIS — M25612 Stiffness of left shoulder, not elsewhere classified: Secondary | ICD-10-CM | POA: Diagnosis not present

## 2015-10-05 DIAGNOSIS — M25632 Stiffness of left wrist, not elsewhere classified: Secondary | ICD-10-CM | POA: Diagnosis not present

## 2015-10-05 DIAGNOSIS — S42272D Torus fracture of upper end of left humerus, subsequent encounter for fracture with routine healing: Secondary | ICD-10-CM | POA: Diagnosis not present

## 2015-10-05 DIAGNOSIS — M25612 Stiffness of left shoulder, not elsewhere classified: Secondary | ICD-10-CM | POA: Diagnosis not present

## 2015-10-05 DIAGNOSIS — M25532 Pain in left wrist: Secondary | ICD-10-CM | POA: Diagnosis not present

## 2015-10-09 DIAGNOSIS — M25632 Stiffness of left wrist, not elsewhere classified: Secondary | ICD-10-CM | POA: Diagnosis not present

## 2015-10-09 DIAGNOSIS — S42272D Torus fracture of upper end of left humerus, subsequent encounter for fracture with routine healing: Secondary | ICD-10-CM | POA: Diagnosis not present

## 2015-10-09 DIAGNOSIS — E7800 Pure hypercholesterolemia, unspecified: Secondary | ICD-10-CM | POA: Diagnosis not present

## 2015-10-09 DIAGNOSIS — M25532 Pain in left wrist: Secondary | ICD-10-CM | POA: Diagnosis not present

## 2015-10-09 DIAGNOSIS — H6123 Impacted cerumen, bilateral: Secondary | ICD-10-CM | POA: Diagnosis not present

## 2015-10-09 DIAGNOSIS — M25612 Stiffness of left shoulder, not elsewhere classified: Secondary | ICD-10-CM | POA: Diagnosis not present

## 2015-10-09 DIAGNOSIS — E559 Vitamin D deficiency, unspecified: Secondary | ICD-10-CM | POA: Diagnosis not present

## 2015-10-12 DIAGNOSIS — M25532 Pain in left wrist: Secondary | ICD-10-CM | POA: Diagnosis not present

## 2015-10-12 DIAGNOSIS — S42272D Torus fracture of upper end of left humerus, subsequent encounter for fracture with routine healing: Secondary | ICD-10-CM | POA: Diagnosis not present

## 2015-10-12 DIAGNOSIS — M25632 Stiffness of left wrist, not elsewhere classified: Secondary | ICD-10-CM | POA: Diagnosis not present

## 2015-10-12 DIAGNOSIS — M25612 Stiffness of left shoulder, not elsewhere classified: Secondary | ICD-10-CM | POA: Diagnosis not present

## 2015-10-15 DIAGNOSIS — M25632 Stiffness of left wrist, not elsewhere classified: Secondary | ICD-10-CM | POA: Diagnosis not present

## 2015-10-15 DIAGNOSIS — M25532 Pain in left wrist: Secondary | ICD-10-CM | POA: Diagnosis not present

## 2015-10-15 DIAGNOSIS — M25612 Stiffness of left shoulder, not elsewhere classified: Secondary | ICD-10-CM | POA: Diagnosis not present

## 2015-10-15 DIAGNOSIS — S42272D Torus fracture of upper end of left humerus, subsequent encounter for fracture with routine healing: Secondary | ICD-10-CM | POA: Diagnosis not present

## 2015-10-22 DIAGNOSIS — S42272D Torus fracture of upper end of left humerus, subsequent encounter for fracture with routine healing: Secondary | ICD-10-CM | POA: Diagnosis not present

## 2015-10-22 DIAGNOSIS — M25532 Pain in left wrist: Secondary | ICD-10-CM | POA: Diagnosis not present

## 2015-10-22 DIAGNOSIS — M25612 Stiffness of left shoulder, not elsewhere classified: Secondary | ICD-10-CM | POA: Diagnosis not present

## 2015-10-22 DIAGNOSIS — M25632 Stiffness of left wrist, not elsewhere classified: Secondary | ICD-10-CM | POA: Diagnosis not present

## 2015-10-24 DIAGNOSIS — S42272D Torus fracture of upper end of left humerus, subsequent encounter for fracture with routine healing: Secondary | ICD-10-CM | POA: Diagnosis not present

## 2015-10-24 DIAGNOSIS — M25612 Stiffness of left shoulder, not elsewhere classified: Secondary | ICD-10-CM | POA: Diagnosis not present

## 2015-10-24 DIAGNOSIS — M25632 Stiffness of left wrist, not elsewhere classified: Secondary | ICD-10-CM | POA: Diagnosis not present

## 2015-10-24 DIAGNOSIS — M25532 Pain in left wrist: Secondary | ICD-10-CM | POA: Diagnosis not present

## 2015-11-01 DIAGNOSIS — M25632 Stiffness of left wrist, not elsewhere classified: Secondary | ICD-10-CM | POA: Diagnosis not present

## 2015-11-01 DIAGNOSIS — S42272D Torus fracture of upper end of left humerus, subsequent encounter for fracture with routine healing: Secondary | ICD-10-CM | POA: Diagnosis not present

## 2015-11-01 DIAGNOSIS — M25612 Stiffness of left shoulder, not elsewhere classified: Secondary | ICD-10-CM | POA: Diagnosis not present

## 2015-11-01 DIAGNOSIS — M25532 Pain in left wrist: Secondary | ICD-10-CM | POA: Diagnosis not present

## 2015-11-06 DIAGNOSIS — S42272D Torus fracture of upper end of left humerus, subsequent encounter for fracture with routine healing: Secondary | ICD-10-CM | POA: Diagnosis not present

## 2015-11-06 DIAGNOSIS — M25632 Stiffness of left wrist, not elsewhere classified: Secondary | ICD-10-CM | POA: Diagnosis not present

## 2015-11-06 DIAGNOSIS — M25532 Pain in left wrist: Secondary | ICD-10-CM | POA: Diagnosis not present

## 2015-11-06 DIAGNOSIS — M25612 Stiffness of left shoulder, not elsewhere classified: Secondary | ICD-10-CM | POA: Diagnosis not present

## 2015-11-08 DIAGNOSIS — M25612 Stiffness of left shoulder, not elsewhere classified: Secondary | ICD-10-CM | POA: Diagnosis not present

## 2015-11-08 DIAGNOSIS — M25532 Pain in left wrist: Secondary | ICD-10-CM | POA: Diagnosis not present

## 2015-11-08 DIAGNOSIS — S42272D Torus fracture of upper end of left humerus, subsequent encounter for fracture with routine healing: Secondary | ICD-10-CM | POA: Diagnosis not present

## 2015-11-08 DIAGNOSIS — M25632 Stiffness of left wrist, not elsewhere classified: Secondary | ICD-10-CM | POA: Diagnosis not present

## 2015-11-16 DIAGNOSIS — M25532 Pain in left wrist: Secondary | ICD-10-CM | POA: Diagnosis not present

## 2015-11-16 DIAGNOSIS — S42272D Torus fracture of upper end of left humerus, subsequent encounter for fracture with routine healing: Secondary | ICD-10-CM | POA: Diagnosis not present

## 2015-11-16 DIAGNOSIS — M25612 Stiffness of left shoulder, not elsewhere classified: Secondary | ICD-10-CM | POA: Diagnosis not present

## 2015-11-16 DIAGNOSIS — M25632 Stiffness of left wrist, not elsewhere classified: Secondary | ICD-10-CM | POA: Diagnosis not present

## 2015-11-22 DIAGNOSIS — M25632 Stiffness of left wrist, not elsewhere classified: Secondary | ICD-10-CM | POA: Diagnosis not present

## 2015-11-22 DIAGNOSIS — M25532 Pain in left wrist: Secondary | ICD-10-CM | POA: Diagnosis not present

## 2015-11-22 DIAGNOSIS — S42272D Torus fracture of upper end of left humerus, subsequent encounter for fracture with routine healing: Secondary | ICD-10-CM | POA: Diagnosis not present

## 2015-11-22 DIAGNOSIS — M25612 Stiffness of left shoulder, not elsewhere classified: Secondary | ICD-10-CM | POA: Diagnosis not present

## 2015-11-26 DIAGNOSIS — M25612 Stiffness of left shoulder, not elsewhere classified: Secondary | ICD-10-CM | POA: Diagnosis not present

## 2015-11-26 DIAGNOSIS — M25532 Pain in left wrist: Secondary | ICD-10-CM | POA: Diagnosis not present

## 2015-11-26 DIAGNOSIS — S42272D Torus fracture of upper end of left humerus, subsequent encounter for fracture with routine healing: Secondary | ICD-10-CM | POA: Diagnosis not present

## 2015-11-26 DIAGNOSIS — M25632 Stiffness of left wrist, not elsewhere classified: Secondary | ICD-10-CM | POA: Diagnosis not present

## 2015-11-29 DIAGNOSIS — M25532 Pain in left wrist: Secondary | ICD-10-CM | POA: Diagnosis not present

## 2015-11-29 DIAGNOSIS — M25632 Stiffness of left wrist, not elsewhere classified: Secondary | ICD-10-CM | POA: Diagnosis not present

## 2015-11-29 DIAGNOSIS — M25612 Stiffness of left shoulder, not elsewhere classified: Secondary | ICD-10-CM | POA: Diagnosis not present

## 2015-11-29 DIAGNOSIS — S42272D Torus fracture of upper end of left humerus, subsequent encounter for fracture with routine healing: Secondary | ICD-10-CM | POA: Diagnosis not present

## 2015-12-10 DIAGNOSIS — M25612 Stiffness of left shoulder, not elsewhere classified: Secondary | ICD-10-CM | POA: Diagnosis not present

## 2015-12-10 DIAGNOSIS — M25632 Stiffness of left wrist, not elsewhere classified: Secondary | ICD-10-CM | POA: Diagnosis not present

## 2015-12-10 DIAGNOSIS — S42272D Torus fracture of upper end of left humerus, subsequent encounter for fracture with routine healing: Secondary | ICD-10-CM | POA: Diagnosis not present

## 2015-12-10 DIAGNOSIS — M25532 Pain in left wrist: Secondary | ICD-10-CM | POA: Diagnosis not present

## 2015-12-19 DIAGNOSIS — M25512 Pain in left shoulder: Secondary | ICD-10-CM | POA: Diagnosis not present

## 2016-01-23 DIAGNOSIS — M25532 Pain in left wrist: Secondary | ICD-10-CM | POA: Diagnosis not present

## 2016-01-23 DIAGNOSIS — M25612 Stiffness of left shoulder, not elsewhere classified: Secondary | ICD-10-CM | POA: Diagnosis not present

## 2016-01-23 DIAGNOSIS — S42272D Torus fracture of upper end of left humerus, subsequent encounter for fracture with routine healing: Secondary | ICD-10-CM | POA: Diagnosis not present

## 2016-01-23 DIAGNOSIS — M25632 Stiffness of left wrist, not elsewhere classified: Secondary | ICD-10-CM | POA: Diagnosis not present

## 2016-02-07 DIAGNOSIS — H2513 Age-related nuclear cataract, bilateral: Secondary | ICD-10-CM | POA: Diagnosis not present

## 2016-02-11 DIAGNOSIS — M25532 Pain in left wrist: Secondary | ICD-10-CM | POA: Diagnosis not present

## 2016-02-11 DIAGNOSIS — M25632 Stiffness of left wrist, not elsewhere classified: Secondary | ICD-10-CM | POA: Diagnosis not present

## 2016-02-11 DIAGNOSIS — S42272D Torus fracture of upper end of left humerus, subsequent encounter for fracture with routine healing: Secondary | ICD-10-CM | POA: Diagnosis not present

## 2016-02-11 DIAGNOSIS — M25612 Stiffness of left shoulder, not elsewhere classified: Secondary | ICD-10-CM | POA: Diagnosis not present

## 2016-03-19 DIAGNOSIS — M25532 Pain in left wrist: Secondary | ICD-10-CM | POA: Diagnosis not present

## 2016-03-19 DIAGNOSIS — M25612 Stiffness of left shoulder, not elsewhere classified: Secondary | ICD-10-CM | POA: Diagnosis not present

## 2016-03-19 DIAGNOSIS — S42272D Torus fracture of upper end of left humerus, subsequent encounter for fracture with routine healing: Secondary | ICD-10-CM | POA: Diagnosis not present

## 2016-03-19 DIAGNOSIS — M25632 Stiffness of left wrist, not elsewhere classified: Secondary | ICD-10-CM | POA: Diagnosis not present

## 2016-03-26 DIAGNOSIS — G5602 Carpal tunnel syndrome, left upper limb: Secondary | ICD-10-CM | POA: Diagnosis not present

## 2016-03-27 DIAGNOSIS — M25532 Pain in left wrist: Secondary | ICD-10-CM | POA: Diagnosis not present

## 2016-03-27 DIAGNOSIS — M25632 Stiffness of left wrist, not elsewhere classified: Secondary | ICD-10-CM | POA: Diagnosis not present

## 2016-03-27 DIAGNOSIS — S42272D Torus fracture of upper end of left humerus, subsequent encounter for fracture with routine healing: Secondary | ICD-10-CM | POA: Diagnosis not present

## 2016-03-27 DIAGNOSIS — M25612 Stiffness of left shoulder, not elsewhere classified: Secondary | ICD-10-CM | POA: Diagnosis not present

## 2016-03-31 DIAGNOSIS — M542 Cervicalgia: Secondary | ICD-10-CM | POA: Diagnosis not present

## 2016-03-31 DIAGNOSIS — Z716 Tobacco abuse counseling: Secondary | ICD-10-CM | POA: Diagnosis not present

## 2016-03-31 DIAGNOSIS — G5602 Carpal tunnel syndrome, left upper limb: Secondary | ICD-10-CM | POA: Diagnosis not present

## 2016-04-03 DIAGNOSIS — M25632 Stiffness of left wrist, not elsewhere classified: Secondary | ICD-10-CM | POA: Diagnosis not present

## 2016-04-03 DIAGNOSIS — M25612 Stiffness of left shoulder, not elsewhere classified: Secondary | ICD-10-CM | POA: Diagnosis not present

## 2016-04-03 DIAGNOSIS — S42272D Torus fracture of upper end of left humerus, subsequent encounter for fracture with routine healing: Secondary | ICD-10-CM | POA: Diagnosis not present

## 2016-04-03 DIAGNOSIS — M25532 Pain in left wrist: Secondary | ICD-10-CM | POA: Diagnosis not present

## 2016-04-04 DIAGNOSIS — G5602 Carpal tunnel syndrome, left upper limb: Secondary | ICD-10-CM | POA: Diagnosis not present

## 2016-04-08 DIAGNOSIS — H18413 Arcus senilis, bilateral: Secondary | ICD-10-CM | POA: Diagnosis not present

## 2016-04-08 DIAGNOSIS — H02839 Dermatochalasis of unspecified eye, unspecified eyelid: Secondary | ICD-10-CM | POA: Diagnosis not present

## 2016-04-08 DIAGNOSIS — H25013 Cortical age-related cataract, bilateral: Secondary | ICD-10-CM | POA: Diagnosis not present

## 2016-04-08 DIAGNOSIS — H2511 Age-related nuclear cataract, right eye: Secondary | ICD-10-CM | POA: Diagnosis not present

## 2016-04-08 DIAGNOSIS — H2513 Age-related nuclear cataract, bilateral: Secondary | ICD-10-CM | POA: Diagnosis not present

## 2016-04-11 DIAGNOSIS — M25612 Stiffness of left shoulder, not elsewhere classified: Secondary | ICD-10-CM | POA: Diagnosis not present

## 2016-04-11 DIAGNOSIS — S42272D Torus fracture of upper end of left humerus, subsequent encounter for fracture with routine healing: Secondary | ICD-10-CM | POA: Diagnosis not present

## 2016-04-11 DIAGNOSIS — M25632 Stiffness of left wrist, not elsewhere classified: Secondary | ICD-10-CM | POA: Diagnosis not present

## 2016-04-11 DIAGNOSIS — M25532 Pain in left wrist: Secondary | ICD-10-CM | POA: Diagnosis not present

## 2016-04-22 DIAGNOSIS — G5602 Carpal tunnel syndrome, left upper limb: Secondary | ICD-10-CM | POA: Diagnosis not present

## 2016-04-28 DIAGNOSIS — H2511 Age-related nuclear cataract, right eye: Secondary | ICD-10-CM | POA: Diagnosis not present

## 2016-04-29 DIAGNOSIS — H2512 Age-related nuclear cataract, left eye: Secondary | ICD-10-CM | POA: Diagnosis not present

## 2016-04-29 DIAGNOSIS — H2511 Age-related nuclear cataract, right eye: Secondary | ICD-10-CM | POA: Diagnosis not present

## 2016-04-29 DIAGNOSIS — Z961 Presence of intraocular lens: Secondary | ICD-10-CM | POA: Diagnosis not present

## 2016-04-30 DIAGNOSIS — Z9889 Other specified postprocedural states: Secondary | ICD-10-CM | POA: Diagnosis not present

## 2016-05-01 DIAGNOSIS — S6412XD Injury of median nerve at wrist and hand level of left arm, subsequent encounter: Secondary | ICD-10-CM | POA: Diagnosis not present

## 2016-05-19 DIAGNOSIS — H2512 Age-related nuclear cataract, left eye: Secondary | ICD-10-CM | POA: Diagnosis not present

## 2016-05-20 DIAGNOSIS — Z961 Presence of intraocular lens: Secondary | ICD-10-CM | POA: Diagnosis not present

## 2016-05-20 DIAGNOSIS — H2512 Age-related nuclear cataract, left eye: Secondary | ICD-10-CM | POA: Diagnosis not present

## 2016-05-22 DIAGNOSIS — S6412XD Injury of median nerve at wrist and hand level of left arm, subsequent encounter: Secondary | ICD-10-CM | POA: Diagnosis not present

## 2016-05-22 DIAGNOSIS — N6049 Mammary duct ectasia of unspecified breast: Secondary | ICD-10-CM | POA: Diagnosis not present

## 2016-05-28 DIAGNOSIS — M25512 Pain in left shoulder: Secondary | ICD-10-CM | POA: Diagnosis not present

## 2016-06-13 DIAGNOSIS — S6412XD Injury of median nerve at wrist and hand level of left arm, subsequent encounter: Secondary | ICD-10-CM | POA: Diagnosis not present

## 2016-06-25 DIAGNOSIS — Z01419 Encounter for gynecological examination (general) (routine) without abnormal findings: Secondary | ICD-10-CM | POA: Diagnosis not present

## 2016-07-04 DIAGNOSIS — R630 Anorexia: Secondary | ICD-10-CM | POA: Diagnosis not present

## 2016-07-04 DIAGNOSIS — E559 Vitamin D deficiency, unspecified: Secondary | ICD-10-CM | POA: Diagnosis not present

## 2016-07-04 DIAGNOSIS — R5383 Other fatigue: Secondary | ICD-10-CM | POA: Diagnosis not present

## 2016-07-04 DIAGNOSIS — S6412XD Injury of median nerve at wrist and hand level of left arm, subsequent encounter: Secondary | ICD-10-CM | POA: Diagnosis not present

## 2016-07-04 DIAGNOSIS — K219 Gastro-esophageal reflux disease without esophagitis: Secondary | ICD-10-CM | POA: Diagnosis not present

## 2016-07-04 DIAGNOSIS — G4700 Insomnia, unspecified: Secondary | ICD-10-CM | POA: Diagnosis not present

## 2016-07-23 DIAGNOSIS — M25512 Pain in left shoulder: Secondary | ICD-10-CM | POA: Diagnosis not present

## 2016-12-19 DIAGNOSIS — K219 Gastro-esophageal reflux disease without esophagitis: Secondary | ICD-10-CM | POA: Diagnosis not present

## 2016-12-19 DIAGNOSIS — R143 Flatulence: Secondary | ICD-10-CM | POA: Diagnosis not present

## 2016-12-19 DIAGNOSIS — R198 Other specified symptoms and signs involving the digestive system and abdomen: Secondary | ICD-10-CM | POA: Diagnosis not present

## 2016-12-19 DIAGNOSIS — R1310 Dysphagia, unspecified: Secondary | ICD-10-CM | POA: Diagnosis not present

## 2016-12-19 DIAGNOSIS — R69 Illness, unspecified: Secondary | ICD-10-CM | POA: Diagnosis not present

## 2017-02-12 DIAGNOSIS — J209 Acute bronchitis, unspecified: Secondary | ICD-10-CM | POA: Diagnosis not present

## 2017-02-12 DIAGNOSIS — R030 Elevated blood-pressure reading, without diagnosis of hypertension: Secondary | ICD-10-CM | POA: Diagnosis not present

## 2017-02-26 DIAGNOSIS — L720 Epidermal cyst: Secondary | ICD-10-CM | POA: Diagnosis not present

## 2017-02-26 DIAGNOSIS — D2271 Melanocytic nevi of right lower limb, including hip: Secondary | ICD-10-CM | POA: Diagnosis not present

## 2017-02-26 DIAGNOSIS — L821 Other seborrheic keratosis: Secondary | ICD-10-CM | POA: Diagnosis not present

## 2017-02-26 DIAGNOSIS — D225 Melanocytic nevi of trunk: Secondary | ICD-10-CM | POA: Diagnosis not present

## 2017-02-26 DIAGNOSIS — D2372 Other benign neoplasm of skin of left lower limb, including hip: Secondary | ICD-10-CM | POA: Diagnosis not present

## 2017-02-26 DIAGNOSIS — D1801 Hemangioma of skin and subcutaneous tissue: Secondary | ICD-10-CM | POA: Diagnosis not present

## 2017-02-26 DIAGNOSIS — L509 Urticaria, unspecified: Secondary | ICD-10-CM | POA: Diagnosis not present

## 2017-02-26 DIAGNOSIS — D2262 Melanocytic nevi of left upper limb, including shoulder: Secondary | ICD-10-CM | POA: Diagnosis not present

## 2017-02-26 DIAGNOSIS — D2261 Melanocytic nevi of right upper limb, including shoulder: Secondary | ICD-10-CM | POA: Diagnosis not present

## 2017-02-26 DIAGNOSIS — D2272 Melanocytic nevi of left lower limb, including hip: Secondary | ICD-10-CM | POA: Diagnosis not present

## 2017-03-20 DIAGNOSIS — J0100 Acute maxillary sinusitis, unspecified: Secondary | ICD-10-CM | POA: Diagnosis not present

## 2017-04-03 DIAGNOSIS — K219 Gastro-esophageal reflux disease without esophagitis: Secondary | ICD-10-CM | POA: Diagnosis not present

## 2017-04-03 DIAGNOSIS — R1310 Dysphagia, unspecified: Secondary | ICD-10-CM | POA: Diagnosis not present

## 2017-04-03 DIAGNOSIS — R143 Flatulence: Secondary | ICD-10-CM | POA: Diagnosis not present

## 2017-04-03 DIAGNOSIS — R198 Other specified symptoms and signs involving the digestive system and abdomen: Secondary | ICD-10-CM | POA: Diagnosis not present

## 2017-04-03 DIAGNOSIS — R69 Illness, unspecified: Secondary | ICD-10-CM | POA: Diagnosis not present

## 2017-04-07 DIAGNOSIS — H04123 Dry eye syndrome of bilateral lacrimal glands: Secondary | ICD-10-CM | POA: Diagnosis not present

## 2017-04-28 DIAGNOSIS — H04123 Dry eye syndrome of bilateral lacrimal glands: Secondary | ICD-10-CM | POA: Diagnosis not present

## 2017-09-09 DIAGNOSIS — H16223 Keratoconjunctivitis sicca, not specified as Sjogren's, bilateral: Secondary | ICD-10-CM | POA: Diagnosis not present

## 2017-10-19 DIAGNOSIS — H1031 Unspecified acute conjunctivitis, right eye: Secondary | ICD-10-CM | POA: Diagnosis not present

## 2017-10-28 DIAGNOSIS — E559 Vitamin D deficiency, unspecified: Secondary | ICD-10-CM | POA: Diagnosis not present

## 2017-10-28 DIAGNOSIS — R0789 Other chest pain: Secondary | ICD-10-CM | POA: Diagnosis not present

## 2017-10-28 DIAGNOSIS — R030 Elevated blood-pressure reading, without diagnosis of hypertension: Secondary | ICD-10-CM | POA: Diagnosis not present

## 2017-10-28 DIAGNOSIS — E7800 Pure hypercholesterolemia, unspecified: Secondary | ICD-10-CM | POA: Diagnosis not present

## 2017-11-04 DIAGNOSIS — R69 Illness, unspecified: Secondary | ICD-10-CM | POA: Diagnosis not present

## 2017-11-04 DIAGNOSIS — R0789 Other chest pain: Secondary | ICD-10-CM | POA: Diagnosis not present

## 2017-11-04 DIAGNOSIS — R0609 Other forms of dyspnea: Secondary | ICD-10-CM | POA: Diagnosis not present

## 2017-11-04 DIAGNOSIS — Z720 Tobacco use: Secondary | ICD-10-CM | POA: Diagnosis not present

## 2017-11-09 DIAGNOSIS — R0789 Other chest pain: Secondary | ICD-10-CM | POA: Diagnosis not present

## 2017-11-09 DIAGNOSIS — R0602 Shortness of breath: Secondary | ICD-10-CM | POA: Diagnosis not present

## 2017-11-25 DIAGNOSIS — R0609 Other forms of dyspnea: Secondary | ICD-10-CM | POA: Diagnosis not present

## 2017-11-25 DIAGNOSIS — R0989 Other specified symptoms and signs involving the circulatory and respiratory systems: Secondary | ICD-10-CM | POA: Diagnosis not present

## 2017-12-09 DIAGNOSIS — R0609 Other forms of dyspnea: Secondary | ICD-10-CM | POA: Diagnosis not present

## 2017-12-09 DIAGNOSIS — I10 Essential (primary) hypertension: Secondary | ICD-10-CM | POA: Diagnosis not present

## 2017-12-09 DIAGNOSIS — R0789 Other chest pain: Secondary | ICD-10-CM | POA: Diagnosis not present

## 2017-12-09 DIAGNOSIS — R69 Illness, unspecified: Secondary | ICD-10-CM | POA: Diagnosis not present

## 2018-02-03 HISTORY — PX: EYE SURGERY: SHX253

## 2018-02-04 DIAGNOSIS — I10 Essential (primary) hypertension: Secondary | ICD-10-CM | POA: Diagnosis not present

## 2018-02-04 DIAGNOSIS — R0789 Other chest pain: Secondary | ICD-10-CM | POA: Diagnosis not present

## 2018-02-12 DIAGNOSIS — R0789 Other chest pain: Secondary | ICD-10-CM | POA: Diagnosis not present

## 2018-02-12 DIAGNOSIS — R69 Illness, unspecified: Secondary | ICD-10-CM | POA: Diagnosis not present

## 2018-02-12 DIAGNOSIS — I10 Essential (primary) hypertension: Secondary | ICD-10-CM | POA: Diagnosis not present

## 2018-02-12 DIAGNOSIS — E875 Hyperkalemia: Secondary | ICD-10-CM | POA: Diagnosis not present

## 2018-02-18 DIAGNOSIS — R739 Hyperglycemia, unspecified: Secondary | ICD-10-CM | POA: Diagnosis not present

## 2018-04-14 DIAGNOSIS — D2271 Melanocytic nevi of right lower limb, including hip: Secondary | ICD-10-CM | POA: Diagnosis not present

## 2018-04-14 DIAGNOSIS — L259 Unspecified contact dermatitis, unspecified cause: Secondary | ICD-10-CM | POA: Diagnosis not present

## 2018-04-14 DIAGNOSIS — D2272 Melanocytic nevi of left lower limb, including hip: Secondary | ICD-10-CM | POA: Diagnosis not present

## 2018-04-14 DIAGNOSIS — D2372 Other benign neoplasm of skin of left lower limb, including hip: Secondary | ICD-10-CM | POA: Diagnosis not present

## 2018-04-14 DIAGNOSIS — D1801 Hemangioma of skin and subcutaneous tissue: Secondary | ICD-10-CM | POA: Diagnosis not present

## 2018-04-14 DIAGNOSIS — D485 Neoplasm of uncertain behavior of skin: Secondary | ICD-10-CM | POA: Diagnosis not present

## 2018-04-14 DIAGNOSIS — D225 Melanocytic nevi of trunk: Secondary | ICD-10-CM | POA: Diagnosis not present

## 2018-04-14 DIAGNOSIS — L821 Other seborrheic keratosis: Secondary | ICD-10-CM | POA: Diagnosis not present

## 2018-04-14 DIAGNOSIS — L820 Inflamed seborrheic keratosis: Secondary | ICD-10-CM | POA: Diagnosis not present

## 2018-04-14 DIAGNOSIS — L814 Other melanin hyperpigmentation: Secondary | ICD-10-CM | POA: Diagnosis not present

## 2018-04-20 DIAGNOSIS — Z008 Encounter for other general examination: Secondary | ICD-10-CM | POA: Diagnosis not present

## 2018-04-20 DIAGNOSIS — L931 Subacute cutaneous lupus erythematosus: Secondary | ICD-10-CM | POA: Diagnosis not present

## 2018-04-22 DIAGNOSIS — E559 Vitamin D deficiency, unspecified: Secondary | ICD-10-CM | POA: Diagnosis not present

## 2018-04-22 DIAGNOSIS — E875 Hyperkalemia: Secondary | ICD-10-CM | POA: Diagnosis not present

## 2018-04-22 DIAGNOSIS — K219 Gastro-esophageal reflux disease without esophagitis: Secondary | ICD-10-CM | POA: Diagnosis not present

## 2018-04-22 DIAGNOSIS — R69 Illness, unspecified: Secondary | ICD-10-CM | POA: Diagnosis not present

## 2018-04-22 DIAGNOSIS — H04201 Unspecified epiphora, right lacrimal gland: Secondary | ICD-10-CM | POA: Diagnosis not present

## 2018-04-22 DIAGNOSIS — E7800 Pure hypercholesterolemia, unspecified: Secondary | ICD-10-CM | POA: Diagnosis not present

## 2018-04-22 DIAGNOSIS — I10 Essential (primary) hypertension: Secondary | ICD-10-CM | POA: Diagnosis not present

## 2018-04-29 DIAGNOSIS — H04551 Acquired stenosis of right nasolacrimal duct: Secondary | ICD-10-CM | POA: Diagnosis not present

## 2018-05-07 ENCOUNTER — Other Ambulatory Visit: Payer: Self-pay | Admitting: Cardiology

## 2018-06-07 ENCOUNTER — Other Ambulatory Visit: Payer: Self-pay

## 2018-06-07 DIAGNOSIS — E7849 Other hyperlipidemia: Secondary | ICD-10-CM

## 2018-06-07 MED ORDER — ATORVASTATIN CALCIUM 20 MG PO TABS: 20.0000 mg | ORAL_TABLET | Freq: Every day | ORAL | 3 refills | Status: DC

## 2018-06-10 ENCOUNTER — Other Ambulatory Visit: Payer: Self-pay | Admitting: Cardiology

## 2018-06-10 DIAGNOSIS — I10 Essential (primary) hypertension: Secondary | ICD-10-CM | POA: Diagnosis not present

## 2018-06-10 DIAGNOSIS — E875 Hyperkalemia: Secondary | ICD-10-CM | POA: Diagnosis not present

## 2018-06-11 LAB — COMPREHENSIVE METABOLIC PANEL
ALT: 26 IU/L (ref 0–32)
AST: 37 IU/L (ref 0–40)
Albumin/Globulin Ratio: 1.7 (ref 1.2–2.2)
Albumin: 4.6 g/dL (ref 3.8–4.8)
Alkaline Phosphatase: 66 IU/L (ref 39–117)
BUN/Creatinine Ratio: 12 (ref 12–28)
BUN: 9 mg/dL (ref 8–27)
Bilirubin Total: 0.6 mg/dL (ref 0.0–1.2)
CO2: 22 mmol/L (ref 20–29)
Calcium: 10.2 mg/dL (ref 8.7–10.3)
Chloride: 97 mmol/L (ref 96–106)
Creatinine, Ser: 0.78 mg/dL (ref 0.57–1.00)
GFR calc Af Amer: 94 mL/min/{1.73_m2} (ref >59)
GFR calc non Af Amer: 82 mL/min/{1.73_m2} (ref >59)
Globulin, Total: 2.7 g/dL (ref 1.5–4.5)
Glucose: 103 mg/dL — ABNORMAL HIGH (ref 65–99)
Potassium: 4.3 mmol/L (ref 3.5–5.2)
Sodium: 134 mmol/L (ref 134–144)
Total Protein: 7.3 g/dL (ref 6.0–8.5)

## 2018-06-11 LAB — LIPID PANEL W/O CHOL/HDL RATIO
Cholesterol, Total: 164 mg/dL (ref 100–199)
HDL: 80 mg/dL (ref >39)
LDL Calculated: 65 mg/dL (ref 0–99)
Triglycerides: 94 mg/dL (ref 0–149)
VLDL Cholesterol Cal: 19 mg/dL (ref 5–40)

## 2018-06-23 ENCOUNTER — Other Ambulatory Visit: Payer: Self-pay | Admitting: Cardiology

## 2018-06-23 NOTE — Telephone Encounter (Signed)
Please fill

## 2018-06-30 DIAGNOSIS — H04551 Acquired stenosis of right nasolacrimal duct: Secondary | ICD-10-CM | POA: Diagnosis not present

## 2018-07-26 ENCOUNTER — Other Ambulatory Visit: Payer: Self-pay | Admitting: Cardiology

## 2018-07-26 NOTE — Telephone Encounter (Signed)
Please fill

## 2018-08-04 DIAGNOSIS — R69 Illness, unspecified: Secondary | ICD-10-CM | POA: Diagnosis not present

## 2018-08-04 DIAGNOSIS — R1310 Dysphagia, unspecified: Secondary | ICD-10-CM | POA: Diagnosis not present

## 2018-08-11 ENCOUNTER — Encounter: Payer: Self-pay | Admitting: Neurology

## 2018-08-16 ENCOUNTER — Ambulatory Visit (INDEPENDENT_AMBULATORY_CARE_PROVIDER_SITE_OTHER): Payer: Medicare HMO | Admitting: Neurology

## 2018-08-16 ENCOUNTER — Encounter: Payer: Self-pay | Admitting: Neurology

## 2018-08-16 ENCOUNTER — Other Ambulatory Visit: Payer: Self-pay

## 2018-08-16 VITALS — BP 137/77 | HR 89 | Temp 98.4°F | Ht 66.0 in | Wt 124.0 lb

## 2018-08-16 DIAGNOSIS — R69 Illness, unspecified: Secondary | ICD-10-CM | POA: Diagnosis not present

## 2018-08-16 DIAGNOSIS — F17200 Nicotine dependence, unspecified, uncomplicated: Secondary | ICD-10-CM

## 2018-08-16 DIAGNOSIS — G47411 Narcolepsy with cataplexy: Secondary | ICD-10-CM | POA: Diagnosis not present

## 2018-08-16 DIAGNOSIS — F5104 Psychophysiologic insomnia: Secondary | ICD-10-CM | POA: Diagnosis not present

## 2018-08-16 DIAGNOSIS — F32A Depression, unspecified: Secondary | ICD-10-CM

## 2018-08-16 DIAGNOSIS — F329 Major depressive disorder, single episode, unspecified: Secondary | ICD-10-CM

## 2018-08-16 DIAGNOSIS — R0683 Snoring: Secondary | ICD-10-CM

## 2018-08-16 DIAGNOSIS — M810 Age-related osteoporosis without current pathological fracture: Secondary | ICD-10-CM

## 2018-08-16 DIAGNOSIS — R4189 Other symptoms and signs involving cognitive functions and awareness: Secondary | ICD-10-CM | POA: Diagnosis not present

## 2018-08-16 DIAGNOSIS — G4719 Other hypersomnia: Secondary | ICD-10-CM | POA: Diagnosis not present

## 2018-08-16 DIAGNOSIS — F419 Anxiety disorder, unspecified: Secondary | ICD-10-CM

## 2018-08-16 NOTE — Progress Notes (Signed)
SLEEP MEDICINE CLINIC    Provider:  Larey Seat, MD  Primary Care Physician:  Hulan Fess, MD [place] [address]     Referring Provider: Dr Toy Care.         Chief Complaint according to patient   Patient presents with:    . New Patient (Initial Visit)           HISTORY OF PRESENT ILLNESS:  Gabriella Farrell is a 63 y.o. year old White or Caucasian female patient seen here as a referral on 08/16/2018 from Dr. Toy Care.    I have the pleasure of seeing Gabriella Farrell today, a right-handed White or Caucasian female with a possible sleep disorder.  She has a  has a past medical history of Depression.. She has osteoporosis.  Suffered a dislocated shoulder in a fall over 3 years ago. She has ongoing nerve pain in the affected left upper extremity.     Family medical /sleep history: daughter has been evaluated for EDS- takes Nuvigil.  Several other family member  with insomnia,  Her daughter Gabriella Farrell is also a sleep walker.    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  The patient currently works part time from home; Tobacco use: 3/4 pack per day smoker, over 40 years.    ETOH use  In form of G and T and wine 3-4 a week. ,  Caffeine intake in form of Coffee( 1 a day) Soda( none) Tea ( none) or energy drinks. No regular exercise since corona lock down, but gardening and walking.      Sleep habits are as follows: The patient's dinner time is between 7 - 8PM. The patient goes to bed at 9-11  PM and  Has trouble to quit thinking, is always worried. She relies on xanax  To fall asleep- continues to sleep for 1-2 hours. She is up a lot until 6 AM- when she goes into a very heavy and deep sleep. That's when she dreams, vivid, with a sense of doom.  She  wakes for 1-2 bathroom breaks.   The preferred sleep position is supine , with the support of one   pillow. Dreams are reportedly frequent/vivid.  The patient wakes up spontaneously/ 2-3- 4 AM , in 45 minute intervals. 10  AM is the usual rise time. She reports not feeling refreshed or restored in AM, with symptoms such as dry mouth, morning headaches and the feeling of not having slept and a high degree of residual fatigue. Naps are taken frequently, lasting from 30 to 60 minutes and are more refreshing than nocturnal sleep.    Review of Systems: Out of a complete 14 system review, the patient complains of only the following symptoms, and all other reviewed systems are negative.:  Fatigue, sleepiness , snoring, fragmented sleep, Insomnia - chronic.   Excessive daytime sleepy, morning paralysis, snoring, dropping objects in times of  emotional responses. Possible cataplexy.    How likely are you to doze in the following situations: 0 = not likely, 1 = slight chance, 2 = moderate chance, 3 = high chance   Sitting and Reading? Watching Television? Sitting inactive in a public place (theater or meeting)? As a passenger in a car for an hour without a break? Lying down in the afternoon when circumstances permit? Sitting and talking to someone? Sitting  quietly after lunch without alcohol? In a car, while stopped for a few minutes in traffic?   Total = 16/ 24 points   FSS endorsed at 41/ 63 points.    She used Nuvigil brand for many years and was changed last year to generic, developed palpitations, returned to brand name, added Cymbalta and reduced the prozac.   Social History   Socioeconomic History  . Marital status: Married    Spouse name: Not on file  . Number of children: Not on file  . Years of education: Not on file  . Highest education level: Not on file  Occupational History  . Not on file  Social Needs  . Financial resource strain: Not on file  . Food insecurity    Worry: Not on file    Inability: Not on file  . Transportation needs    Medical: Not on file     Non-medical: Not on file  Tobacco Use  . Smoking status: Current Every Day Smoker    Packs/day: 0.50    Types: Cigarettes  . Smokeless tobacco: Never Used  Substance and Sexual Activity  . Alcohol use: Yes    Alcohol/week: 14.0 standard drinks    Types: 14 Standard drinks or equivalent per week    Comment: daily  . Drug use: No  . Sexual activity: Not on file  Lifestyle  . Physical activity    Days per week: Not on file    Minutes per session: Not on file  . Stress: Not on file  Relationships  . Social Herbalist on phone: Not on file    Gets together: Not on file    Attends religious service: Not on file    Active member of club or organization: Not on file    Attends meetings of clubs or organizations: Not on file    Relationship status: Not on file  Other Topics Concern  . Not on file  Social History Narrative  . Not on file   Past Medical History:  Diagnosis Date  . Depression     Past Surgical History:  Procedure Laterality Date  . CHOLECYSTECTOMY    . PANCREAS SURGERY    . SHOULDER SURGERY       Current Outpatient Medications on File Prior to Visit  Medication Sig Dispense Refill  . ALPRAZolam (XANAX) 0.5 MG tablet Take 0.5 mg by mouth every 6 (six) hours as needed for anxiety.    Marland Kitchen amLODipine (NORVASC) 5 MG tablet Take 1 tablet by mouth once daily 90 tablet 3  . Armodafinil (NUVIGIL) 250 MG tablet Take 250 mg by mouth daily.    Marland Kitchen atorvastatin (LIPITOR) 20 MG tablet Take 1 tablet (20 mg total) by mouth daily. 90 tablet 3  . Calcium-Magnesium-Vitamin D (CALCIUM 1200+D3 PO) Take 1 capsule by mouth daily.    . DULoxetine (CYMBALTA) 60 MG capsule     . FLUoxetine (PROZAC) 20 MG tablet Take 20 mg by mouth daily.     . Multiple Vitamin (MULTIVITAMIN) capsule Take 1 capsule by mouth daily.    . pantoprazole (PROTONIX) 40 MG tablet Take 40 mg by mouth daily.     No current facility-administered medications on file prior to visit.     No Known  Allergies  Physical exam:  Today's Vitals   08/16/18 1254  BP: 137/77  Pulse: 89  Temp: 98.4 F (36.9 C)  Weight: 124 lb (56.2 kg)  Height: 5\' 6"  (1.676 m)   Body  mass index is 20.01 kg/m.   Wt Readings from Last 3 Encounters:  08/16/18 124 lb (56.2 kg)  02/02/15 132 lb (59.9 kg)     Ht Readings from Last 3 Encounters:  08/16/18 5\' 6"  (1.676 m)  02/02/15 5\' 6"  (1.676 m)      General: The patient is awake, alert and appears not in acute distress. The patient is well groomed but appears older than 20. Head: Normocephalic, atraumatic. Neck is supple. Mallampati 3,  neck circumference:14  inches . Nasal airflow congested- but today  patent.  Retrognathia is not  seen.  Dental status: full upper denture.  Cardiovascular:  Regular rate and cardiac rhythm by pulse,  without distended neck veins. Respiratory: Lungs are clear to auscultation.  Skin:  Without evidence of ankle edema, or rash. Trunk: The patient's posture is erect.   Neurologic exam : The patient is awake and alert, oriented to place and time.   Memory subjective described as intact.  Attention span & concentration ability appears normal.  Speech is fluent,  without  dysarthria, but with dysphonia.  Mood and affect are appropriate.   Cranial nerves: no loss of smell or taste reported.  Pupils are equal and briskly reactive to light. Funduscopic exam deferred. Status post cataract surgery bilateraly tearduct inflammation noted.  Marland Kitchen  Extraocular movements in vertical and horizontal planes were smooth except for the central overlaping part- there is bobbing - Visual fields by finger perimetry are intact. Hearing was intact to soft voice and finger rubbing.    Facial sensation intact to fine touch.  Facial motor strength is symmetric and tongue and uvula move midline.  Neck ROM : rotation, tilt and flexion extension were normal for age and shoulder shrug is affected , there is a large scar over the left .    Motor  exam:  Symmetric bulk, tone and ROM.   she has notable loss of muscle mass in the left thenar eminence, carpal tunnel.  Normal tone without cog wheeling, symmetric grip strength .   Sensory:  Fine touch, pinprick and vibration were tested  and  normal.  Proprioception tested in the upper extremities was normal.  Coordination: Rapid alternating movements in the fingers/hands were of normal speed.  The Finger-to-nose maneuver was intact without evidence of ataxia, dysmetria or tremor.no changes in penmanship.  Gait and station: Patient could rise unassisted from a seated position, walked without assistive device.  Stance is of normal width/ base and the patient turned with 3 steps.  Toe and heel walk were deferred.  Deep tendon reflexes: in the  upper and lower extremities are symmetric and intact.  Babinski response was deferred.       After spending a total time of  40 minutes face to face and additional time for physical and neurologic examination, review of laboratory studies,  personal review of imaging studies, reports and results of other testing and review of referral information / records as far as provided in visit, I have established the following assessments:  1)  Patient with a longstanding history of insomnia which changed to hypersomnia and chronic fatigue over a period of 5 years- she took care of her parents in law and she was often at the end of her strength.   2) depression, anxiety grew in that time frame, social anxieties arose, she couldn't go out. Her mind was always racing, worried.   3) her longstanding tobacco use is complicating this - there is a chance of OSA, and  witnessed reported. snoring.    My Plan is to proceed with:  1)  REM sleep related vivid dreams with sense of doom, hypoxemia and OSA rule out . In insomnia evaluation.   Start with attended sleep study    I would like to thank Hulan Fess, MD and Hulan Fess, [address] [place],   [place] [address] for allowing me to meet with and to take care of this pleasant patient.   In short, Gabriella Farrell is presenting with insomnia and fatigue , a symptom that can be attributed to depression and anxiety.  I plan to follow up either personally or through our NP within 2-3 month.    Electronically signed by: Larey Seat, MD 08/16/2018 1:14 PM  [facility] [place] certified by The [employer] of Sleep Medicine and Diplomate of the [employer] of Sleep Medicine. Board certified In Neurology through the [employer], Fellow of the [employer] of Neurology. Medical Director of [employer].

## 2018-08-25 DIAGNOSIS — Z20828 Contact with and (suspected) exposure to other viral communicable diseases: Secondary | ICD-10-CM | POA: Diagnosis not present

## 2018-08-25 LAB — NARCOLEPSY EVALUATION
DQA1*01:02: POSITIVE
DQB1*06:02: POSITIVE

## 2018-09-13 ENCOUNTER — Ambulatory Visit (INDEPENDENT_AMBULATORY_CARE_PROVIDER_SITE_OTHER): Payer: Medicare HMO | Admitting: Neurology

## 2018-09-13 DIAGNOSIS — F329 Major depressive disorder, single episode, unspecified: Secondary | ICD-10-CM

## 2018-09-13 DIAGNOSIS — G4719 Other hypersomnia: Secondary | ICD-10-CM

## 2018-09-13 DIAGNOSIS — G4733 Obstructive sleep apnea (adult) (pediatric): Secondary | ICD-10-CM | POA: Diagnosis not present

## 2018-09-13 DIAGNOSIS — F17200 Nicotine dependence, unspecified, uncomplicated: Secondary | ICD-10-CM

## 2018-09-13 DIAGNOSIS — M810 Age-related osteoporosis without current pathological fracture: Secondary | ICD-10-CM

## 2018-09-13 DIAGNOSIS — F419 Anxiety disorder, unspecified: Secondary | ICD-10-CM

## 2018-09-13 DIAGNOSIS — F5104 Psychophysiologic insomnia: Secondary | ICD-10-CM

## 2018-09-13 DIAGNOSIS — F32A Depression, unspecified: Secondary | ICD-10-CM

## 2018-09-13 DIAGNOSIS — R0683 Snoring: Secondary | ICD-10-CM

## 2018-09-22 DIAGNOSIS — D485 Neoplasm of uncertain behavior of skin: Secondary | ICD-10-CM | POA: Diagnosis not present

## 2018-09-22 DIAGNOSIS — D2262 Melanocytic nevi of left upper limb, including shoulder: Secondary | ICD-10-CM | POA: Diagnosis not present

## 2018-09-22 DIAGNOSIS — D692 Other nonthrombocytopenic purpura: Secondary | ICD-10-CM | POA: Diagnosis not present

## 2018-09-22 DIAGNOSIS — L430 Hypertrophic lichen planus: Secondary | ICD-10-CM | POA: Diagnosis not present

## 2018-09-22 DIAGNOSIS — L509 Urticaria, unspecified: Secondary | ICD-10-CM | POA: Diagnosis not present

## 2018-09-27 ENCOUNTER — Telehealth: Payer: Self-pay | Admitting: Neurology

## 2018-09-27 DIAGNOSIS — G4719 Other hypersomnia: Secondary | ICD-10-CM | POA: Insufficient documentation

## 2018-09-27 DIAGNOSIS — F17200 Nicotine dependence, unspecified, uncomplicated: Secondary | ICD-10-CM | POA: Insufficient documentation

## 2018-09-27 DIAGNOSIS — R0683 Snoring: Secondary | ICD-10-CM | POA: Insufficient documentation

## 2018-09-27 NOTE — Addendum Note (Signed)
Addended by: Larey Seat on: 09/27/2018 09:00 AM   Modules accepted: Orders

## 2018-09-27 NOTE — Telephone Encounter (Signed)
-----   Message from Larey Seat, MD sent at 09/27/2018  9:00 AM EDT ----- Cc Dr Toy Care, MD  Mild Sleep apnea was found with an AHI of 8.5/h. The exacerbated REM AHI of 20/h is consistent with obstructive sleep apnea. There was no indication of prolonged hypoxemia.    Recommendations:     In spite of the mild degree of sleep apnea, REM dependent sleep apnea is usually treated with CPAP.  A dental device can reduce NREM sleep apnea and snoring, however. I recommend CPAP therapy.  I will order auto CPAP unless patient objects to CPAP therapy.

## 2018-09-27 NOTE — Telephone Encounter (Signed)
I called pt. I advised pt that Dr. Brett Fairy reviewed their sleep study results and found that pt has sleep apnea. Dr. Brett Fairy recommends that pt start auto CPAP. I reviewed PAP compliance expectations with the pt. Pt is agreeable to starting a CPAP. I advised pt that an order will be sent to a DME, [facility], and [facility] will call the pt within about one week after they file with the pt's insurance. [facility] will show the pt how to use the machine, fit for masks, and troubleshoot the CPAP if needed. A follow up appt was made for insurance purposes with Ward Givens, NP on Oct 22,2020 at 11 am . Pt verbalized understanding to arrive 15 minutes early and bring their CPAP. A letter with all of this information in it will be mailed to the pt as a reminder. I verified with the pt that the address we have on file is correct. Pt verbalized understanding of results. Pt had no questions at this time but was encouraged to call back if questions arise. I have sent the order to [facility] and have received confirmation that they have received the order.

## 2018-09-27 NOTE — Procedures (Signed)
  Patient Information     First Name: Gabriella Farrell Last Name: Sajdak ID: [id number]  Birth Date: September 30, 1955 Age: 63 Gender: Female  Referring Provider: Dr Toy Care, MD BMI: 19.8 (W=123 lb, H=5' 6'')  Neck Circ.:  14 '' Epworth:  16/24   Sleep Study Information    Study Date: Sep 13, 2018 S/H/A Version: [id number] / 4.1.1528 / 53  History:    Mrs. Bielefeldt is a 63 year old, right-handed Caucasian female with a possible sleep disorder. She has a past medical history of Depression, osteoporosis, and suffered a dislocated shoulder in a fall over 3 years ago. She has ongoing nerve pain in the affected left upper extremity. The patient wakes up spontaneously/ 2-3- 4 AM, every 45-60 minutes. 10 AM is the usual rise time. She reports not feeling refreshed or restored in AM, with symptoms such as dry mouth, morning headaches and the feeling of not having slept and a high degree of residual fatigue.  Naps are taken frequently, lasting from 30 to 60 minutes and are more refreshing than nocturnal sleep.       Summary & Diagnosis:    Total Sleep time was 8.5 hours. Mild Sleep apnea was found with an AHI of 8.5/h. The exacerbated REM AHI of 20/h is consistent with obstructive sleep apnea. There was no indication of prolonged hypoxemia.    Recommendations:      In spite of the mild degree of sleep apnea, REM dependent sleep apnea is usually treated with CPAP.  A dental device can reduce NREM sleep apnea and snoring, however. I recommend CPAP therapy.    Larey Seat, MD   09-27-2018             Start Study Time: End Study Time: Total Recording Time:  9:33:29 PM 7:37:30 AM    10 h, 4 min  Total Sleep Time % REM of Sleep Time:  8 h, 29 min 11.0    Mean: 92 Minimum: 88 Maximum: 98  Mean of Desaturations Nadirs (%):   92  Oxygen Desaturation. %: 4-9 10-20 >20 Total  Events Number Total  23 100.0  0 0.0  0 0.0  23 100.0  Oxygen Saturation: <90 <=88 <85 <80 <70  Duration (minutes):  Sleep % 0.8 0.0 0.1 0.0 0.0 0.0 0.0 0.0 0.0 0.0     Respiratory Indices       Total Events REM NREM All Night  pRDI: pAHI: ODI:  80  72  23 22.5 20.4 8.6 7.8 7.0 2.0 9.5 8.5 2.7       Pulse Rate Statistics during Sleep (BPM)      Mean:  73 Minimum: 63 Maximum: 93    Oxygen Saturation Statistics   Sleep Summary   Indices are calculated using technically valid sleep time of  8 hrs, 28 min. pRDI/pAHI are calculated using oxi desaturations ? 3.              Sleep Stages Chart

## 2018-10-14 DIAGNOSIS — G4733 Obstructive sleep apnea (adult) (pediatric): Secondary | ICD-10-CM | POA: Diagnosis not present

## 2018-11-13 DIAGNOSIS — G4733 Obstructive sleep apnea (adult) (pediatric): Secondary | ICD-10-CM | POA: Diagnosis not present

## 2018-11-17 ENCOUNTER — Ambulatory Visit: Payer: Medicare HMO | Admitting: Neurology

## 2018-11-18 ENCOUNTER — Encounter: Payer: Self-pay | Admitting: Adult Health

## 2018-11-18 ENCOUNTER — Other Ambulatory Visit: Payer: Self-pay

## 2018-11-18 ENCOUNTER — Ambulatory Visit (INDEPENDENT_AMBULATORY_CARE_PROVIDER_SITE_OTHER): Payer: Medicare HMO | Admitting: Adult Health

## 2018-11-18 VITALS — BP 130/72 | HR 71 | Temp 98.4°F | Wt 128.0 lb

## 2018-11-18 DIAGNOSIS — G4733 Obstructive sleep apnea (adult) (pediatric): Secondary | ICD-10-CM | POA: Diagnosis not present

## 2018-11-18 DIAGNOSIS — Z9989 Dependence on other enabling machines and devices: Secondary | ICD-10-CM

## 2018-11-18 NOTE — Progress Notes (Signed)
PATIENT: Gabriella Farrell DOB: 1955-11-29  REASON FOR VISIT: follow up HISTORY FROM: patient  HISTORY OF PRESENT ILLNESS: Today 11/18/18:  Gabriella Farrell is a 63 year old female with a history of obstructive sleep apnea on CPAP.  She returns today for follow-up.  Her sleep study indicated mild sleep apnea.  She reports that when she was started on the CPAP there was about 7-10 days of undocumented data-she reports that [facility] was made aware of this..  She is only been using the machine since September 26.  Her current download indicates that she used her machine 16 at 18 days for compliance of 27%.  She used her machine greater than 4 hours 15 days for compliance of 25%.  On average she uses her machine 7 hours and 6 minutes.  Her residual AHI is 2.6 on 6 to 12 cm of water with EPR of 2.  She does not have a significant leak.  Overall she feels that she is doing well with the CPAP.  She has not noticed a big change in her sleep for the way she feels the next day however she has only been using the machine approximately 20 days.  HISTORY (Copied from Dr.Dohmeier's note) Gabriella Farrell a 63 y.o. year old White or Caucasian female patientseen here as a referralon 08/16/2018 from Dr. Toy Care.   I have the pleasure of seeing Gabriella Farrell today,a right-handed White or Caucasian female with a possible sleep disorder. She has a  has a past medical history of Depression.. She has osteoporosis.  Suffered a dislocated shoulder in a fall over 3 years ago. She has ongoing nerve pain in the affected left upper extremity.    Familymedical /sleep history: daughter has been evaluated for EDS- takes Nuvigil.  Several other family member  with insomnia,  Her daughter Joellen Jersey is also a sleep walker.  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  The patient currently works part time  from home; Tobacco use: 3/4 pack per day smoker, over 40 years.   ETOH use  In form of G and T and wine 3-4 a week. ,  Caffeine intake in form of Coffee( 1 a day) Soda( none) Tea ( none) or energy drinks. No regular exercise since corona lock down, but gardening and walking.     Sleep habits are as follows:The patient's dinner time is between 7 - 8PM. The patient goes to bed at 9-11  PM and  Has trouble to quit thinking, is always worried. She relies on xanax  To fall asleep- continues to sleep for 1-2 hours. She is up a lot until 6 AM- when she goes into a very heavy and deep sleep. That's when she dreams, vivid, with a sense of doom.  She  wakes for 1-2 bathroom breaks.   The preferred sleep position is supine , with the support of one  pillow. Dreams are reportedly frequent/vivid.  The patient wakes up spontaneously/ 2-3- 4 AM , in 45 minute intervals. 10  AM is the usual rise time. She reports not feeling refreshed or restored in AM, with symptoms such as dry mouth, morning headaches and the feeling of not having slept and a high degree of residual fatigue. Naps are taken frequently, lasting from 30 to 60 minutes and are more refreshing than nocturnal sleep.   REVIEW OF SYSTEMS: Out of a complete 14 system  review of symptoms, the patient complains only of the following symptoms, and all other reviewed systems are negative.  Epworth sleepiness score 14 fatigue severity score 53  ALLERGIES: No Known Allergies  HOME MEDICATIONS: Outpatient Medications Prior to Visit  Medication Sig Dispense Refill  . ALPRAZolam (XANAX) 0.5 MG tablet Take 0.5 mg by mouth every 6 (six) hours as needed for anxiety.    Marland Kitchen amLODipine (NORVASC) 5 MG tablet Take 1 tablet by mouth once daily 90 tablet 3  . Armodafinil (NUVIGIL) 250 MG tablet Take 250 mg by mouth daily.    Marland Kitchen atorvastatin (LIPITOR) 20 MG tablet Take 1 tablet (20 mg total) by mouth daily. 90 tablet 3  . Calcium-Magnesium-Vitamin D (CALCIUM  1200+D3 PO) Take 1 capsule by mouth daily.    . DULoxetine (CYMBALTA) 60 MG capsule     . FLUoxetine (PROZAC) 20 MG tablet Take 20 mg by mouth daily.     . Multiple Vitamin (MULTIVITAMIN) capsule Take 1 capsule by mouth daily.    . pantoprazole (PROTONIX) 40 MG tablet Take 40 mg by mouth daily.    Marland Kitchen FLUoxetine (PROZAC) 20 MG capsule      No facility-administered medications prior to visit.     PAST MEDICAL HISTORY: Past Medical History:  Diagnosis Date  . Depression     PAST SURGICAL HISTORY: Past Surgical History:  Procedure Laterality Date  . CHOLECYSTECTOMY    . PANCREAS SURGERY    . SHOULDER SURGERY      FAMILY HISTORY: No family history on file.  SOCIAL HISTORY: Social History   Socioeconomic History  . Marital status: Married    Spouse name: Not on file  . Number of children: Not on file  . Years of education: Not on file  . Highest education level: Not on file  Occupational History  . Not on file  Social Needs  . Financial resource strain: Not on file  . Food insecurity    Worry: Not on file    Inability: Not on file  . Transportation needs    Medical: Not on file    Non-medical: Not on file  Tobacco Use  . Smoking status: Current Every Day Smoker    Packs/day: 1.00    Types: Cigarettes  . Smokeless tobacco: Never Used  Substance and Sexual Activity  . Alcohol use: Yes    Alcohol/week: 15.0 standard drinks    Types: 14 Standard drinks or equivalent, 1 Glasses of wine per week    Comment: daily  . Drug use: No  . Sexual activity: Not on file  Lifestyle  . Physical activity    Days per week: Not on file    Minutes per session: Not on file  . Stress: Not on file  Relationships  . Social Herbalist on phone: Not on file    Gets together: Not on file    Attends religious service: Not on file    Active member of club or organization: Not on file    Attends meetings of clubs or organizations: Not on file    Relationship status: Not on  file  . Intimate partner violence    Fear of current or ex partner: Not on file    Emotionally abused: Not on file    Physically abused: Not on file    Forced sexual activity: Not on file  Other Topics Concern  . Not on file  Social History Narrative  . Not on file  PHYSICAL EXAM  Vitals:   11/18/18 1424  BP: 130/72  Pulse: 71  Temp: 98.4 F (36.9 C)  Weight: 128 lb (58.1 kg)   Body mass index is 20.66 kg/m.  Generalized: Well developed, in no acute distress  Chest: Lungs clear to auscultation bilaterally  Neurological examination  Mentation: Alert oriented to time, place, history taking. Follows all commands speech and language fluent Cranial nerve II-XII: Extraocular movements were full, visual field were full on confrontational test Head turning and shoulder shrug  were normal and symmetric. Motor: The motor testing reveals 5 over 5 strength of all 4 extremities. Good symmetric motor tone is noted throughout.  Sensory: Sensory testing is intact to soft touch on all 4 extremities. No evidence of extinction is noted.  Gait and station: Gait is normal.   DIAGNOSTIC DATA (LABS, IMAGING, TESTING) - I reviewed patient records, labs, notes, testing and imaging myself where available.  Lab Results  Component Value Date   WBC 9.5 10/13/2009   HGB 12.2 10/13/2009   HCT 34.3 (L) 10/13/2009   MCV 93.2 10/13/2009   PLT 293 10/13/2009      Component Value Date/Time   NA 134 06/10/2018 1119   K 4.3 06/10/2018 1119   CL 97 06/10/2018 1119   CO2 22 06/10/2018 1119   GLUCOSE 103 (H) 06/10/2018 1119   GLUCOSE 87 10/12/2009 0533   BUN 9 06/10/2018 1119   CREATININE 0.78 06/10/2018 1119   CALCIUM 10.2 06/10/2018 1119   PROT 7.3 06/10/2018 1119   ALBUMIN 4.6 06/10/2018 1119   AST 37 06/10/2018 1119   ALT 26 06/10/2018 1119   ALKPHOS 66 06/10/2018 1119   BILITOT 0.6 06/10/2018 1119   GFRNONAA 82 06/10/2018 1119   GFRAA 94 06/10/2018 1119   Lab Results  Component  Value Date   CHOL 164 06/10/2018   HDL 80 06/10/2018   LDLCALC 65 06/10/2018   TRIG 94 06/10/2018   CHOLHDL 3.8 10/12/2009    Lab Results  Component Value Date   TSH 1.845 10/11/2009      ASSESSMENT AND PLAN 63 y.o. year old female  has a past medical history of Depression. here with:  1. Obstructive sleep apnea on CPAP  Patient CPAP download shows excellent compliance and good treatment of her apnea.  She is encouraged to continue using CPAP nightly and greater than 4 hours each night.  We currently do not have a full 30 days worth of data.  The patient was advised to call back in 2 to 3 weeks and I would addend her note with a download that reflects 30 days.  She is advised that if her symptoms worsen or she develops new symptoms she should let us know.  She will follow-up in 6 months or sooner if needed    I spent 15 minutes with the patient. 50% of this time was spent reviewing CPAP download  Ward Givens, MSN, NP-C 11/18/2018, 2:40 PM [facility] [address], [place], [place] [address] [phone]

## 2018-11-18 NOTE — Patient Instructions (Signed)
Continue using CPAP nightly and greater than 4 hours each night Call in 2 weeks so I can print another download.  If your symptoms worsen or you develop new symptoms please let us know.

## 2018-11-25 ENCOUNTER — Ambulatory Visit: Payer: Self-pay | Admitting: Adult Health

## 2018-12-02 DIAGNOSIS — G4733 Obstructive sleep apnea (adult) (pediatric): Secondary | ICD-10-CM | POA: Diagnosis not present

## 2018-12-08 ENCOUNTER — Telehealth: Payer: Self-pay | Admitting: Adult Health

## 2018-12-08 DIAGNOSIS — M545 Low back pain: Secondary | ICD-10-CM | POA: Diagnosis not present

## 2018-12-08 DIAGNOSIS — M5417 Radiculopathy, lumbosacral region: Secondary | ICD-10-CM | POA: Diagnosis not present

## 2018-12-08 NOTE — Telephone Encounter (Signed)
Downloaded and in box.

## 2018-12-08 NOTE — Telephone Encounter (Signed)
Pt called stating that she was to call back to see if RN had enough data from her cpap since they could not get enough on her last appt. Please advise.

## 2018-12-09 NOTE — Telephone Encounter (Signed)
I relayed that cpap download was good.  Pt verbalized understanding.

## 2018-12-09 NOTE — Telephone Encounter (Signed)
Download shows that she used her machine 29 out of 30 days for compliance of 97%.  She used her machine greater than 4 hours 25 days for compliance of 83%.  On average she uses her machine 7 hours and 11 minutes.  Her residual AHI is 2 on 6-12 cmH2O.  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

## 2018-12-14 DIAGNOSIS — G4733 Obstructive sleep apnea (adult) (pediatric): Secondary | ICD-10-CM | POA: Diagnosis not present

## 2018-12-17 ENCOUNTER — Other Ambulatory Visit: Payer: Self-pay

## 2018-12-17 DIAGNOSIS — Z20822 Contact with and (suspected) exposure to covid-19: Secondary | ICD-10-CM

## 2018-12-19 LAB — NOVEL CORONAVIRUS, NAA: SARS-CoV-2, NAA: NOT DETECTED

## 2019-01-04 ENCOUNTER — Other Ambulatory Visit: Payer: Self-pay | Admitting: Cardiology

## 2019-01-04 DIAGNOSIS — Z1231 Encounter for screening mammogram for malignant neoplasm of breast: Secondary | ICD-10-CM

## 2019-01-13 DIAGNOSIS — G4733 Obstructive sleep apnea (adult) (pediatric): Secondary | ICD-10-CM | POA: Diagnosis not present

## 2019-01-15 ENCOUNTER — Other Ambulatory Visit: Payer: Self-pay

## 2019-01-15 ENCOUNTER — Ambulatory Visit
Admission: RE | Admit: 2019-01-15 | Discharge: 2019-01-15 | Disposition: A | Discharge status: Home / Self Care | Payer: Medicare HMO | Source: Ambulatory Visit | Attending: Cardiology | Admitting: Cardiology

## 2019-01-15 DIAGNOSIS — Z1231 Encounter for screening mammogram for malignant neoplasm of breast: Secondary | ICD-10-CM

## 2019-01-18 ENCOUNTER — Telehealth: Payer: Self-pay

## 2019-01-19 DIAGNOSIS — G4733 Obstructive sleep apnea (adult) (pediatric): Secondary | ICD-10-CM | POA: Diagnosis not present

## 2019-02-07 ENCOUNTER — Ambulatory Visit: Payer: Self-pay | Admitting: Cardiology

## 2019-02-07 ENCOUNTER — Other Ambulatory Visit: Payer: Self-pay | Admitting: Cardiology

## 2019-02-07 DIAGNOSIS — I10 Essential (primary) hypertension: Secondary | ICD-10-CM

## 2019-02-07 DIAGNOSIS — I6521 Occlusion and stenosis of right carotid artery: Secondary | ICD-10-CM

## 2019-02-07 NOTE — Progress Notes (Signed)
Carotid duplex, lipids, and CMP order placed

## 2019-02-07 NOTE — Progress Notes (Deleted)
Primary Physician:  Hulan Fess, MD   Patient ID: Gabriella Farrell, female    DOB: [birth year], 64 y.o.   MRN: [id number]  Subjective:    No chief complaint on file.   HPI: Gabriella Farrell  is a 64 y.o. female  with vitamin D deficiency, hypercholesterolemia, alcohol abuse, fatty liver disease diagnosed in September 2008, chronic pancreatitis, known thyroid nodule being followed by annual thyroid ultrasound, adenoma of anal canal, recently evaluated by [place] for chest pain and hypertension. Had low risk exercise nuclear stress test in Oct 2019 and essentially normal echo at that time. Has mild carotied disease by carotid duplex in Oct 2019.  She was started on Lipitor at her last office visit that she is tolerating well. She is now here on 3 month office visit to discuss recent lab results.  She reports improvement in chest pain symptoms; however, do continue to occur on occasion. She notices pinpoint, left-sided chest discomfort around her epigastric region that last for 1-2 minutes. No associated with exertion. She is followed by GI for esophageal stricuture. She has pain constantly in her left arm that has been present since surgery on her left shoulder several years ago. Has dyspnea on exertion that is unchanged.  She does continue to drink 2 gin and tonics a day, but states that this is less than previous. She has cut back on tobacco use to only a few cigarettes per day from 1 pack per day. Husband is present at bedside.  Past Medical History:  Diagnosis Date  . Depression     Past Surgical History:  Procedure Laterality Date  . BREAST EXCISIONAL BIOPSY Right   . CHOLECYSTECTOMY    . PANCREAS SURGERY    . SHOULDER SURGERY      Social History   Socioeconomic History  . Marital status: Married    Spouse name: Not on file  . Number of children: Not on file  . Years of education: Not on file  . Highest education level: Not on file  Occupational History  . Not on file    Tobacco Use  . Smoking status: Current Every Day Smoker    Packs/day: 1.00    Types: Cigarettes  . Smokeless tobacco: Never Used  Substance and Sexual Activity  . Alcohol use: Yes    Alcohol/week: 15.0 standard drinks    Types: 14 Standard drinks or equivalent, 1 Glasses of wine per week    Comment: daily  . Drug use: No  . Sexual activity: Not on file  Other Topics Concern  . Not on file  Social History Narrative  . Not on file   Social Determinants of Health   Financial Resource Strain:   . Difficulty of Paying Living Expenses: Not on file  Food Insecurity:   . Worried About Charity fundraiser in the Last Year: Not on file  . Ran Out of Food in the Last Year: Not on file  Transportation Needs:   . Lack of Transportation (Medical): Not on file  . Lack of Transportation (Non-Medical): Not on file  Physical Activity:   . Days of Exercise per Week: Not on file  . Minutes of Exercise per Session: Not on file  Stress:   . Feeling of Stress : Not on file  Social Connections:   . Frequency of Communication with Friends and Family: Not on file  . Frequency of Social Gatherings with Friends and Family: Not on file  . Attends Religious Services: Not on file  .  Active Member of Clubs or Organizations: Not on file  . Attends Archivist Meetings: Not on file  . Marital Status: Not on file  Intimate Partner Violence:   . Fear of Current or Ex-Partner: Not on file  . Emotionally Abused: Not on file  . Physically Abused: Not on file  . Sexually Abused: Not on file    Review of Systems  Constitution: Positive for malaise/fatigue. Negative for decreased appetite, weight gain and weight loss.  Eyes: Negative for visual disturbance.  Cardiovascular: Positive for dyspnea on exertion. Negative for chest pain, claudication, leg swelling, orthopnea, palpitations and syncope.  Respiratory: Negative for hemoptysis and wheezing.   Endocrine: Negative for cold intolerance and  heat intolerance.  Hematologic/Lymphatic: Does not bruise/bleed easily.  Skin: Negative for nail changes.  Musculoskeletal: Negative for muscle weakness and myalgias.  Gastrointestinal: Negative for abdominal pain, change in bowel habit, nausea and vomiting.  Neurological: Negative for difficulty with concentration, dizziness, focal weakness and headaches.  Psychiatric/Behavioral: Negative for altered mental status and suicidal ideas.  All other systems reviewed and are negative.     Objective:  There were no vitals taken for this visit. There is no height or weight on file to calculate BMI.    Physical Exam  Constitutional: She is oriented to person, place, and time. Vital signs are normal. She appears well-developed and well-nourished.  HENT:  Head: Normocephalic and atraumatic.  Cardiovascular: Normal rate, regular rhythm, normal heart sounds and intact distal pulses.  Pulses:      Carotid pulses are on the right side with bruit. Pulmonary/Chest: Effort normal and breath sounds normal. No accessory muscle usage. No respiratory distress.  Abdominal: Soft. Bowel sounds are normal.  Musculoskeletal:        General: Normal range of motion.     Cervical back: Normal range of motion.  Neurological: She is alert and oriented to person, place, and time.  Skin: Skin is warm and dry.  Vitals reviewed.  Radiology: No results found.  Laboratory examination:   02/05/2018: Cholesterol 218, triglycerides 98, HDL 80, LDL 118. Glucose 105, creatinine 0.9, EGFR 69/80, potassium 5.9, albumin 5.0, CMP otherwise normal.  CMP Latest Ref Rng & Units 06/10/2018 10/12/2009 10/11/2009  Glucose 65 - 99 mg/dL 103(H) 87 103(H)  BUN 8 - 27 mg/dL 9 2(L) 4(L)  Creatinine 0.57 - 1.00 mg/dL 0.78 0.55 0.53  Sodium 134 - 144 mmol/L 134 135 139  Potassium 3.5 - 5.2 mmol/L 4.3 4.2 3.9  Chloride 96 - 106 mmol/L 97 106 113(H)  CO2 20 - 29 mmol/L _0 Calcium 8.7 - 10.3 mg/dL 10.2 9.1 8.4  Total Protein 6.0 -  8.5 g/dL 7.3 6.9 -  Total Bilirubin 0.0 - 1.2 mg/dL 0.6 0.7 -  Alkaline Phos 39 - 117 IU/L 66 90 -  AST 0 - 40 IU/L 37 18 -  ALT 0 - 32 IU/L 26 15 -   CBC Latest Ref Rng & Units 10/13/2009 10/12/2009 10/11/2009  WBC 4.0 - 10.5 K/uL 9.5 10.9(H) 8.2  Hemoglobin 12.0 - 15.0 g/dL 12.2 12.0 11.5(L)  Hematocrit 36.0 - 46.0 % 34.3(L) 34.9(L) 33.4(L)  Platelets 150 - 400 K/uL 293 309 279   Lipid Panel     Component Value Date/Time   CHOL 164 06/10/2018 1119   TRIG 94 06/10/2018 1119   HDL 80 06/10/2018 1119   CHOLHDL 3.8 10/12/2009 0533   VLDL 22 10/12/2009 0533   LDLCALC 65 06/10/2018 1119   HEMOGLOBIN A1C  No results found for: HGBA1C, MPG TSH No results for input(s): TSH in the last 8760 hours.  PRN Meds:. There are no discontinued medications. No outpatient medications have been marked as taking for the 02/07/19 encounter (Appointment) with Miquel Dunn, NP.    Cardiac Studies:   Carotid artery duplex December 06, 2017: Minimal stenosis in the bilateral internal carotid artery (minimal). Mild stenosis in the left external carotid artery (<50%). Antegrade right vertebral artery flow. Left vertebral artery flow is not visualized. Follow up in one year is appropriate if clinically indicated.  Exercise myoview stress 11/09/2017: 1. Lexiscan stress test was performed. Exercise capacity was not assessed. Stress symptoms included dizziness and chest pressure. Peak effect blood pressure was 130/70 mmHg. The resting and stress electrocardiogram demonstrated normal sinus rhythm, normal resting conduction, no resting arrhythmias and normal rest repolarization. Stress EKG is non diagnostic for ischemia as it is a pharmacologic stress. 2. The overall quality of the study is excellent. There is no evidence of abnormal lung activity. Stress and rest SPECT images demonstrate homogeneous tracer distribution throughout the myocardium. Gated SPECT imaging reveals normal myocardial thickening and wall  motion. The left ventricular ejection fraction was normal (69%). 3. Low risk study.  Echo- 12-06-17 1. Left ventricle cavity is normal in size. Normal global wall motion. Visual EF is 55-60%. Normal diastolic filling pattern. 2. Trace mitral regurgitation. 3. Mild tricuspid regurgitation. No evidence of pulmonary hypertension.  Assessment:   No diagnosis found.  EKG 11/04/2017: Normal sinus rhythm at rate of 81 bpm, normal axis. Incomplete right bundle branch block. Nonspecific T abnormality in the anteroseptal leads, cannot exclude ischemia.  Recommendations:   Patient with history of alcoholic fatty liver disease, alcohol abuse, tobacco abuse, anxiety, chronic pancreatitis, evaluated by use for chest pain. She underwent exercise nuclear stress test in Oct 2019 that was low risk study, echocardiogram was essentially normal, and also carotid duplex that showed mild carotid disease.  Patient is here on a 3 month office visit and follow-up for hyperlipidemia. She's had improvement in her lipids with Lipitor and tolerating this well, I recommended further increasing Lipitor to 20 mg to reduce her LDL to at least less than 100. She has minimal stenosis and bilateral carotid arteries by carotid duplex in October 2019. We'll recommend repeating carotid duplex in 1 year, and if remained stable follow-up on a when necessary basis.  She continues to have occasional episodes of atypical chest pain related to GERD. She is followed by GI for esophageal stricture.  She is noted to have hyperglycemia on recent CMP, will obtain HgbA1c for further evaluation. She also has hyperkalemia. Given these two findings, could consider evaluation of renal tubular acidosis. I will forward results to PCP for evaluation. I have encouarged her to avoid high potassium foods.  Hypertension has improved, continue with amlodipine. Unfortunatley, continues to smoke. She will continue to work to quit smoking. I will see  her back in 1 year after carotid duplex or sooner if problems.  Miquel Dunn, MSN, APRN, FNP-C [facility]. [place] Office: [phone] Fax: [phone]

## 2019-02-13 DIAGNOSIS — G4733 Obstructive sleep apnea (adult) (pediatric): Secondary | ICD-10-CM | POA: Diagnosis not present

## 2019-02-14 ENCOUNTER — Ambulatory Visit (INDEPENDENT_AMBULATORY_CARE_PROVIDER_SITE_OTHER): Payer: Medicare HMO

## 2019-02-14 ENCOUNTER — Other Ambulatory Visit: Payer: Self-pay

## 2019-02-14 DIAGNOSIS — I6521 Occlusion and stenosis of right carotid artery: Secondary | ICD-10-CM

## 2019-02-14 DIAGNOSIS — I6523 Occlusion and stenosis of bilateral carotid arteries: Secondary | ICD-10-CM

## 2019-02-16 DIAGNOSIS — I6521 Occlusion and stenosis of right carotid artery: Secondary | ICD-10-CM | POA: Diagnosis not present

## 2019-02-17 LAB — COMPREHENSIVE METABOLIC PANEL
ALT: 27 IU/L (ref 0–32)
AST: 36 IU/L (ref 0–40)
Albumin/Globulin Ratio: 2 (ref 1.2–2.2)
Albumin: 4.9 g/dL — ABNORMAL HIGH (ref 3.8–4.8)
Alkaline Phosphatase: 68 IU/L (ref 39–117)
BUN/Creatinine Ratio: 10 — ABNORMAL LOW (ref 12–28)
BUN: 11 mg/dL (ref 8–27)
Bilirubin Total: 0.6 mg/dL (ref 0.0–1.2)
CO2: 22 mmol/L (ref 20–29)
Calcium: 9.6 mg/dL (ref 8.7–10.3)
Chloride: 98 mmol/L (ref 96–106)
Creatinine, Ser: 1.06 mg/dL — ABNORMAL HIGH (ref 0.57–1.00)
GFR calc Af Amer: 65 mL/min/{1.73_m2} (ref >59)
GFR calc non Af Amer: 56 mL/min/{1.73_m2} — ABNORMAL LOW (ref >59)
Globulin, Total: 2.4 g/dL (ref 1.5–4.5)
Glucose: 104 mg/dL — ABNORMAL HIGH (ref 65–99)
Potassium: 4.8 mmol/L (ref 3.5–5.2)
Sodium: 137 mmol/L (ref 134–144)
Total Protein: 7.3 g/dL (ref 6.0–8.5)

## 2019-02-17 LAB — LIPID PANEL
Chol/HDL Ratio: 1.8 ratio (ref 0.0–4.4)
Cholesterol, Total: 166 mg/dL (ref 100–199)
HDL: 91 mg/dL (ref >39)
LDL Chol Calc (NIH): 62 mg/dL (ref 0–99)
Triglycerides: 65 mg/dL (ref 0–149)
VLDL Cholesterol Cal: 13 mg/dL (ref 5–40)

## 2019-02-28 ENCOUNTER — Encounter: Payer: Self-pay | Admitting: Cardiology

## 2019-02-28 ENCOUNTER — Ambulatory Visit: Payer: Medicare HMO | Admitting: Cardiology

## 2019-02-28 ENCOUNTER — Other Ambulatory Visit: Payer: Self-pay

## 2019-02-28 VITALS — BP 129/74 | HR 80 | Ht 66.0 in | Wt 132.4 lb

## 2019-02-28 DIAGNOSIS — I10 Essential (primary) hypertension: Secondary | ICD-10-CM

## 2019-02-28 DIAGNOSIS — R202 Paresthesia of skin: Secondary | ICD-10-CM

## 2019-02-28 DIAGNOSIS — F17210 Nicotine dependence, cigarettes, uncomplicated: Secondary | ICD-10-CM

## 2019-02-28 DIAGNOSIS — E782 Mixed hyperlipidemia: Secondary | ICD-10-CM

## 2019-02-28 DIAGNOSIS — F17200 Nicotine dependence, unspecified, uncomplicated: Secondary | ICD-10-CM

## 2019-02-28 DIAGNOSIS — R69 Illness, unspecified: Secondary | ICD-10-CM | POA: Diagnosis not present

## 2019-02-28 DIAGNOSIS — R200 Anesthesia of skin: Secondary | ICD-10-CM

## 2019-02-28 NOTE — Progress Notes (Signed)
Primary Physician:  Hulan Fess, MD   Patient ID: Gabriella Farrell, female    DOB: Feb 15, 1955, 64 y.o.   MRN: [id number]  Subjective:    Chief Complaint  Patient presents with  . Hypertension    HPI: Gabriella Farrell  is a 64 y.o. female  with vitamin D deficiency, hypercholesterolemia, alcohol abuse, fatty liver disease diagnosed in September 2008, chronic pancreatitis, known thyroid nodule being followed by annual thyroid ultrasound, adenoma of anal canal, recently evaluated by [place] for chest pain and hypertension. Had low risk exercise nuclear stress test in Oct 2019 and essentially normal echo at that time. Has mild carotid disease by carotid duplex in Oct 2019.  She is now here on 1 year office visit to discuss lab results and carotid duplex. States that she feels well, no significant complaints. She is to see Ortho next week. She has previously fractured and dislocated her left shoulder, and now has tingling/numbness and pain in her left arm. Has been present since surgery a few years ago. She does have arm fatigue with doing things.   No exertional chest pain. Blood pressure has been well controlled. She is due to see her PCP. She does, unfortunately, continue to smoke approximately less than 1 pack per day.  Past Medical History:  Diagnosis Date  . Depression     Past Surgical History:  Procedure Laterality Date  . BREAST EXCISIONAL BIOPSY Right   . CHOLECYSTECTOMY    . PANCREAS SURGERY    . SHOULDER SURGERY      Social History   Socioeconomic History  . Marital status: Married    Spouse name: Not on file  . Number of children: 2  . Years of education: Not on file  . Highest education level: Not on file  Occupational History  . Not on file  Tobacco Use  . Smoking status: Current Every Day Smoker    Packs/day: 1.00    Types: Cigarettes  . Smokeless tobacco: Never Used  Substance and Sexual Activity  . Alcohol use: Yes    Alcohol/week: 15.0 standard drinks   Types: 1 Glasses of wine, 14 Standard drinks or equivalent per week    Comment: daily  . Drug use: No  . Sexual activity: Not on file  Other Topics Concern  . Not on file  Social History Narrative  . Not on file   Social Determinants of Health   Financial Resource Strain:   . Difficulty of Paying Living Expenses: Not on file  Food Insecurity:   . Worried About Charity fundraiser in the Last Year: Not on file  . Ran Out of Food in the Last Year: Not on file  Transportation Needs:   . Lack of Transportation (Medical): Not on file  . Lack of Transportation (Non-Medical): Not on file  Physical Activity:   . Days of Exercise per Week: Not on file  . Minutes of Exercise per Session: Not on file  Stress:   . Feeling of Stress : Not on file  Social Connections:   . Frequency of Communication with Friends and Family: Not on file  . Frequency of Social Gatherings with Friends and Family: Not on file  . Attends Religious Services: Not on file  . Active Member of Clubs or Organizations: Not on file  . Attends Archivist Meetings: Not on file  . Marital Status: Not on file  Intimate Partner Violence:   . Fear of Current or Ex-Partner: Not on file  .  Emotionally Abused: Not on file  . Physically Abused: Not on file  . Sexually Abused: Not on file    Review of Systems  Constitution: Negative for decreased appetite, malaise/fatigue, weight gain and weight loss.  Eyes: Negative for visual disturbance.  Cardiovascular: Negative for chest pain, claudication, dyspnea on exertion, leg swelling, orthopnea, palpitations and syncope.  Respiratory: Negative for hemoptysis and wheezing.   Endocrine: Negative for cold intolerance and heat intolerance.  Hematologic/Lymphatic: Does not bruise/bleed easily.  Skin: Negative for nail changes.  Musculoskeletal: Negative for muscle weakness and myalgias.  Gastrointestinal: Negative for abdominal pain, change in bowel habit, nausea and  vomiting.  Neurological: Positive for numbness (Left arm since surgery a few years ago as well as fatigue). Negative for difficulty with concentration, dizziness, focal weakness and headaches.  Psychiatric/Behavioral: Negative for altered mental status and suicidal ideas.  All other systems reviewed and are negative.     Objective:  Blood pressure 129/74, pulse 80, height 5' 6" (1.676 m), weight 132 lb 6.4 oz (60.1 kg), SpO2 98 %. Body mass index is 21.37 kg/m.    Physical Exam  Constitutional: She is oriented to person, place, and time. Vital signs are normal. She appears well-developed and well-nourished.  HENT:  Head: Normocephalic and atraumatic.  Cardiovascular: Normal rate, regular rhythm, normal heart sounds and intact distal pulses.  Pulses:      Carotid pulses are on the right side with bruit. Pulmonary/Chest: Effort normal and breath sounds normal. No accessory muscle usage. No respiratory distress.  Abdominal: Soft. Bowel sounds are normal.  Musculoskeletal:        General: Normal range of motion.     Cervical back: Normal range of motion.  Neurological: She is alert and oriented to person, place, and time.  Skin: Skin is warm and dry.  Vitals reviewed.  Radiology: No results found.  Laboratory examination:   02/05/2018: Cholesterol 218, triglycerides 98, HDL 80, LDL 118. Glucose 105, creatinine 0.9, EGFR 69/80, potassium 5.9, albumin 5.0, CMP otherwise normal.  CMP Latest Ref Rng & Units 02/16/2019 06/10/2018 10/12/2009  Glucose 65 - 99 mg/dL 104(H) 103(H) 87  BUN 8 - 27 mg/dL 11 9 2(L)  Creatinine 0.57 - 1.00 mg/dL 1.06(H) 0.78 0.55  Sodium 134 - 144 mmol/L 137 134 135  Potassium 3.5 - 5.2 mmol/L 4.8 4.3 4.2  Chloride 96 - 106 mmol/L 98 97 106  CO2 20 - 29 mmol/L _0 Calcium 8.7 - 10.3 mg/dL 9.6 10.2 9.1  Total Protein 6.0 - 8.5 g/dL 7.3 7.3 6.9  Total Bilirubin 0.0 - 1.2 mg/dL 0.6 0.6 0.7  Alkaline Phos 39 - 117 IU/L 68 66 90  AST 0 - 40 IU/L 36 37 18  ALT 0  - 32 IU/L _1 CBC Latest Ref Rng & Units 10/13/2009 10/12/2009 10/11/2009  WBC 4.0 - 10.5 K/uL 9.5 10.9(H) 8.2  Hemoglobin 12.0 - 15.0 g/dL 12.2 12.0 11.5(L)  Hematocrit 36.0 - 46.0 % 34.3(L) 34.9(L) 33.4(L)  Platelets 150 - 400 K/uL 293 309 279   Lipid Panel     Component Value Date/Time   CHOL 166 02/16/2019 1126   TRIG 65 02/16/2019 1126   HDL 91 02/16/2019 1126   CHOLHDL 1.8 02/16/2019 1126   CHOLHDL 3.8 10/12/2009 0533   VLDL 22 10/12/2009 0533   LDLCALC 62 02/16/2019 1126   HEMOGLOBIN A1C No results found for: HGBA1C, MPG TSH No results for input(s): TSH in the last 8760 hours.  PRN  Meds:. Medications Discontinued During This Encounter  Medication Reason  . FLUoxetine (PROZAC) 20 MG tablet Error   Current Meds  Medication Sig  . ALPRAZolam (XANAX) 0.5 MG tablet Take 0.5 mg by mouth every 6 (six) hours as needed for anxiety.  Marland Kitchen amLODipine (NORVASC) 5 MG tablet Take 1 tablet by mouth once daily  . atorvastatin (LIPITOR) 20 MG tablet Take 1 tablet (20 mg total) by mouth daily.  . Calcium-Magnesium-Vitamin D (CALCIUM 1200+D3 PO) Take 1 capsule by mouth daily.  . DULoxetine (CYMBALTA) 20 MG capsule daily.   Marland Kitchen FLUoxetine (PROZAC) 20 MG capsule   . Multiple Vitamin (MULTIVITAMIN) capsule Take 1 capsule by mouth daily.  . pantoprazole (PROTONIX) 40 MG tablet Take 40 mg by mouth daily.  . [DISCONTINUED] FLUoxetine (PROZAC) 20 MG tablet Take 20 mg by mouth daily.     Cardiac Studies:   Carotid artery duplex  02/14/2019: No hemodynamically significant arterial disease in the internal carotid artery bilaterally. Mild plaque bilaterally.  Antegrade right vertebral artery flow. Antegrade left vertebral artery flow. The left vertebral artery waveform demonstrates an antegrade, high-resistant flow pattern. This may suggest distal stenosis or occlusion.  No significant change in 11/25/2017. Previously left vertebral flow not evident.   Exercise myoview stress 11/09/2017: 1.  Lexiscan stress test was performed. Exercise capacity was not assessed. Stress symptoms included dizziness and chest pressure. Peak effect blood pressure was 130/70 mmHg. The resting and stress electrocardiogram demonstrated normal sinus rhythm, normal resting conduction, no resting arrhythmias and normal rest repolarization. Stress EKG is non diagnostic for ischemia as it is a pharmacologic stress. 2. The overall quality of the study is excellent. There is no evidence of abnormal lung activity. Stress and rest SPECT images demonstrate homogeneous tracer distribution throughout the myocardium. Gated SPECT imaging reveals normal myocardial thickening and wall motion. The left ventricular ejection fraction was normal (69%). 3. Low risk study.  Echo- 11/25/2017 1. Left ventricle cavity is normal in size. Normal global wall motion. Visual EF is 55-60%. Normal diastolic filling pattern. 2. Trace mitral regurgitation. 3. Mild tricuspid regurgitation. No evidence of pulmonary hypertension.  Assessment:   Mixed hyperlipidemia  Essential hypertension - Plan: EKG 12-Lead  Tobacco use disorder  Numbness and tingling in left arm  EKG 02/28/2019: Normal sinus rhythm at 80 bpm, normal axis, IRBBB. Diffuse nonspecific T wave abnormality.   Recommendations:   Patient with history of alcoholic fatty liver disease, alcohol abuse, tobacco abuse, anxiety, chronic pancreatitis, originally evaluated by use for chest pain. She underwent exercise nuclear stress test in Oct 2019 that was low risk study, echocardiogram was essentially normal, and also carotid duplex that showed mild carotid disease.  She is here for 1 year follow up. I have discussed her recent carotid duplex that shows minimal disease. She was noted to have resistant flow of left vertebral artery by waveform, likely suggestive of small vessel disease. She is asymptomatic in regards to this and do not feel clinically significant. Would recommend  that she continue with aggressive risk factor modification. She does have arm fatigue and tingling/numbness in her left arm potentially related to nerve disorder from previous injury to her left shoulder. She has normal left radial pulse and BP are equal. Carotid ultrasound did not suggest subclavian artery stenosis, suspect that this is nerve injury related. She is to see orthapedics next week. If this evaluation is unyielding, could consider further evaluation for this.  I have discussed her recent CMP and lipids. Liver enzymes are within normal  limits. Lipids are also well controlled. Will continue with current medications. Kidney function was very mildly depressed compared to previous CMP, would recommend repeating in the next few months. Encouraged her to make follow up with PCP regarding this. Blood pressure is well controlled.   I have strongly recommended that she continue to work on smoking cessation. She has been making changes to help promote quitting, which I have urged her to continue. Advised her to try nicorette patches or gum and if she is still unable to quit, consider medications. I will plan to see her back on a PRN basis.    *I have discussed this case with Dr. Einar Gip and he participated in formulating the plan.*   CC: Marda Stalker, PA  Miquel Dunn, MSN, APRN, [facility]. [place] Office: [phone] Fax: [phone]

## 2019-03-09 DIAGNOSIS — M25512 Pain in left shoulder: Secondary | ICD-10-CM | POA: Diagnosis not present

## 2019-03-10 ENCOUNTER — Other Ambulatory Visit: Payer: Self-pay | Admitting: Orthopaedic Surgery

## 2019-03-10 DIAGNOSIS — M25512 Pain in left shoulder: Secondary | ICD-10-CM

## 2019-03-16 DIAGNOSIS — G4733 Obstructive sleep apnea (adult) (pediatric): Secondary | ICD-10-CM | POA: Diagnosis not present

## 2019-03-17 ENCOUNTER — Other Ambulatory Visit: Payer: Medicare HMO

## 2019-03-17 ENCOUNTER — Ambulatory Visit
Admission: RE | Admit: 2019-03-17 | Discharge: 2019-03-17 | Disposition: A | Discharge status: Home / Self Care | Payer: Medicare HMO | Source: Ambulatory Visit | Attending: Orthopaedic Surgery | Admitting: Orthopaedic Surgery

## 2019-03-17 DIAGNOSIS — M25512 Pain in left shoulder: Secondary | ICD-10-CM

## 2019-03-17 DIAGNOSIS — S42202D Unspecified fracture of upper end of left humerus, subsequent encounter for fracture with routine healing: Secondary | ICD-10-CM | POA: Diagnosis not present

## 2019-03-21 DIAGNOSIS — G5602 Carpal tunnel syndrome, left upper limb: Secondary | ICD-10-CM | POA: Diagnosis not present

## 2019-03-22 ENCOUNTER — Other Ambulatory Visit: Payer: Medicare HMO

## 2019-03-26 ENCOUNTER — Other Ambulatory Visit: Payer: Self-pay | Admitting: Orthopaedic Surgery

## 2019-03-26 DIAGNOSIS — R079 Chest pain, unspecified: Secondary | ICD-10-CM

## 2019-03-28 ENCOUNTER — Ambulatory Visit: Payer: Medicare HMO | Attending: Internal Medicine

## 2019-03-28 DIAGNOSIS — Z20822 Contact with and (suspected) exposure to covid-19: Secondary | ICD-10-CM | POA: Diagnosis not present

## 2019-03-29 LAB — NOVEL CORONAVIRUS, NAA: SARS-CoV-2, NAA: NOT DETECTED

## 2019-04-05 ENCOUNTER — Ambulatory Visit
Admission: RE | Admit: 2019-04-05 | Discharge: 2019-04-05 | Disposition: A | Discharge status: Home / Self Care | Payer: Medicare HMO | Source: Ambulatory Visit | Attending: Orthopaedic Surgery | Admitting: Orthopaedic Surgery

## 2019-04-05 DIAGNOSIS — R911 Solitary pulmonary nodule: Secondary | ICD-10-CM | POA: Diagnosis not present

## 2019-04-05 DIAGNOSIS — R079 Chest pain, unspecified: Secondary | ICD-10-CM

## 2019-04-06 DIAGNOSIS — G5602 Carpal tunnel syndrome, left upper limb: Secondary | ICD-10-CM | POA: Diagnosis not present

## 2019-04-06 DIAGNOSIS — M25512 Pain in left shoulder: Secondary | ICD-10-CM | POA: Diagnosis not present

## 2019-04-13 DIAGNOSIS — G4733 Obstructive sleep apnea (adult) (pediatric): Secondary | ICD-10-CM | POA: Diagnosis not present

## 2019-04-25 DIAGNOSIS — G4733 Obstructive sleep apnea (adult) (pediatric): Secondary | ICD-10-CM | POA: Diagnosis not present

## 2019-05-02 ENCOUNTER — Ambulatory Visit: Payer: Medicare HMO | Attending: Internal Medicine

## 2019-05-02 DIAGNOSIS — Z20822 Contact with and (suspected) exposure to covid-19: Secondary | ICD-10-CM

## 2019-05-03 LAB — NOVEL CORONAVIRUS, NAA: SARS-CoV-2, NAA: NOT DETECTED

## 2019-05-03 LAB — SARS-COV-2, NAA 2 DAY TAT

## 2019-05-14 DIAGNOSIS — G4733 Obstructive sleep apnea (adult) (pediatric): Secondary | ICD-10-CM | POA: Diagnosis not present

## 2019-05-18 DIAGNOSIS — M25512 Pain in left shoulder: Secondary | ICD-10-CM | POA: Diagnosis not present

## 2019-05-18 DIAGNOSIS — G5602 Carpal tunnel syndrome, left upper limb: Secondary | ICD-10-CM | POA: Diagnosis not present

## 2019-05-19 ENCOUNTER — Ambulatory Visit: Payer: Medicare HMO | Admitting: Adult Health

## 2019-06-10 DIAGNOSIS — M25512 Pain in left shoulder: Secondary | ICD-10-CM | POA: Diagnosis not present

## 2019-06-10 DIAGNOSIS — G5602 Carpal tunnel syndrome, left upper limb: Secondary | ICD-10-CM | POA: Diagnosis not present

## 2019-06-13 DIAGNOSIS — G4733 Obstructive sleep apnea (adult) (pediatric): Secondary | ICD-10-CM | POA: Diagnosis not present

## 2019-06-16 DIAGNOSIS — L814 Other melanin hyperpigmentation: Secondary | ICD-10-CM | POA: Diagnosis not present

## 2019-06-16 DIAGNOSIS — D692 Other nonthrombocytopenic purpura: Secondary | ICD-10-CM | POA: Diagnosis not present

## 2019-06-16 DIAGNOSIS — D2271 Melanocytic nevi of right lower limb, including hip: Secondary | ICD-10-CM | POA: Diagnosis not present

## 2019-06-16 DIAGNOSIS — L821 Other seborrheic keratosis: Secondary | ICD-10-CM | POA: Diagnosis not present

## 2019-06-16 DIAGNOSIS — L400 Psoriasis vulgaris: Secondary | ICD-10-CM | POA: Diagnosis not present

## 2019-06-16 DIAGNOSIS — D2272 Melanocytic nevi of left lower limb, including hip: Secondary | ICD-10-CM | POA: Diagnosis not present

## 2019-06-16 DIAGNOSIS — D225 Melanocytic nevi of trunk: Secondary | ICD-10-CM | POA: Diagnosis not present

## 2019-06-16 DIAGNOSIS — D1801 Hemangioma of skin and subcutaneous tissue: Secondary | ICD-10-CM | POA: Diagnosis not present

## 2019-06-16 DIAGNOSIS — D2261 Melanocytic nevi of right upper limb, including shoulder: Secondary | ICD-10-CM | POA: Diagnosis not present

## 2019-07-14 DIAGNOSIS — G4733 Obstructive sleep apnea (adult) (pediatric): Secondary | ICD-10-CM | POA: Diagnosis not present

## 2019-08-13 DIAGNOSIS — G4733 Obstructive sleep apnea (adult) (pediatric): Secondary | ICD-10-CM | POA: Diagnosis not present

## 2019-09-09 ENCOUNTER — Other Ambulatory Visit: Payer: Self-pay | Admitting: Cardiology

## 2019-09-09 DIAGNOSIS — E7849 Other hyperlipidemia: Secondary | ICD-10-CM

## 2019-09-13 DIAGNOSIS — G4733 Obstructive sleep apnea (adult) (pediatric): Secondary | ICD-10-CM | POA: Diagnosis not present

## 2019-10-13 ENCOUNTER — Other Ambulatory Visit: Payer: Self-pay | Admitting: Family Medicine

## 2019-10-13 DIAGNOSIS — E559 Vitamin D deficiency, unspecified: Secondary | ICD-10-CM | POA: Diagnosis not present

## 2019-10-13 DIAGNOSIS — F17200 Nicotine dependence, unspecified, uncomplicated: Secondary | ICD-10-CM | POA: Diagnosis not present

## 2019-10-13 DIAGNOSIS — E041 Nontoxic single thyroid nodule: Secondary | ICD-10-CM

## 2019-10-13 DIAGNOSIS — R69 Illness, unspecified: Secondary | ICD-10-CM | POA: Diagnosis not present

## 2019-10-13 DIAGNOSIS — M810 Age-related osteoporosis without current pathological fracture: Secondary | ICD-10-CM | POA: Diagnosis not present

## 2019-10-13 DIAGNOSIS — Z23 Encounter for immunization: Secondary | ICD-10-CM | POA: Diagnosis not present

## 2019-10-13 DIAGNOSIS — E7800 Pure hypercholesterolemia, unspecified: Secondary | ICD-10-CM | POA: Diagnosis not present

## 2019-10-14 ENCOUNTER — Encounter (HOSPITAL_BASED_OUTPATIENT_CLINIC_OR_DEPARTMENT_OTHER): Payer: Self-pay | Admitting: Emergency Medicine

## 2019-10-14 ENCOUNTER — Emergency Department (HOSPITAL_BASED_OUTPATIENT_CLINIC_OR_DEPARTMENT_OTHER)
Admission: EM | Admit: 2019-10-14 | Discharge: 2019-10-14 | Disposition: A | Discharge status: Home / Self Care | Payer: Medicare HMO | Attending: Emergency Medicine | Admitting: Emergency Medicine

## 2019-10-14 ENCOUNTER — Other Ambulatory Visit: Payer: Self-pay

## 2019-10-14 DIAGNOSIS — R69 Illness, unspecified: Secondary | ICD-10-CM | POA: Diagnosis not present

## 2019-10-14 DIAGNOSIS — E871 Hypo-osmolality and hyponatremia: Secondary | ICD-10-CM | POA: Insufficient documentation

## 2019-10-14 DIAGNOSIS — Z79899 Other long term (current) drug therapy: Secondary | ICD-10-CM | POA: Insufficient documentation

## 2019-10-14 DIAGNOSIS — F17210 Nicotine dependence, cigarettes, uncomplicated: Secondary | ICD-10-CM | POA: Insufficient documentation

## 2019-10-14 DIAGNOSIS — R799 Abnormal finding of blood chemistry, unspecified: Secondary | ICD-10-CM | POA: Diagnosis present

## 2019-10-14 DIAGNOSIS — G4733 Obstructive sleep apnea (adult) (pediatric): Secondary | ICD-10-CM | POA: Diagnosis not present

## 2019-10-14 DIAGNOSIS — I10 Essential (primary) hypertension: Secondary | ICD-10-CM | POA: Insufficient documentation

## 2019-10-14 LAB — COMPREHENSIVE METABOLIC PANEL
ALT: 22 U/L (ref 0–44)
AST: 28 U/L (ref 15–41)
Albumin: 4.3 g/dL (ref 3.5–5.0)
Alkaline Phosphatase: 69 U/L (ref 38–126)
Anion gap: 13 (ref 5–15)
BUN: 9 mg/dL (ref 8–23)
CO2: 23 mmol/L (ref 22–32)
Calcium: 9.8 mg/dL (ref 8.9–10.3)
Chloride: 92 mmol/L — ABNORMAL LOW (ref 98–111)
Creatinine, Ser: 0.88 mg/dL (ref 0.44–1.00)
GFR calc Af Amer: >60 mL/min (ref >60)
GFR calc non Af Amer: >60 mL/min (ref >60)
Glucose, Bld: 107 mg/dL — ABNORMAL HIGH (ref 70–99)
Potassium: 4.2 mmol/L (ref 3.5–5.1)
Sodium: 128 mmol/L — ABNORMAL LOW (ref 135–145)
Total Bilirubin: 0.5 mg/dL (ref 0.3–1.2)
Total Protein: 8.2 g/dL — ABNORMAL HIGH (ref 6.5–8.1)

## 2019-10-14 LAB — CBC
HCT: 39.5 % (ref 36.0–46.0)
Hemoglobin: 14 g/dL (ref 12.0–15.0)
MCH: 33.7 pg (ref 26.0–34.0)
MCHC: 35.4 g/dL (ref 30.0–36.0)
MCV: 95.2 fL (ref 80.0–100.0)
Platelets: 256 10*3/uL (ref 150–400)
RBC: 4.15 MIL/uL (ref 3.87–5.11)
RDW: 12.2 % (ref 11.5–15.5)
WBC: 4.8 10*3/uL (ref 4.0–10.5)
nRBC: 0 % (ref 0.0–0.2)

## 2019-10-14 NOTE — ED Notes (Signed)
Pt was unable to provide urine at triage.

## 2019-10-14 NOTE — ED Triage Notes (Addendum)
Pt reports low Na at PCP, here for evaluation. asymptomatic

## 2019-10-14 NOTE — Discharge Instructions (Addendum)
Great to meet you today. You were sent to the ED with concerns for your sodium being low. It is 128 which was the same as you had yesterday at the doctors. You have no symptoms for the low sodium which is reassuring. The reasons for your low sodium could be one of the medications you are taking, high alcohol intake or something else. Would recommend a sodium test on Monday with your doctor and also further testing to find the cause of your sodium levels. Would recommend slowly reducing your alcohol intake.   If you develop headaches, vision changes, seizures, confusion, drowsiness/;lethargy, vomiting, diarrhea, fevers then please come to the ED immediately.

## 2019-10-14 NOTE — ED Provider Notes (Addendum)
[facility] EMERGENCY DEPARTMENT Provider Note   CSN: [id number] Arrival date & time: 10/14/19  1351     History Chief Complaint  Patient presents with  . Abnormal Lab    low Na    Gabriella Farrell is a 64 y.o. female.  Gabriella Farrell is a 64 yr old female with PMH of acute pancreatitis, depression, panic attacks, cholecystectomy, hypertension and hyperlipidemia presents with asymptomatic hyponatremia.  Patient with seen yesterday at primary care for routine lab work.  She was contacted this morning by her provider saying her sodium levels were low you need to go to the ED immediately for IV fluids.  Sodium yesterday was 128.  Patient has been asymptomatic.  Denies altered mental status such as headache, seizures, confusion etc. patient reports she had hyponatremia when she was taking fluoxetine.  She has since then been switched to duloxetine.  Denies headaches, visual changes, chest pain, palpitations, dizziness, abdominal pain, urinary symptoms, diarrhea or vomiting.   Denies Covid contacts.  Has received both her Covid vaccines.  Does endorse drinking 1-2 drinks of alcohol a day.  Has been hospitalized for acute pancreatitis for this reason in the past.  Smokes 1 pack of cigarettes a day.  Denies illicit drug use        Past Medical History:  Diagnosis Date  . Depression     Patient Active Problem List   Diagnosis Date Noted  . Tobacco use disorder 09/27/2018  . Excessive daytime sleepiness 09/27/2018  . Snoring 09/27/2018  . Anxiety 12/04/2014  . Depression 12/04/2014    Past Surgical History:  Procedure Laterality Date  . BREAST EXCISIONAL BIOPSY Right   . CHOLECYSTECTOMY    . PANCREAS SURGERY    . SHOULDER SURGERY       OB History   No obstetric history on file.     Family History  Problem Relation Age of Onset  . Cancer Mother   . Heart attack Mother 36  . Heart disease Sister   . Cancer - Lung Brother   . Melanoma Sister     Social  History   Tobacco Use  . Smoking status: Current Every Day Smoker    Packs/day: 1.00    Types: Cigarettes  . Smokeless tobacco: Never Used  Vaping Use  . Vaping Use: Never used  Substance Use Topics  . Alcohol use: Yes    Alcohol/week: 15.0 standard drinks    Types: 1 Glasses of wine, 14 Standard drinks or equivalent per week    Comment: daily  . Drug use: No    Home Medications Prior to Admission medications   Medication Sig Start Date End Date Taking? Authorizing Provider  ALPRAZolam Duanne Moron) 0.5 MG tablet Take 0.5 mg by mouth every 6 (six) hours as needed for anxiety.    [provider]  amLODipine (NORVASC) 5 MG tablet Take 1 tablet by mouth once daily 09/09/19   Adrian Prows, MD  Armodafinil (NUVIGIL) 250 MG tablet Take 250 mg by mouth daily.    [provider]  atorvastatin (LIPITOR) 20 MG tablet Take 1 tablet by mouth once daily 09/09/19   Adrian Prows, MD  Calcium-Magnesium-Vitamin D (CALCIUM 1200+D3 PO) Take 1 capsule by mouth daily.    [provider]  DULoxetine (CYMBALTA) 20 MG capsule daily.  08/07/18   [provider]  FLUoxetine (PROZAC) 20 MG capsule  11/15/18   [provider]  Multiple Vitamin (MULTIVITAMIN) capsule Take 1 capsule by mouth daily.  [provider]  pantoprazole (PROTONIX) 40 MG tablet Take 40 mg by mouth daily. 04/23/18   [provider]    Allergies    Patient has no known allergies.  Review of Systems   Review of Systems  Constitutional: Positive for fatigue. Negative for chills and fever.  Respiratory: Negative for cough and shortness of breath.   Cardiovascular: Negative for chest pain and palpitations.  Gastrointestinal: Negative for abdominal pain, constipation, nausea and vomiting.  Genitourinary: Negative for dysuria, frequency and hematuria.  Skin: Negative for pallor and rash.  Neurological: Negative for dizziness, seizures, weakness, light-headedness and headaches.    Psychiatric/Behavioral: Negative for agitation, behavioral problems and confusion.    Physical Exam Updated Vital Signs BP 132/69 (BP Location: Right Arm)   Pulse 79   Temp 97.7 F (36.5 C) (Oral)   Resp 16   Ht 5\' 6"  (1.676 m)   Wt 55.3 kg   SpO2 100%   BMI 19.69 kg/m   Physical Exam Constitutional:      Appearance: Normal appearance.  HENT:     Head: Normocephalic and atraumatic.     Nose: Nose normal.  Eyes:     Extraocular Movements: Extraocular movements intact.     Pupils: Pupils are equal, round, and reactive to light.  Cardiovascular:     Rate and Rhythm: Normal rate and regular rhythm.     Heart sounds: Normal heart sounds, S1 normal and S2 normal.  Pulmonary:     Effort: Pulmonary effort is normal.     Breath sounds: Normal breath sounds and air entry.  Abdominal:     General: Abdomen is flat. Bowel sounds are normal.     Palpations: Abdomen is soft.  Musculoskeletal:     Cervical back: Normal range of motion and neck supple.  Neurological:     General: No focal deficit present.     Mental Status: She is alert.     Comments: Normal cranial nerve exam   Psychiatric:        Mood and Affect: Mood normal.        Behavior: Behavior normal.     ED Results / Procedures / Treatments   Labs (all labs ordered are listed, but only abnormal results are displayed) Labs Reviewed  COMPREHENSIVE METABOLIC PANEL - Abnormal; Notable for the following components:      Result Value   Sodium 128 (*)    Chloride 92 (*)    Glucose, Bld 107 (*)    Total Protein 8.2 (*)    All other components within normal limits  CBC  URINALYSIS, ROUTINE W REFLEX MICROSCOPIC    EKG None  Radiology No results found.  Procedures Procedures (including critical care time)  Medications Ordered in ED Medications - No data to display  ED Course  I have reviewed the triage vital signs and the nursing notes.  Pertinent labs & imaging results that were available during my care  of the patient were reviewed by me and considered in my medical decision making (see chart for details).    MDM Rules/Calculators/A&P                         Gabriella Farrell is a 64 yr old female with PMH of acute pancreatitis, depression, panic attacks, cholecystectomy, hypertension and hyperlipidemia for asymptomatic hyponatremia.  Patient was sent to the ED today by her PCP for sodium of 128 and need for IV fluids.  Examination and vital signs  normal.  CMP shows sodium of 128. Creatinine 0.88. Unclear etiology of hyponatremia stop patient will need further work-up including serum and urine osmolalities.  Top differentials include SSNRI use and alcohol use.  Counseled patient on the need to reduce alcohol intake.  Would not recommend stopping alcohol intake and daily due to risk of withdrawal. Recommended she follows up on Monday with her PCP for repeat CMP and further work up. No indication at present for IV fluids. Discharged patient with strict safety precautions.  .  Patient expressed understanding and is happy with plan.    Final Clinical Impression(s) / ED Diagnoses Final diagnoses:  Hyponatremia    Rx / DC Orders ED Discharge Orders    None       Lattie Haw, MD 10/14/19 1847    Lattie Haw, MD 10/14/19 Randol Kern    Blanchie Dessert, MD 10/14/19 1949

## 2019-10-19 ENCOUNTER — Ambulatory Visit
Admission: RE | Admit: 2019-10-19 | Discharge: 2019-10-19 | Disposition: A | Discharge status: Home / Self Care | Payer: Medicare HMO | Source: Ambulatory Visit | Attending: Family Medicine | Admitting: Family Medicine

## 2019-10-19 DIAGNOSIS — E042 Nontoxic multinodular goiter: Secondary | ICD-10-CM | POA: Diagnosis not present

## 2019-10-19 DIAGNOSIS — E041 Nontoxic single thyroid nodule: Secondary | ICD-10-CM

## 2019-10-20 DIAGNOSIS — E871 Hypo-osmolality and hyponatremia: Secondary | ICD-10-CM | POA: Diagnosis not present

## 2019-10-26 ENCOUNTER — Other Ambulatory Visit: Payer: Self-pay | Admitting: Family Medicine

## 2019-10-26 DIAGNOSIS — IMO0001 Reserved for inherently not codable concepts without codable children: Secondary | ICD-10-CM

## 2019-10-26 DIAGNOSIS — R911 Solitary pulmonary nodule: Secondary | ICD-10-CM

## 2019-11-04 DIAGNOSIS — M8589 Other specified disorders of bone density and structure, multiple sites: Secondary | ICD-10-CM | POA: Diagnosis not present

## 2019-11-18 ENCOUNTER — Ambulatory Visit
Admission: RE | Admit: 2019-11-18 | Discharge: 2019-11-18 | Disposition: A | Discharge status: Home / Self Care | Payer: Medicare HMO | Source: Ambulatory Visit | Attending: Family Medicine | Admitting: Family Medicine

## 2019-11-18 DIAGNOSIS — R911 Solitary pulmonary nodule: Secondary | ICD-10-CM

## 2019-11-18 DIAGNOSIS — IMO0001 Reserved for inherently not codable concepts without codable children: Secondary | ICD-10-CM

## 2019-11-18 DIAGNOSIS — I2510 Atherosclerotic heart disease of native coronary artery without angina pectoris: Secondary | ICD-10-CM | POA: Diagnosis not present

## 2019-11-18 DIAGNOSIS — I700 Atherosclerosis of aorta: Secondary | ICD-10-CM | POA: Diagnosis not present

## 2019-11-18 DIAGNOSIS — J432 Centrilobular emphysema: Secondary | ICD-10-CM | POA: Diagnosis not present

## 2019-11-18 DIAGNOSIS — R918 Other nonspecific abnormal finding of lung field: Secondary | ICD-10-CM | POA: Diagnosis not present

## 2019-12-07 DIAGNOSIS — I10 Essential (primary) hypertension: Secondary | ICD-10-CM | POA: Diagnosis not present

## 2019-12-07 DIAGNOSIS — E871 Hypo-osmolality and hyponatremia: Secondary | ICD-10-CM | POA: Diagnosis not present

## 2020-01-05 DIAGNOSIS — E871 Hypo-osmolality and hyponatremia: Secondary | ICD-10-CM | POA: Diagnosis not present

## 2020-01-12 DIAGNOSIS — L218 Other seborrheic dermatitis: Secondary | ICD-10-CM | POA: Diagnosis not present

## 2020-01-12 DIAGNOSIS — I10 Essential (primary) hypertension: Secondary | ICD-10-CM | POA: Diagnosis not present

## 2020-01-12 DIAGNOSIS — E222 Syndrome of inappropriate secretion of antidiuretic hormone: Secondary | ICD-10-CM | POA: Diagnosis not present

## 2020-01-12 DIAGNOSIS — R21 Rash and other nonspecific skin eruption: Secondary | ICD-10-CM | POA: Diagnosis not present

## 2020-01-12 DIAGNOSIS — E871 Hypo-osmolality and hyponatremia: Secondary | ICD-10-CM | POA: Diagnosis not present

## 2020-02-23 DIAGNOSIS — L218 Other seborrheic dermatitis: Secondary | ICD-10-CM | POA: Diagnosis not present

## 2020-02-23 DIAGNOSIS — L821 Other seborrheic keratosis: Secondary | ICD-10-CM | POA: Diagnosis not present

## 2020-03-29 DIAGNOSIS — L218 Other seborrheic dermatitis: Secondary | ICD-10-CM | POA: Diagnosis not present

## 2020-03-29 DIAGNOSIS — R1319 Other dysphagia: Secondary | ICD-10-CM | POA: Diagnosis not present

## 2020-03-29 DIAGNOSIS — R69 Illness, unspecified: Secondary | ICD-10-CM | POA: Diagnosis not present

## 2020-03-29 DIAGNOSIS — Z86010 Personal history of colonic polyps: Secondary | ICD-10-CM | POA: Diagnosis not present

## 2020-03-29 DIAGNOSIS — L930 Discoid lupus erythematosus: Secondary | ICD-10-CM | POA: Diagnosis not present

## 2020-04-10 DIAGNOSIS — L930 Discoid lupus erythematosus: Secondary | ICD-10-CM | POA: Diagnosis not present

## 2020-04-12 DIAGNOSIS — E7800 Pure hypercholesterolemia, unspecified: Secondary | ICD-10-CM | POA: Diagnosis not present

## 2020-04-12 DIAGNOSIS — R69 Illness, unspecified: Secondary | ICD-10-CM | POA: Diagnosis not present

## 2020-04-12 DIAGNOSIS — K860 Alcohol-induced chronic pancreatitis: Secondary | ICD-10-CM | POA: Diagnosis not present

## 2020-04-12 DIAGNOSIS — R911 Solitary pulmonary nodule: Secondary | ICD-10-CM | POA: Diagnosis not present

## 2020-04-25 DIAGNOSIS — L218 Other seborrheic dermatitis: Secondary | ICD-10-CM | POA: Diagnosis not present

## 2020-04-25 DIAGNOSIS — L930 Discoid lupus erythematosus: Secondary | ICD-10-CM | POA: Diagnosis not present

## 2020-05-16 DIAGNOSIS — H5203 Hypermetropia, bilateral: Secondary | ICD-10-CM | POA: Diagnosis not present

## 2020-05-16 DIAGNOSIS — H16223 Keratoconjunctivitis sicca, not specified as Sjogren's, bilateral: Secondary | ICD-10-CM | POA: Diagnosis not present

## 2020-05-22 DIAGNOSIS — L930 Discoid lupus erythematosus: Secondary | ICD-10-CM | POA: Diagnosis not present

## 2020-05-22 DIAGNOSIS — L570 Actinic keratosis: Secondary | ICD-10-CM | POA: Diagnosis not present

## 2020-06-04 IMAGING — MG DIGITAL SCREENING BILAT W/ TOMO W/ CAD
8 series · 8 of 24 positions shown · non-contrast
Comparison: Previous exam(s).

CLINICAL DATA: Screening.

EXAM:
DIGITAL SCREENING BILATERAL MAMMOGRAM WITH TOMO AND CAD

[L MLO synth-2D]
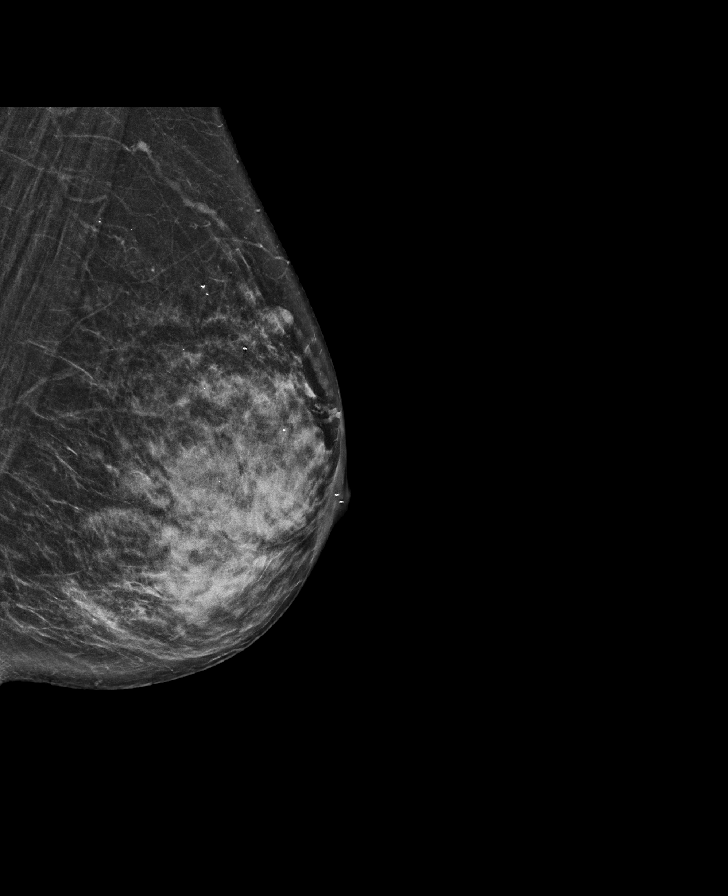

[R CC synth-2D]
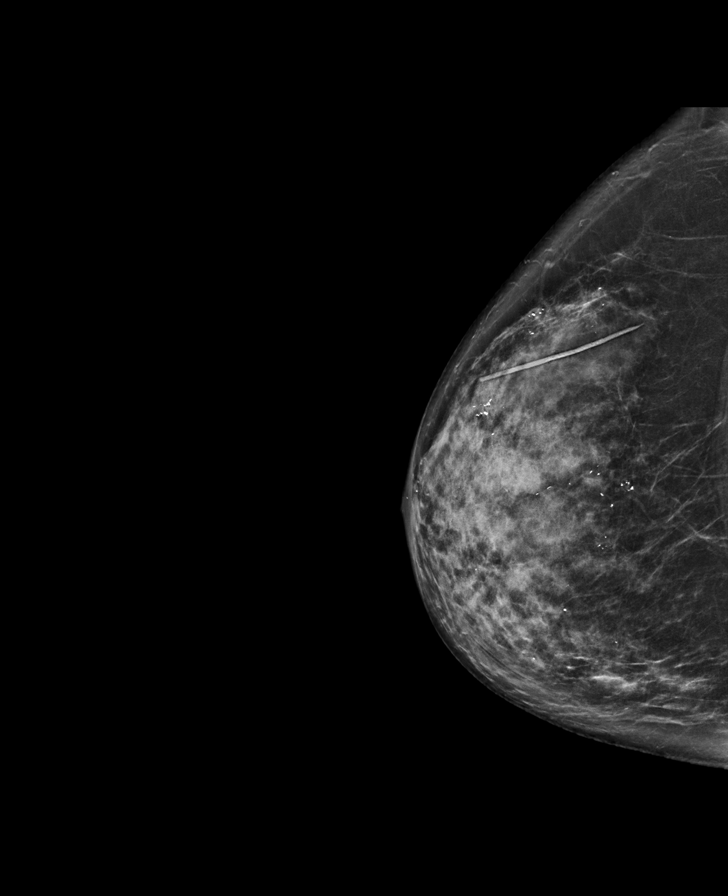

[R MLO synth-2D]
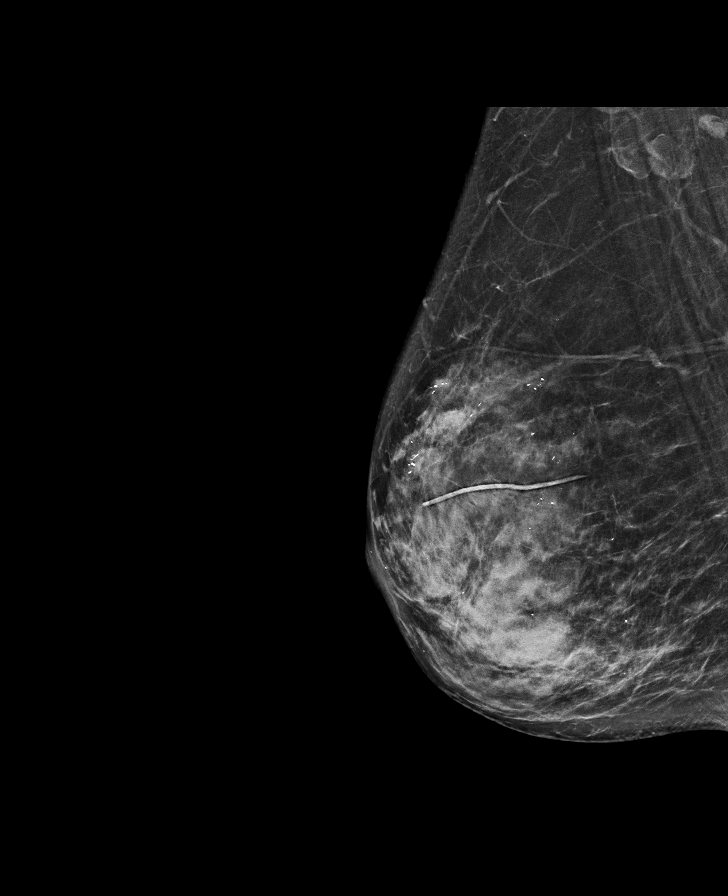

[L CC synth-2D]
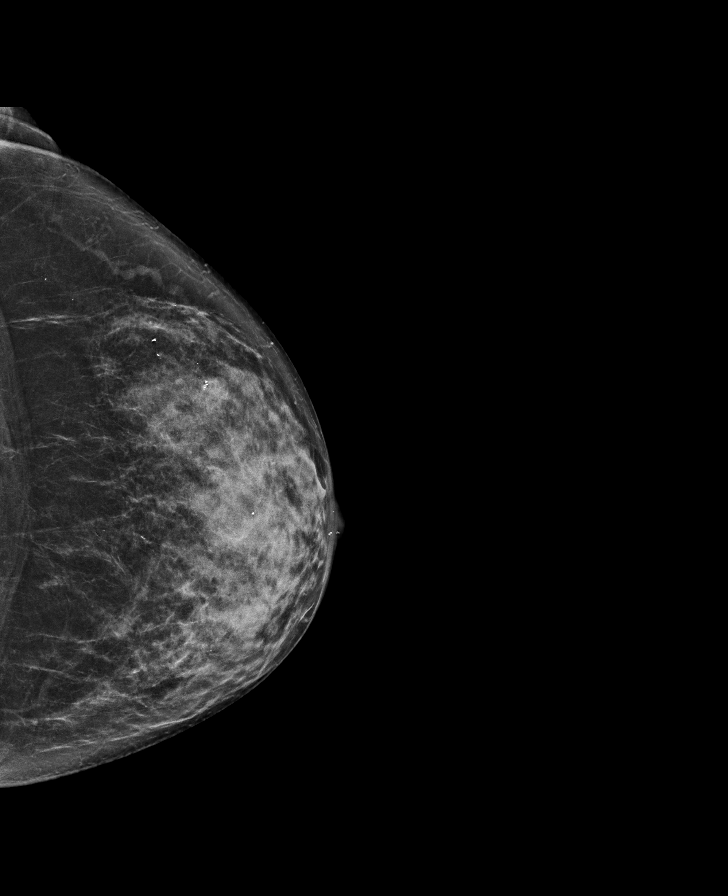

[L CC tomo · tomo slice 37/72.0]
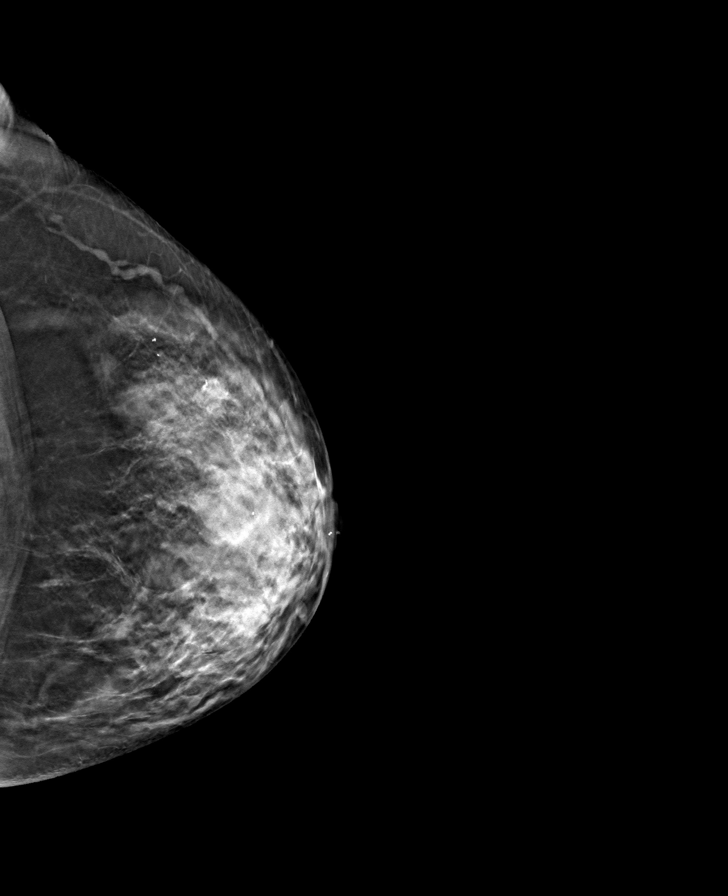

[R CC tomo · tomo slice 39/76.0]
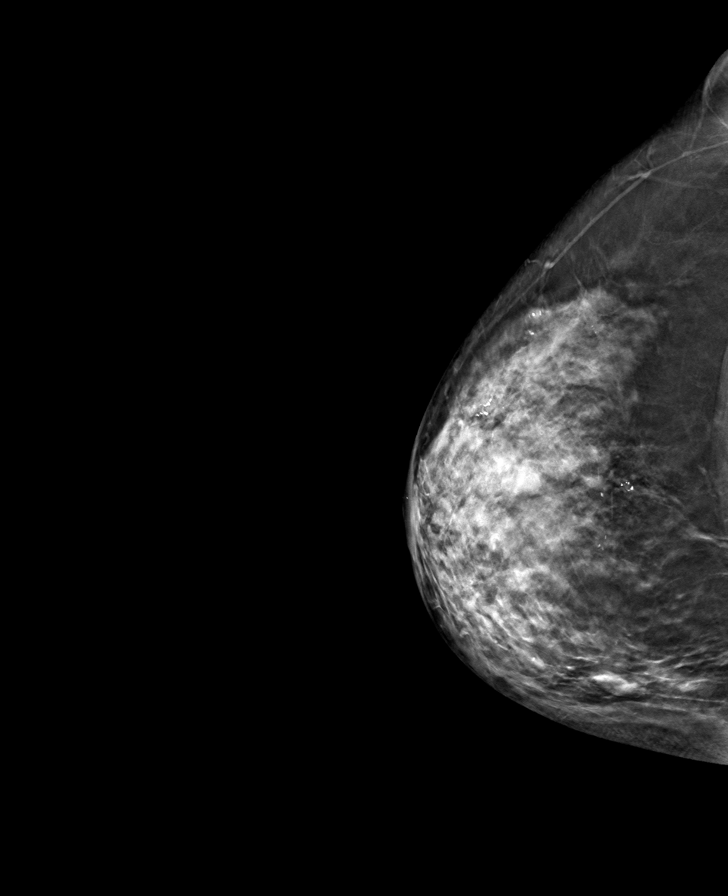

[R MLO tomo · tomo slice 37/74.0]
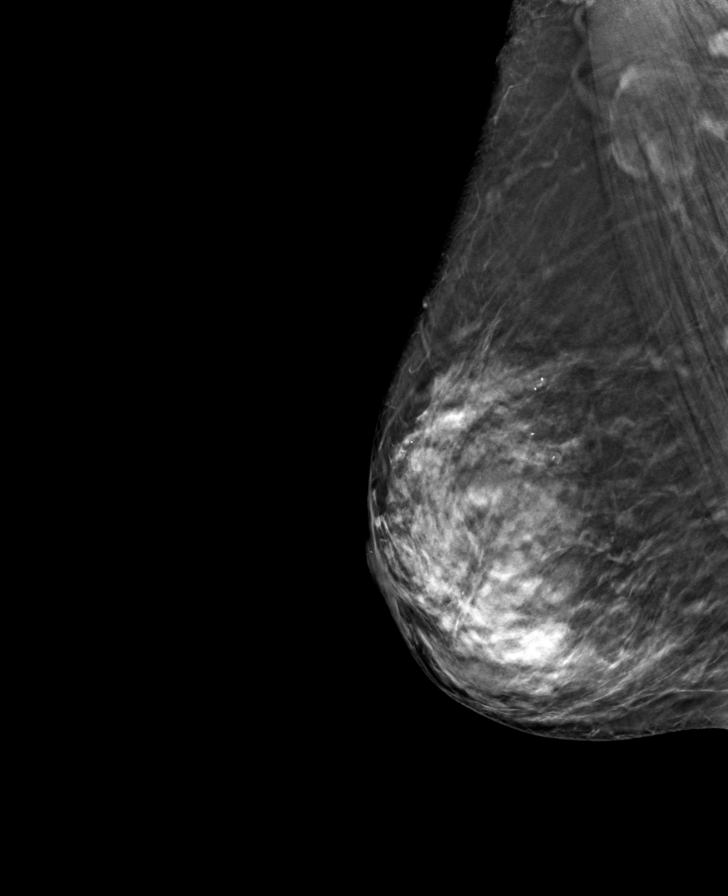

[L MLO tomo · tomo slice 34/67.0]
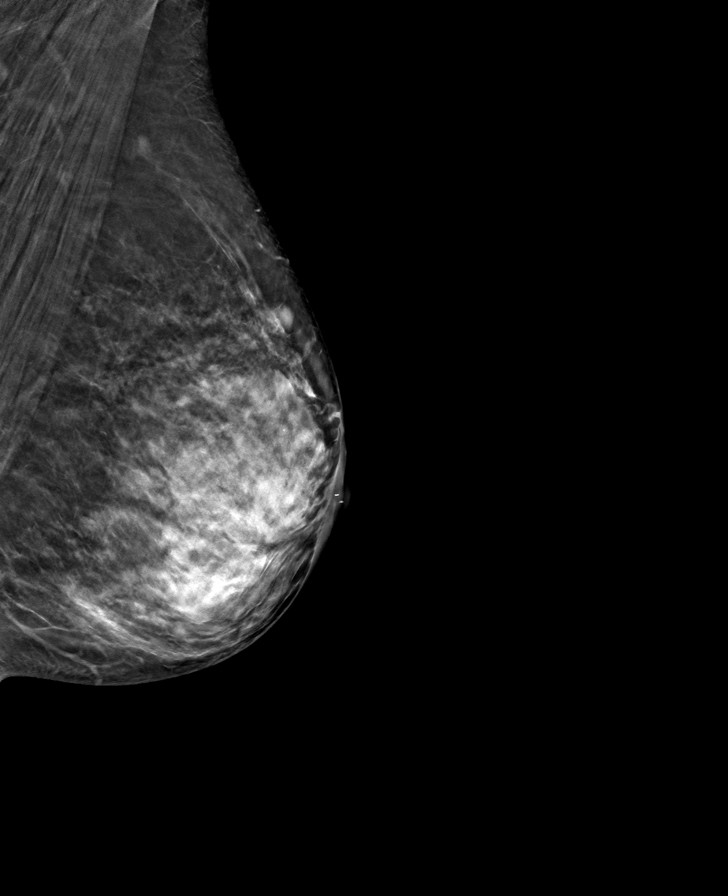

[8 of 24 positions shown; findings below may reference images not displayed]

ACR Breast Density Category c: The breast tissue is heterogeneously
dense, which may obscure small masses.
FINDINGS: There are no findings suspicious for malignancy. Images were
processed with CAD.
IMPRESSION: No mammographic evidence of malignancy. A result letter of this
screening mammogram will be mailed directly to the patient.

RECOMMENDATION:
Screening mammogram in one year. (Code:[id number]-U-LHB)

BI-RADS CATEGORY  1: Negative.

## 2020-08-23 IMAGING — CT CT CHEST W/O CM
2 of 4 series · 11 of 36 positions shown, 13 images · non-contrast
Comparison: CT scan of the left shoulder dated 03/17/2019 and CT
scan of the abdomen dated 10/10/2009
COMPARISON: CT scan of the left shoulder dated 03/17/2019 and CT
scan of the abdomen dated 10/10/2009

CLINICAL DATA: Follow-up of pulmonary nodule.

EXAM:
CT CHEST WITHOUT CONTRAST
TECHNIQUE: Multidetector CT imaging of the chest was performed following the
standard protocol without IV contrast.

[Series 2: chest 2.00 br40 s3 · axial · 0.53mm/px · z∈[+1521,+1793]mm · 8 of 162 slices shown, 10 images (1 of 2)]
[im 13/162  mediastinal]
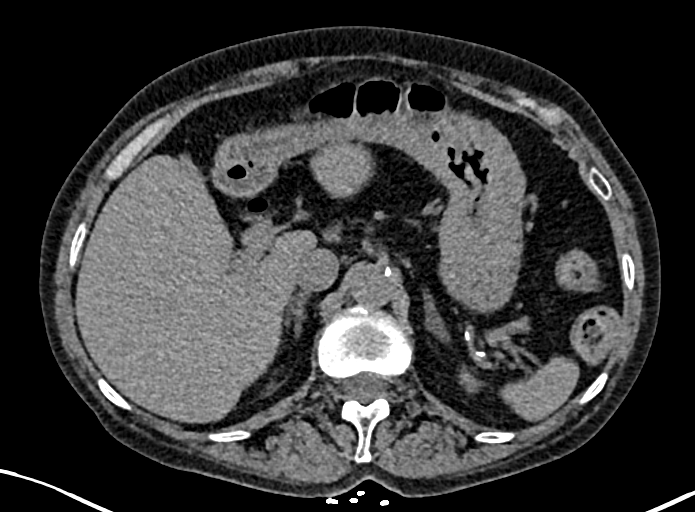
[im 13/162  lung]
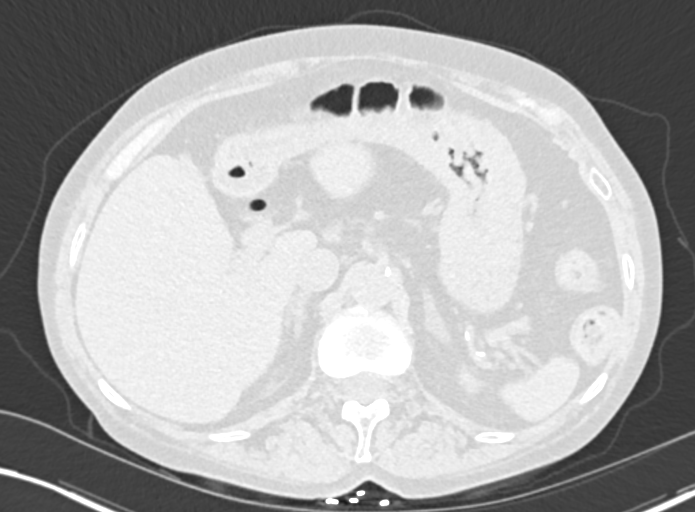
[im 38/162  lung]
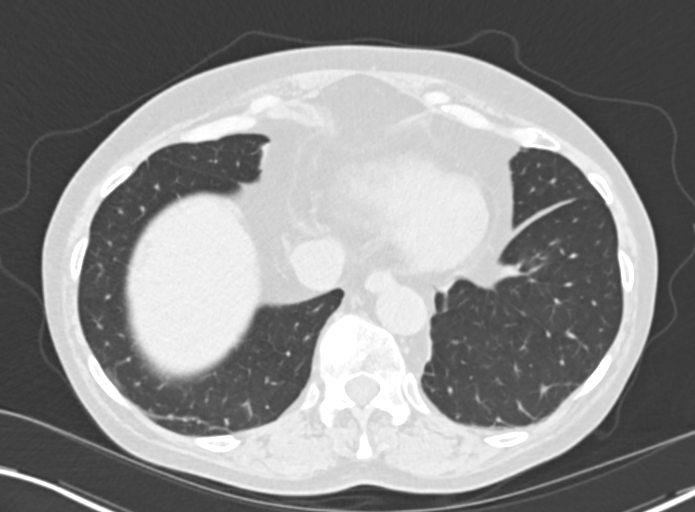
[im 50/162  lung]
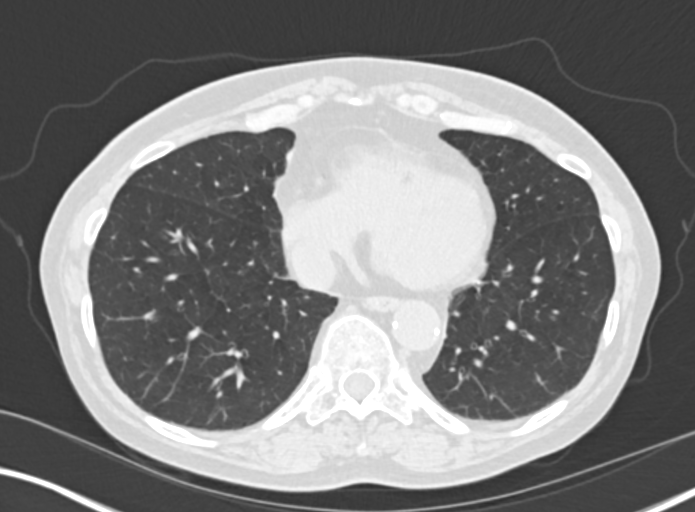
[im 75/162  lung]
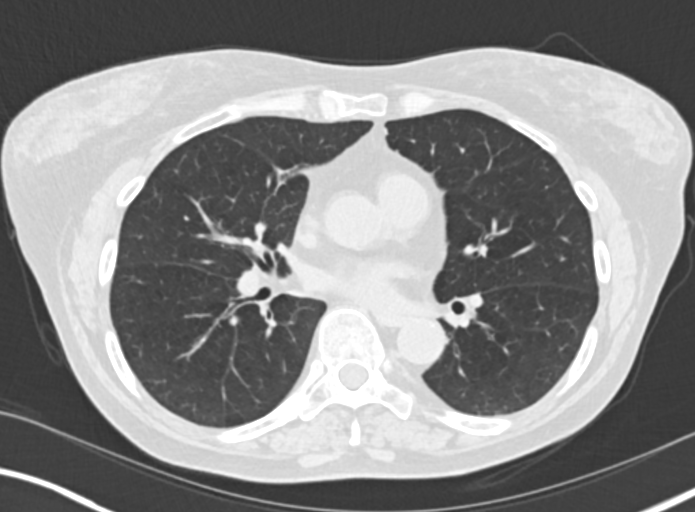
[im 87/162  mediastinal]
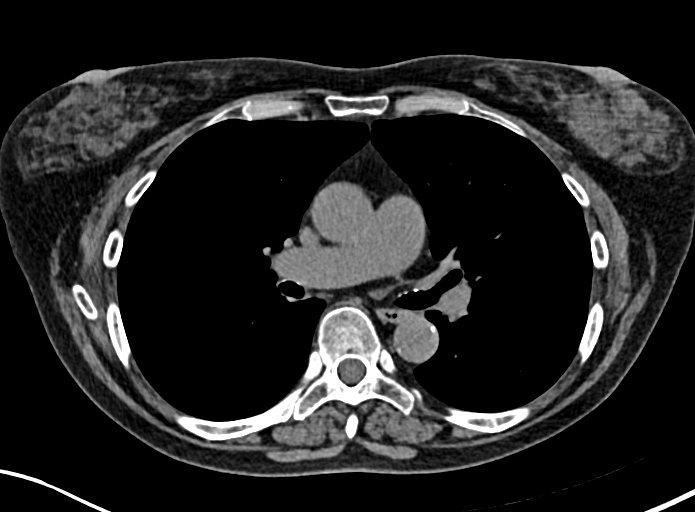
[im 87/162  lung]
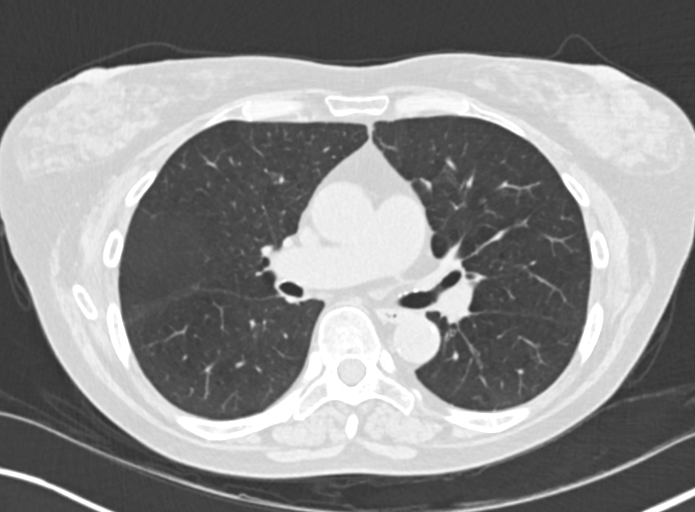
[im 112/162  lung]
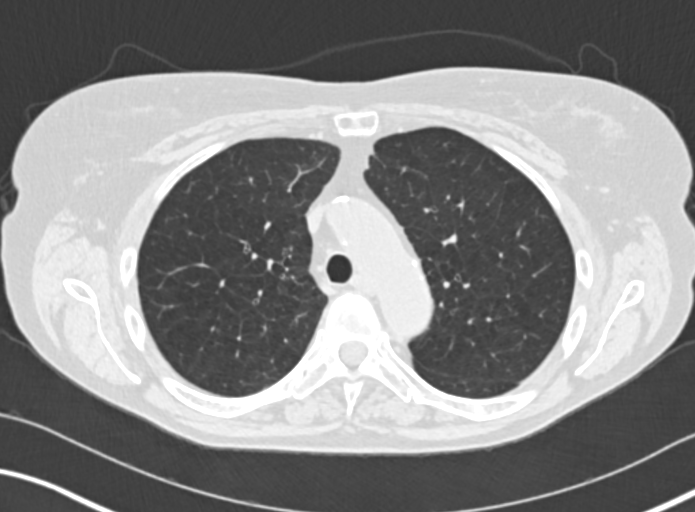
[im 124/162  lung]
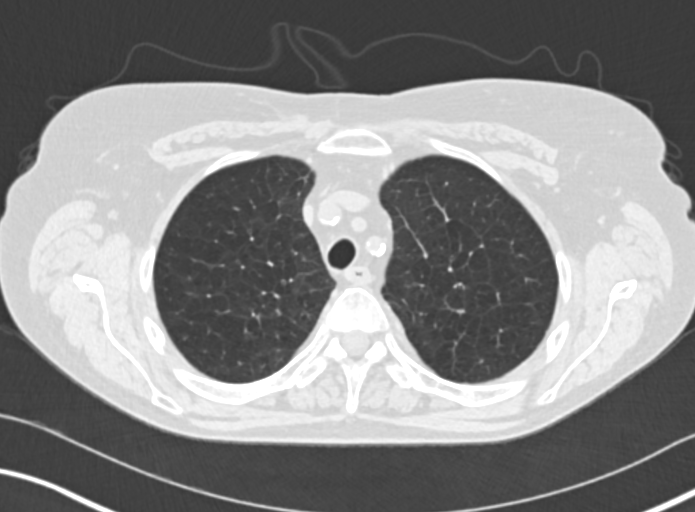
[im 149/162  lung]
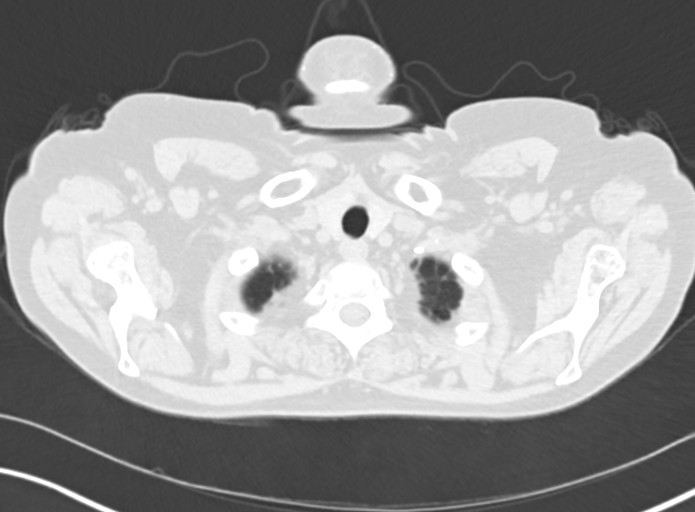

[Series 4: chest 2.00 br40 s3 · coronal · 0.64mm/px · 3 of 134 slices shown (2 of 2)]
[im 27/134  lung]
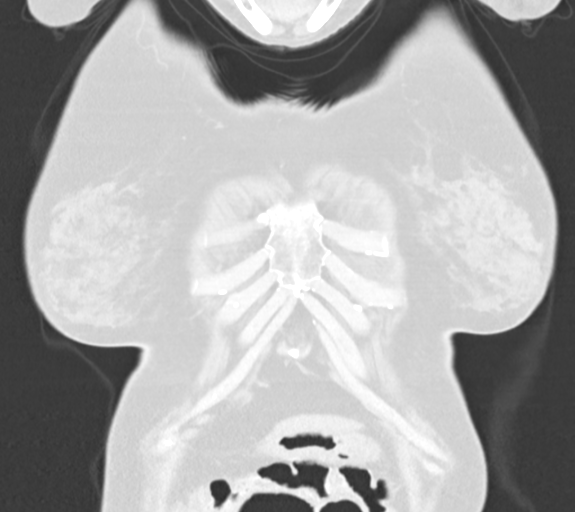
[im 54/134  lung]
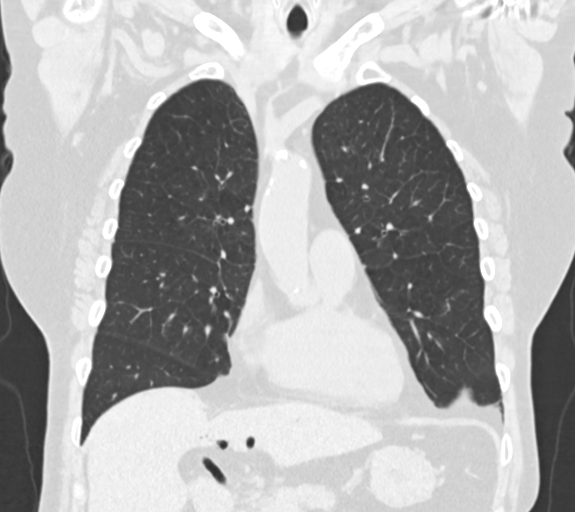
[im 80/134  lung]
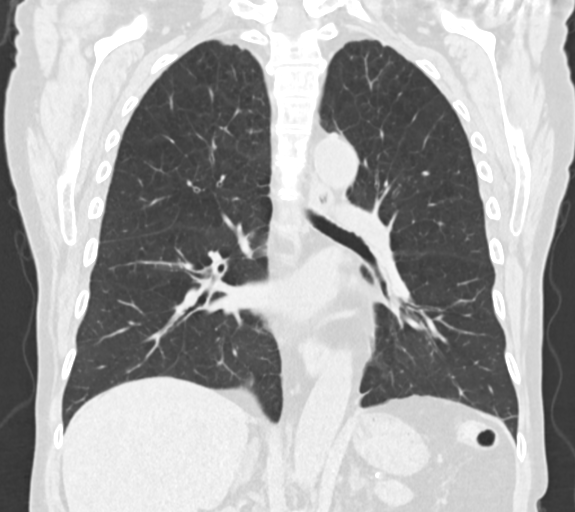

[11 of 36 positions shown; findings below may reference images not displayed]

FINDINGS: Cardiovascular: Aortic atherosclerosis. Coronary artery
calcifications. Heart size is normal. No pericardial effusion.

Mediastinum/Nodes: No enlarged mediastinal or axillary lymph nodes.
Thyroid gland, trachea, and esophagus demonstrate no significant
findings.

Lungs/Pleura: Well-defined smoothly marginated 6 mm nodule in the
lingula of the left upper lobe. Faint 5 mm ground-glass density in
the right lower lobe on image 83 of series 8.

The lungs are otherwise clear. Extensive emphysematous changes in
the upper lobes. No pleural effusions.

Upper Abdomen: No acute abnormality. Chronic air in the biliary
tree. Previous cholecystectomy.

Musculoskeletal: No chest wall mass or suspicious bone lesions
identified.
FINDINGS: Cardiovascular: Aortic atherosclerosis. Coronary artery
calcifications. Heart size is normal. No pericardial effusion.

Mediastinum/Nodes: No enlarged mediastinal or axillary lymph nodes.
Thyroid gland, trachea, and esophagus demonstrate no significant
findings.

Lungs/Pleura: Well-defined smoothly marginated 6 mm nodule in the
lingula of the left upper lobe. Faint 5 mm ground-glass density in
the right lower lobe on image 83 of series 8.

The lungs are otherwise clear. Extensive emphysematous changes in
the upper lobes. No pleural effusions.

Upper Abdomen: No acute abnormality. Chronic air in the biliary
tree. Previous cholecystectomy.

Musculoskeletal: No chest wall mass or suspicious bone lesions
identified.
IMPRESSION: 1. 6 mm smoothly marginated nodule in the lingula of the left upper
lobe.
2. 5 mm ground-glass density in the right lower lobe. Follow-up CT
scan of the chest without contrast is recommended in 6 months.
3. Extensive emphysematous changes in the upper lobes.
4. Aortic atherosclerosis. Coronary artery calcifications.
5. Chronic air in the biliary tree.
6. Previous cholecystectomy.
7. Emphysema and aortic atherosclerosis.
8. Non-contrast chest CT at 6-12 months is recommended. If the
nodule is stable at time of repeat CT, then future CT at 18-24
months (from today's scan) is considered optional for low-risk
patients, but is recommended for high-risk patients. This
recommendation follows the consensus statement: Guidelines for
Management of Incidental Pulmonary Nodules Detected on CT Images:

Aortic Atherosclerosis (41V3V-YID.D) and Emphysema (41V3V-KLK.Z).

## 2020-11-02 DIAGNOSIS — K2930 Chronic superficial gastritis without bleeding: Secondary | ICD-10-CM | POA: Diagnosis not present

## 2020-11-02 DIAGNOSIS — R1310 Dysphagia, unspecified: Secondary | ICD-10-CM | POA: Diagnosis not present

## 2020-11-02 DIAGNOSIS — K5730 Diverticulosis of large intestine without perforation or abscess without bleeding: Secondary | ICD-10-CM | POA: Diagnosis not present

## 2020-11-02 DIAGNOSIS — K259 Gastric ulcer, unspecified as acute or chronic, without hemorrhage or perforation: Secondary | ICD-10-CM | POA: Diagnosis not present

## 2020-11-02 DIAGNOSIS — K3189 Other diseases of stomach and duodenum: Secondary | ICD-10-CM | POA: Diagnosis not present

## 2020-11-02 DIAGNOSIS — Z86010 Personal history of colonic polyps: Secondary | ICD-10-CM | POA: Diagnosis not present

## 2020-11-07 DIAGNOSIS — K2930 Chronic superficial gastritis without bleeding: Secondary | ICD-10-CM | POA: Diagnosis not present

## 2020-12-11 DIAGNOSIS — D1801 Hemangioma of skin and subcutaneous tissue: Secondary | ICD-10-CM | POA: Diagnosis not present

## 2020-12-11 DIAGNOSIS — L930 Discoid lupus erythematosus: Secondary | ICD-10-CM | POA: Diagnosis not present

## 2020-12-11 DIAGNOSIS — L821 Other seborrheic keratosis: Secondary | ICD-10-CM | POA: Diagnosis not present

## 2020-12-11 DIAGNOSIS — D2271 Melanocytic nevi of right lower limb, including hip: Secondary | ICD-10-CM | POA: Diagnosis not present

## 2020-12-11 DIAGNOSIS — L814 Other melanin hyperpigmentation: Secondary | ICD-10-CM | POA: Diagnosis not present

## 2020-12-11 DIAGNOSIS — D225 Melanocytic nevi of trunk: Secondary | ICD-10-CM | POA: Diagnosis not present

## 2020-12-11 DIAGNOSIS — D2272 Melanocytic nevi of left lower limb, including hip: Secondary | ICD-10-CM | POA: Diagnosis not present

## 2021-01-08 DIAGNOSIS — L930 Discoid lupus erythematosus: Secondary | ICD-10-CM | POA: Diagnosis not present

## 2021-01-18 ENCOUNTER — Encounter: Payer: Self-pay | Admitting: Pulmonary Disease

## 2021-01-18 ENCOUNTER — Other Ambulatory Visit: Payer: Self-pay

## 2021-01-18 ENCOUNTER — Ambulatory Visit (INDEPENDENT_AMBULATORY_CARE_PROVIDER_SITE_OTHER): Payer: Medicare HMO | Admitting: Pulmonary Disease

## 2021-01-18 DIAGNOSIS — R918 Other nonspecific abnormal finding of lung field: Secondary | ICD-10-CM | POA: Diagnosis not present

## 2021-01-18 DIAGNOSIS — J432 Centrilobular emphysema: Secondary | ICD-10-CM

## 2021-01-18 DIAGNOSIS — Z720 Tobacco use: Secondary | ICD-10-CM | POA: Diagnosis not present

## 2021-01-18 NOTE — Progress Notes (Signed)
Synopsis: Referred in December 2022 for pulmonary nodule by Marda Stalker, PA-C  Subjective:   PATIENT ID: Gabriella Farrell GENDER: female DOB: 24-Dec-1955, MRN: [id number]  Chief Complaint  Patient presents with   Lung Lesion    Ct from ed      This is a 65 year old female, past medical history of anxiety and depression, longstanding history of tobacco abuse, 40+ years.  Last October in 2021 she was seen in the emergency department.  She had a CT scan of the chest which revealed 2 pulmonary nodules.  She had a left-sided lingular rounded lung nodule at about 6 mm in size it had smooth edges.  She had a right lower lobe what appears to be some solid type lesion that was approximately 5 mm in size which recommended attention to detail for follow-up.  She is a longstanding history of tobacco abuse and she is still a current every day smoker.  She also had CT radiographic evidence of centrilobular emphysema consistent with underlying diagnosis of COPD.  She has no PFTs on file.  And does not use her regular inhaler.  No other history of malignancy.   Past Medical History:  Diagnosis Date   Depression      Family History  Problem Relation Age of Onset   Cancer Mother    Heart attack Mother 68   Heart disease Sister    Cancer - Lung Brother    Melanoma Sister      Past Surgical History:  Procedure Laterality Date   BREAST EXCISIONAL BIOPSY Right    CHOLECYSTECTOMY     PANCREAS SURGERY     SHOULDER SURGERY      Social History   Socioeconomic History   Marital status: Married    Spouse name: Not on file   Number of children: 2   Years of education: Not on file   Highest education level: Not on file  Occupational History   Not on file  Tobacco Use   Smoking status: Every Day    Packs/day: 1.00    Types: Cigarettes   Smokeless tobacco: Never  Vaping Use   Vaping Use: Never used  Substance and Sexual Activity   Alcohol use: Yes    Alcohol/week: 15.0 standard drinks     Types: 1 Glasses of wine, 14 Standard drinks or equivalent per week    Comment: daily   Drug use: No   Sexual activity: Not on file  Other Topics Concern   Not on file  Social History Narrative   Not on file   Social Determinants of Health   Financial Resource Strain: Not on file  Food Insecurity: Not on file  Transportation Needs: Not on file  Physical Activity: Not on file  Stress: Not on file  Social Connections: Not on file  Intimate Partner Violence: Not on file     No Known Allergies   Outpatient Medications Prior to Visit  Medication Sig Dispense Refill   ALPRAZolam (XANAX) 0.5 MG tablet Take 0.5 mg by mouth every 6 (six) hours as needed for anxiety.     amLODipine (NORVASC) 5 MG tablet Take 1 tablet by mouth once daily 90 tablet 0   Armodafinil (NUVIGIL) 250 MG tablet Take 250 mg by mouth daily.     atorvastatin (LIPITOR) 20 MG tablet Take 1 tablet by mouth once daily 90 tablet 0   Calcium-Magnesium-Vitamin D (CALCIUM 1200+D3 PO) Take 1 capsule by mouth daily.     DULoxetine (CYMBALTA) 20 MG capsule  daily.      FLUoxetine (PROZAC) 20 MG capsule      Multiple Vitamin (MULTIVITAMIN) capsule Take 1 capsule by mouth daily.     pantoprazole (PROTONIX) 40 MG tablet Take 40 mg by mouth daily.     No facility-administered medications prior to visit.    Review of Systems  Constitutional:  Negative for chills, fever, malaise/fatigue and weight loss.  HENT:  Negative for hearing loss, sore throat and tinnitus.   Eyes:  Negative for blurred vision and double vision.  Respiratory:  Negative for cough, hemoptysis, sputum production, shortness of breath, wheezing and stridor.   Cardiovascular:  Negative for chest pain, palpitations, orthopnea, leg swelling and PND.  Gastrointestinal:  Negative for abdominal pain, constipation, diarrhea, heartburn, nausea and vomiting.  Genitourinary:  Negative for dysuria, hematuria and urgency.  Musculoskeletal:  Negative for joint pain and  myalgias.  Skin:  Negative for itching and rash.  Neurological:  Negative for dizziness, tingling, weakness and headaches.  Endo/Heme/Allergies:  Negative for environmental allergies. Does not bruise/bleed easily.  Psychiatric/Behavioral:  Negative for depression. The patient is not nervous/anxious and does not have insomnia.   All other systems reviewed and are negative.   Objective:  Physical Exam Vitals reviewed.  Constitutional:      General: She is not in acute distress.    Appearance: She is well-developed.  HENT:     Head: Normocephalic and atraumatic.  Eyes:     General: No scleral icterus.    Conjunctiva/sclera: Conjunctivae normal.     Pupils: Pupils are equal, round, and reactive to light.  Neck:     Vascular: No JVD.     Trachea: No tracheal deviation.  Cardiovascular:     Rate and Rhythm: Normal rate and regular rhythm.     Heart sounds: Normal heart sounds. No murmur heard. Pulmonary:     Effort: Pulmonary effort is normal. No tachypnea, accessory muscle usage or respiratory distress.     Breath sounds: No stridor. No wheezing, rhonchi or rales.     Comments: Diminished breath sounds bilaterally no wheeze Abdominal:     General: Bowel sounds are normal. There is no distension.     Palpations: Abdomen is soft.     Tenderness: There is no abdominal tenderness.  Musculoskeletal:        General: No tenderness.     Cervical back: Neck supple.  Lymphadenopathy:     Cervical: No cervical adenopathy.  Skin:    General: Skin is warm and dry.     Capillary Refill: Capillary refill takes less than 2 seconds.     Findings: No rash.  Neurological:     Mental Status: She is alert and oriented to person, place, and time.  Psychiatric:        Behavior: Behavior normal.     There were no vitals filed for this visit.   on RA BMI Readings from Last 3 Encounters:  10/14/19 19.69 kg/m  02/28/19 21.37 kg/m  11/18/18 20.66 kg/m   Wt Readings from Last 3 Encounters:   10/14/19 122 lb (55.3 kg)  02/28/19 132 lb 6.4 oz (60.1 kg)  11/18/18 128 lb (58.1 kg)     CBC    Component Value Date/Time   WBC 4.8 10/14/2019 1448   RBC 4.15 10/14/2019 1448   HGB 14.0 10/14/2019 1448   HCT 39.5 10/14/2019 1448   PLT 256 10/14/2019 1448   MCV 95.2 10/14/2019 1448   MCH 33.7 10/14/2019 1448   MCHC  35.4 10/14/2019 1448   RDW 12.2 10/14/2019 1448   LYMPHSABS 2.0 10/10/2009 1723   MONOABS 0.8 10/10/2009 1723   EOSABS 0.2 10/10/2009 1723   BASOSABS 0.1 10/10/2009 1723     Chest Imaging: October 2021: CT chest: 6 mm rounded lung nodule smooth borders in the lingula.  A subsolid lesion within the right lower lobe at approximately 5 mm in size, evidence of moderate to severe centrilobular emphysema predominantly within the upper lobe. The patient's images have been independently reviewed by me.    Pulmonary Functions Testing Results: No flowsheet data found.  FeNO:   Pathology:   Echocardiogram:   Heart Catheterization:     Assessment & Plan:     ICD-10-CM   1. Multiple lung nodules  R91.8 CT Super D Chest Wo Contrast    2. Centrilobular emphysema ([place])  J43.2     3. Tobacco use  Z72.0       Discussion:  This is a 65 year old female, long history of tobacco abuse, multiple pulmonary nodules 1 within the left lung within the right.  She has centrilobular emphysema predominantly located in the upper lobes consistent with an underlying diagnosis of COPD.  Plan: As for her pulmonary nodule she needs a follow-up CT imaging to see if these have changed. We discussed this today in detail. Patient is agreeable to this plan. Also talked about smoking cessation. Once we have more information regarding the lung nodules we can come up with a better plan on whether or not she needs continued follow-up or even considerations for biopsy if the lung nodule in the lower lobe has changed at all. The smooth nodule within the left is less concerning to me but  at this could in fact represent a carcinoid tumor.   We will plan for CT chest and then follow-up to see [place] in approximately 4 weeks.   Current Outpatient Medications:    ALPRAZolam (XANAX) 0.5 MG tablet, Take 0.5 mg by mouth every 6 (six) hours as needed for anxiety., Disp: , Rfl:    amLODipine (NORVASC) 5 MG tablet, Take 1 tablet by mouth once daily, Disp: 90 tablet, Rfl: 0   Armodafinil (NUVIGIL) 250 MG tablet, Take 250 mg by mouth daily., Disp: , Rfl:    atorvastatin (LIPITOR) 20 MG tablet, Take 1 tablet by mouth once daily, Disp: 90 tablet, Rfl: 0   Calcium-Magnesium-Vitamin D (CALCIUM 1200+D3 PO), Take 1 capsule by mouth daily., Disp: , Rfl:    DULoxetine (CYMBALTA) 20 MG capsule, daily. , Disp: , Rfl:    FLUoxetine (PROZAC) 20 MG capsule, , Disp: , Rfl:    Multiple Vitamin (MULTIVITAMIN) capsule, Take 1 capsule by mouth daily., Disp: , Rfl:    pantoprazole (PROTONIX) 40 MG tablet, Take 40 mg by mouth daily., Disp: , Rfl:     Garner Nash, DO [facility] 01/18/2021 2:53 PM

## 2021-01-18 NOTE — Patient Instructions (Signed)
Thank you for visiting Dr. Valeta Harms at [facility]. Today we recommend the following:  Orders Placed This Encounter  Procedures   CT Super D Chest Wo Contrast    Return in about 19 days (around 02/06/2021) for with Eric Form, NP, or Dr. Valeta Harms. Try to schedule same day as husband.     Please do your part to reduce the spread of COVID-19.

## 2021-01-31 ENCOUNTER — Ambulatory Visit (HOSPITAL_BASED_OUTPATIENT_CLINIC_OR_DEPARTMENT_OTHER)
Admission: RE | Admit: 2021-01-31 | Discharge: 2021-01-31 | Disposition: A | Discharge status: Home / Self Care | Payer: Medicare HMO | Source: Ambulatory Visit | Attending: Pulmonary Disease | Admitting: Pulmonary Disease

## 2021-01-31 ENCOUNTER — Other Ambulatory Visit: Payer: Self-pay

## 2021-01-31 DIAGNOSIS — R918 Other nonspecific abnormal finding of lung field: Secondary | ICD-10-CM | POA: Diagnosis not present

## 2021-01-31 DIAGNOSIS — I700 Atherosclerosis of aorta: Secondary | ICD-10-CM | POA: Diagnosis not present

## 2021-01-31 DIAGNOSIS — J439 Emphysema, unspecified: Secondary | ICD-10-CM | POA: Diagnosis not present

## 2021-01-31 DIAGNOSIS — J9811 Atelectasis: Secondary | ICD-10-CM | POA: Diagnosis not present

## 2021-01-31 DIAGNOSIS — R911 Solitary pulmonary nodule: Secondary | ICD-10-CM | POA: Diagnosis not present

## 2021-02-05 ENCOUNTER — Other Ambulatory Visit (HOSPITAL_BASED_OUTPATIENT_CLINIC_OR_DEPARTMENT_OTHER): Payer: Medicare HMO

## 2021-02-06 ENCOUNTER — Ambulatory Visit: Payer: Medicare HMO | Admitting: Acute Care

## 2021-02-06 ENCOUNTER — Other Ambulatory Visit: Payer: Self-pay

## 2021-02-06 ENCOUNTER — Encounter: Payer: Self-pay | Admitting: Acute Care

## 2021-02-06 VITALS — BP 120/62 | HR 99 | Temp 98.3°F | Ht 66.0 in | Wt 121.4 lb

## 2021-02-06 DIAGNOSIS — R918 Other nonspecific abnormal finding of lung field: Secondary | ICD-10-CM

## 2021-02-06 DIAGNOSIS — F17210 Nicotine dependence, cigarettes, uncomplicated: Secondary | ICD-10-CM

## 2021-02-06 DIAGNOSIS — Z720 Tobacco use: Secondary | ICD-10-CM

## 2021-02-06 DIAGNOSIS — R69 Illness, unspecified: Secondary | ICD-10-CM | POA: Diagnosis not present

## 2021-02-06 NOTE — Patient Instructions (Addendum)
It is good to see you today. Your CT Scan shows stable nodules. We will do a 6 month follow up CT chest Work on quitting smoking. Call 1-800 QUIT NOW for free nicotine patches gum or mints.  Call us if you need [place] sooner Please contact office for sooner follow up if symptoms do not improve or worsen or seek emergency care

## 2021-02-06 NOTE — Progress Notes (Signed)
History of Present Illness Gabriella Farrell is a 66 y.o. female current every day smoker with abnormal chest imaging of a  left upper lobe nodule which has  minimally increased in size from 04/05/2019.  She is followed by Dr. Valeta Harms for surveillance of pulmonary nodule. .   02/06/2021 Pt. Presents for follow up. She was last seen by Dr. Valeta Harms  01/18/2021. She had a Super D CT Chest done 01/31/2021 for  She is working on quitting smoking. We discussed the need to quit smoking completely. Her scan shows stable nodules. I spoke with Dr. Valeta Harms and we will continue to follow with  a 6 month follow up CT Chest.  She has no other complaints.   Test Results: 01/31/2021 Super D CT Chest By my measurements, an 8 by 7 by 8 mm left upper lobe nodule is unchanged from 11/18/2019, and minimally increased in size from 04/05/2019. Possibilities include benign process or a small indolent neoplasm. The lesion is at the borderline of minimal size threshold for PET-CT although PET-CT would not be unreasonable. Alternatively, follow up chest CT in 6 months time might be employed. The lesion is not well located for percutaneous biopsy at its current size. Stable 5 by 4 mm ground-glass density nodule in the right lower lobe.  CBC Latest Ref Rng & Units 10/14/2019 10/13/2009 10/12/2009  WBC 4.0 - 10.5 K/uL 4.8 9.5 10.9(H)  Hemoglobin 12.0 - 15.0 g/dL 14.0 12.2 12.0  Hematocrit 36.0 - 46.0 % 39.5 34.3(L) 34.9(L)  Platelets 150 - 400 K/uL 256 293 309    BMP Latest Ref Rng & Units 10/14/2019 02/16/2019 06/10/2018  Glucose 70 - 99 mg/dL 107(H) 104(H) 103(H)  BUN 8 - 23 mg/dL 9 11 9   Creatinine 0.44 - 1.00 mg/dL 0.88 1.06(H) 0.78  BUN/Creat Ratio 12 - 28 - 10(L) 12  Sodium 135 - 145 mmol/L 128(L) 137 134  Potassium 3.5 - 5.1 mmol/L 4.2 4.8 4.3  Chloride 98 - 111 mmol/L 92(L) 98 97  CO2 22 - 32 mmol/L 23 22 22   Calcium 8.9 - 10.3 mg/dL 9.8 9.6 10.2    BNP No results found for: BNP  ProBNP No results found  for: PROBNP  PFT No results found for: FEV1PRE, FEV1POST, FVCPRE, FVCPOST, TLC, DLCOUNC, PREFEV1FVCRT, PSTFEV1FVCRT  CT Super D Chest Wo Contrast  Result Date: 02/01/2021 CLINICAL DATA:  Lung nodule surveillance EXAM: CT CHEST WITHOUT CONTRAST TECHNIQUE: Multidetector CT imaging of the chest was performed using thin slice collimation for electromagnetic bronchoscopy planning purposes, without intravenous contrast. COMPARISON:  11/18/2019 FINDINGS: Cardiovascular: Coronary, aortic arch, and branch vessel atherosclerotic vascular disease. Mediastinum/Nodes: Unremarkable Lungs/Pleura: Centrilobular emphysema. Mild scarring medially in the right middle lobe. Minimal biapical pleuroparenchymal scarring. Bandlike density favoring atelectasis medially in the left upper lobe on image 13 series 4, new compared to the prior exam. Medially in the left upper lobe, a 8 by 7 by 8 mm (volume = 240 mm^3) solid nodule is shown on image 39 series 4. By my measurements this is 8 by 7 by 8 mm (volume = 240 mm^3) on 11/28/2019, 7 by 6 by 7 mm (volume = 160 mm^3) on 04/05/2019, and not included on preceding CT abdominal exams. Faint 5 by 4 mm ground-glass density nodule in the right lower lobe on image 36 series 4 appears stable back through 04/05/2019. No new nodules are identified. Upper Abdomen: Stable pneumobilia. 1.2 by 1.7 cm right adrenal mass with internal density of -15 Hounsfield units compatible with adenoma. Abdominal aortic  atherosclerosis. Musculoskeletal: Postoperative findings in the left proximal humerus. IMPRESSION: 1. By my measurements, an 8 by 7 by 8 mm left upper lobe nodule is unchanged from 11/18/2019, and minimally increased in size from 04/05/2019. Possibilities include benign process or a small indolent neoplasm. The lesion is at the borderline of minimal size threshold for PET-CT although PET-CT would not be unreasonable. Alternatively, follow up chest CT in 6 months time might be employed. The lesion  is not well located for percutaneous biopsy at its current size. 2. Stable 5 by 4 mm ground-glass density nodule in the right lower lobe. 3. Other imaging findings of potential clinical significance: Aortic Atherosclerosis (ICD10-I70.0). Coronary atherosclerosis. Emphysema (ICD10-J43.9). Stable pneumobilia. Right adrenal adenoma. Electronically Signed   By: Van Clines M.D.   On: 02/01/2021 10:48     Past medical hx Past Medical History:  Diagnosis Date   Depression      Social History   Tobacco Use   Smoking status: Every Day    Packs/day: 1.00    Years: 45.00    Pack years: 45.00    Types: Cigarettes    Start date: 1976   Smokeless tobacco: Never   Tobacco comments:    Currently smoking 1ppd a day as of 02/06/21  ep  Vaping Use   Vaping Use: Never used  Substance Use Topics   Alcohol use: Yes    Alcohol/week: 15.0 standard drinks    Types: 1 Glasses of wine, 14 Standard drinks or equivalent per week    Comment: daily   Drug use: No    [name] reports that she has been smoking cigarettes. She started smoking about 47 years ago. She has a 45.00 pack-year smoking history. She has never used smokeless tobacco. She reports current alcohol use of about 15.0 standard drinks per week. She reports that she does not use drugs.  Tobacco Cessation: Current Every Day Smoker  Counseled to quit smoking completely.  Past surgical hx, Family hx, Social hx all reviewed.  Current Outpatient Medications on File Prior to Visit  Medication Sig   ALPRAZolam (XANAX) 0.5 MG tablet Take 0.5 mg by mouth every 6 (six) hours as needed for anxiety.   amLODipine (NORVASC) 5 MG tablet Take 1 tablet by mouth once daily   Armodafinil 250 MG tablet Take 250 mg by mouth daily.   Ascorbic Acid (VITAMIN C) 1000 MG tablet Take 1,000 mg by mouth daily.   atorvastatin (LIPITOR) 20 MG tablet Take 1 tablet by mouth once daily   Cholecalciferol (VITAMIN D) 50 MCG (2000 UT) CAPS Take 2,000 Units by  mouth daily.   desvenlafaxine (PRISTIQ) 100 MG 24 hr tablet Take 100 mg by mouth daily.   Multiple Vitamin (MULTIVITAMIN) capsule Take 1 capsule by mouth daily.   pantoprazole (PROTONIX) 40 MG tablet Take 40 mg by mouth daily.   No current facility-administered medications on file prior to visit.     Allergies  Allergen Reactions   Bee Venom Anaphylaxis    Review Of Systems:  Constitutional:   No  weight loss, night sweats,  Fevers, chills, fatigue, or  lassitude.  HEENT:   No headaches,  Difficulty swallowing,  Tooth/dental problems, or  Sore throat,                No sneezing, itching, ear ache, nasal congestion, post nasal drip,   CV:  No chest pain,  Orthopnea, PND, swelling in lower extremities, anasarca, dizziness, palpitations, syncope.   GI  No heartburn, indigestion,  abdominal pain, nausea, vomiting, diarrhea, change in bowel habits, loss of appetite, bloody stools.   Resp: No shortness of breath with exertion or at rest.  No excess mucus, no productive cough,  No non-productive cough,  No coughing up of blood.  No change in color of mucus.  No wheezing.  No chest wall deformity  Skin: no rash or lesions.  GU: no dysuria, change in color of urine, no urgency or frequency.  No flank pain, no hematuria   MS:  No joint pain or swelling.  No decreased range of motion.  No back pain.  Psych:  No change in mood or affect. No depression or anxiety.  No memory loss.   Vital Signs BP 120/62 (BP Location: Right Arm, Patient Position: Sitting, Cuff Size: Normal)    Pulse 99    Temp 98.3 F (36.8 C) (Oral)    Ht 5\' 6"  (1.676 m)    Wt 121 lb 6.4 oz (55.1 kg)    SpO2 96% Comment: RA   BMI 19.59 kg/m    Physical Exam:  General- No distress,  A&Ox3, pleasant  ENT: No sinus tenderness, TM clear, pale nasal mucosa, no oral exudate,no post nasal drip, no LAN Cardiac: S1, S2, regular rate and rhythm, no murmur Chest: No wheeze/ rales/ dullness; no accessory muscle use, no nasal  flaring, no sternal retractions Abd.: Soft Non-tender, ND, BS +, Body mass index is 19.59 kg/m.  Ext: No clubbing cyanosis, edema Neuro:  normal strength, MAE x 4, A&O x 3 Skin: No rashes, warm and dry, no lesions Psych: normal mood and behavior   Assessment/Plan Stable lung Nodules on CT Imaging in a high risk smoker Plan Your CT Scan shows stable nodules. We will do a 6 month follow up CT chest Work on quitting smoking. Call 1-800 QUIT NOW for free nicotine patches gum or mints.  Call us if you need [place] sooner Please contact office for sooner follow up if symptoms do not improve or worsen or seek emergency care    I spent 30 minutes dedicated to the care of this patient on the date of this encounter to include pre-visit review of records, face-to-face time with the patient discussing conditions above, post visit ordering of testing, clinical documentation with the electronic health record, making appropriate referrals as documented, and communicating necessary information to the patient's healthcare team. /  Magdalen Spatz, NP 02/06/2021  5:46 PM

## 2021-02-15 ENCOUNTER — Other Ambulatory Visit: Payer: Self-pay | Admitting: Acute Care

## 2021-02-15 DIAGNOSIS — R918 Other nonspecific abnormal finding of lung field: Secondary | ICD-10-CM

## 2021-02-21 DIAGNOSIS — L814 Other melanin hyperpigmentation: Secondary | ICD-10-CM | POA: Diagnosis not present

## 2021-02-21 DIAGNOSIS — L930 Discoid lupus erythematosus: Secondary | ICD-10-CM | POA: Diagnosis not present

## 2021-04-07 IMAGING — CT CT CHEST W/O CM
2 of 4 series · 12 of 36 positions shown, 15 images · non-contrast
Comparison: 04/05/2019 chest CT

CLINICAL DATA: 64-year-old female for follow-up of pulmonary
nodules.

EXAM:
CT CHEST WITHOUT CONTRAST
TECHNIQUE: Multidetector CT imaging of the chest was performed following the
standard protocol without IV contrast.

[Series 2: chest 2.00 br40 s3 · axial · 0.55mm/px · z∈[+1343,+1621]mm · 9 of 165 slices shown, 12 images (1 of 2)]
[im 13/165  mediastinal]
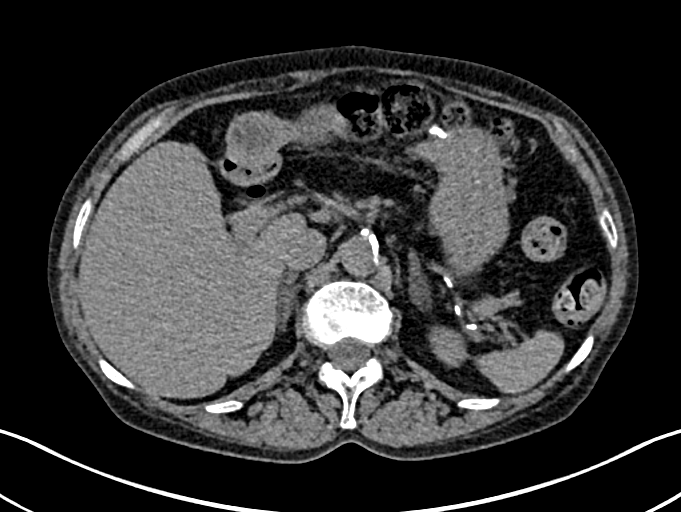
[im 13/165  lung]
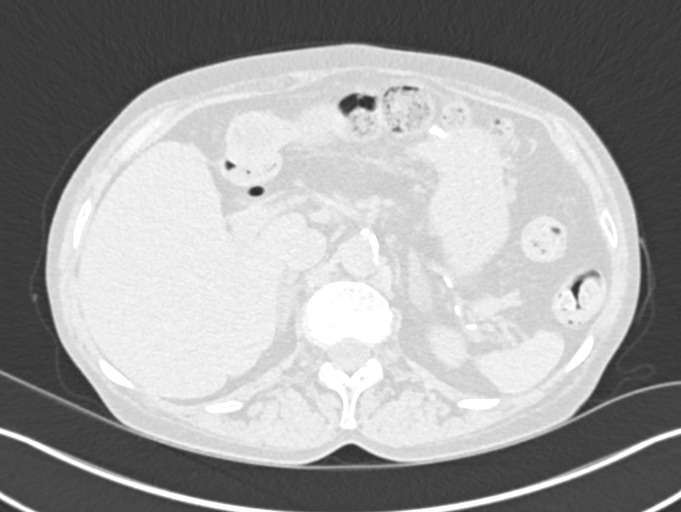
[im 38/165  lung]
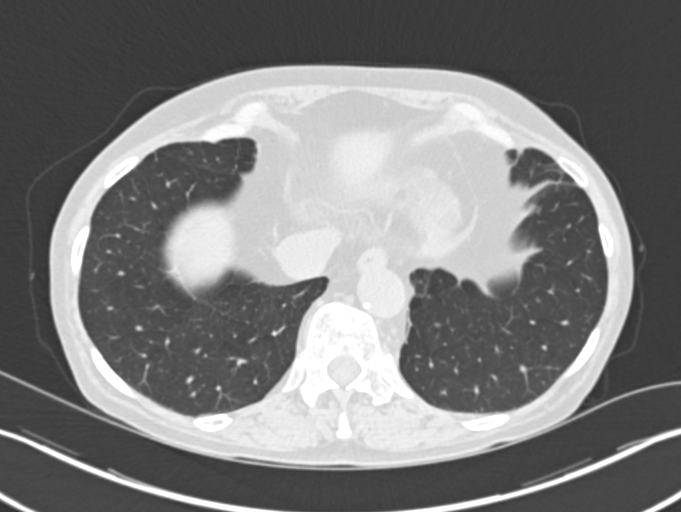
[im 51/165  lung]
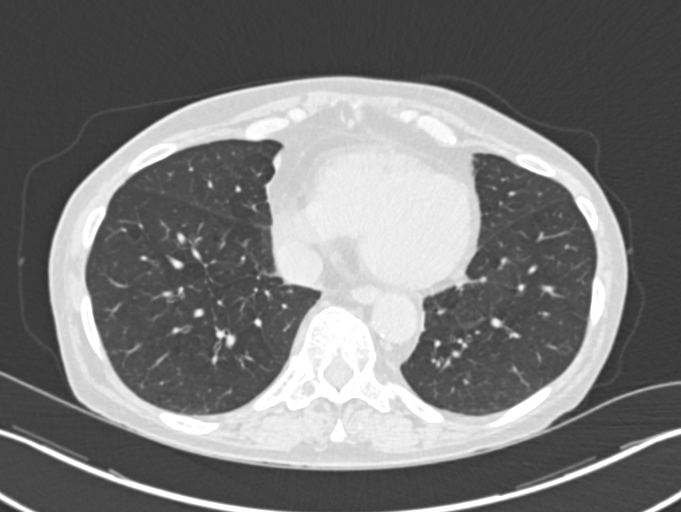
[im 64/165  lung]
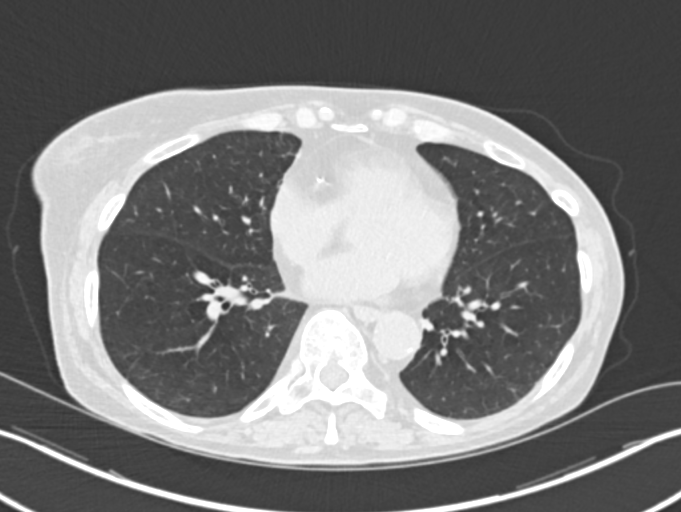
[im 89/165  mediastinal]
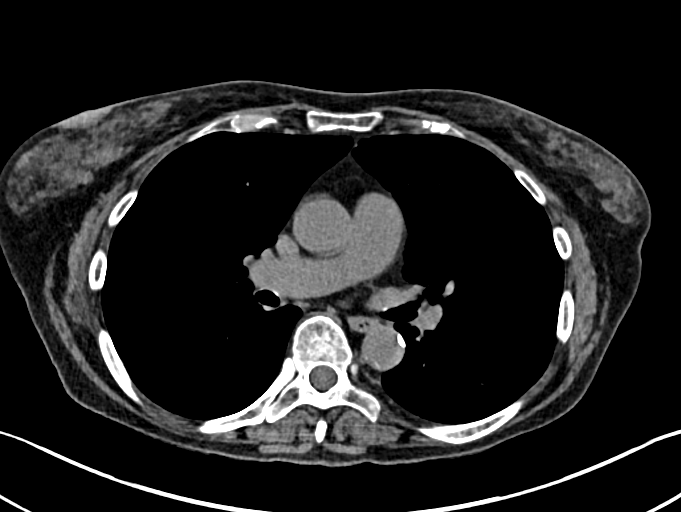
[im 89/165  lung]
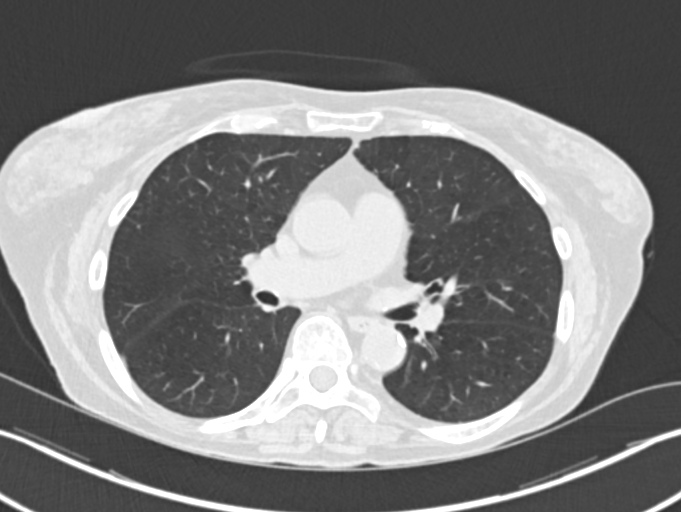
[im 101/165  lung]
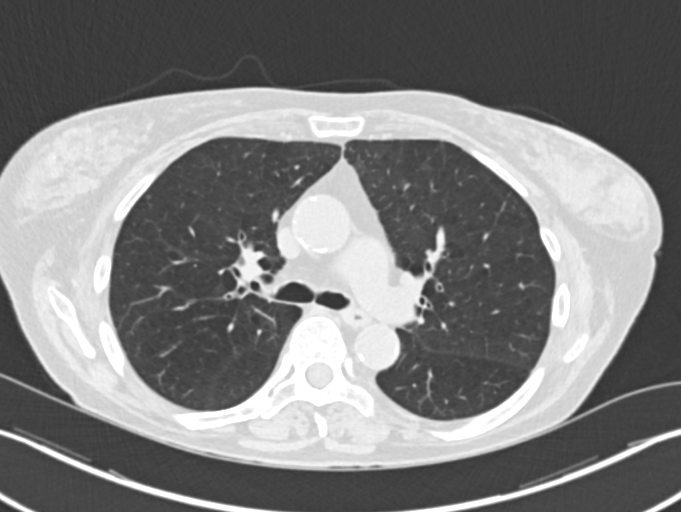
[im 114/165  lung]
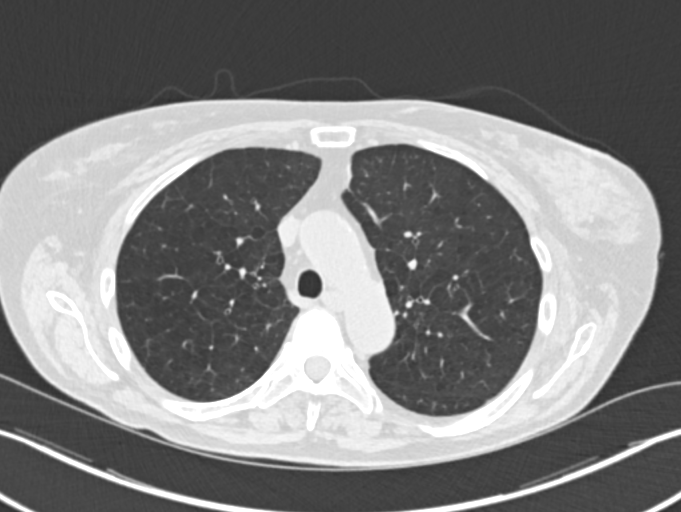
[im 139/165  lung]
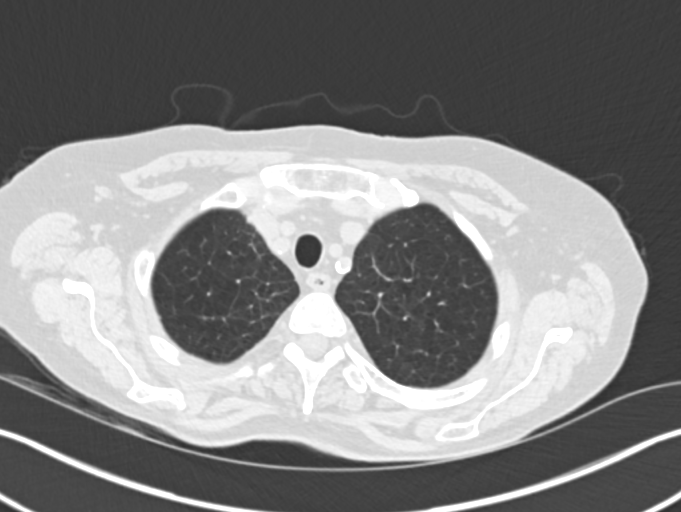
[im 152/165  mediastinal]
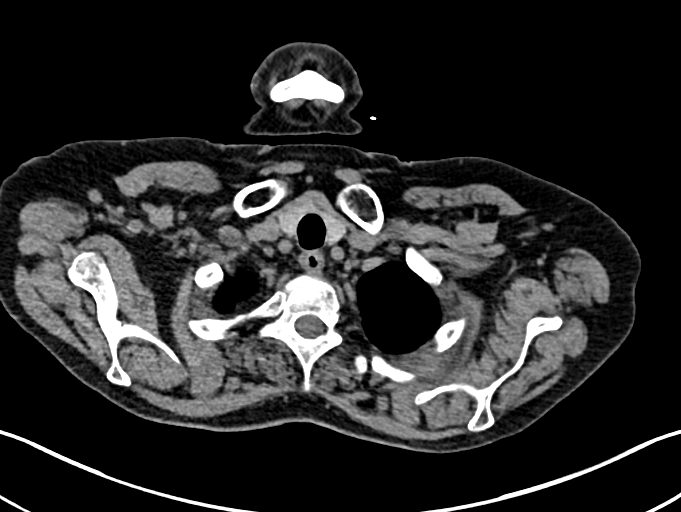
[im 152/165  lung]
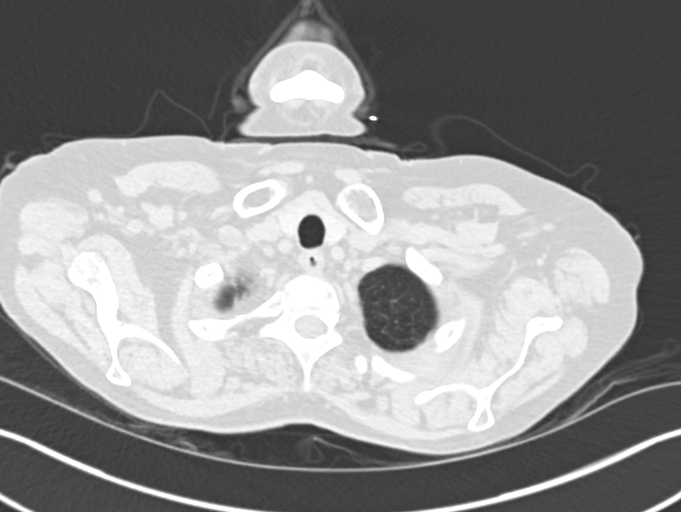

[Series 4: chest 2.00 br40 s3 · coronal · 0.65mm/px · 3 of 138 slices shown (2 of 2)]
[im 28/138  lung]
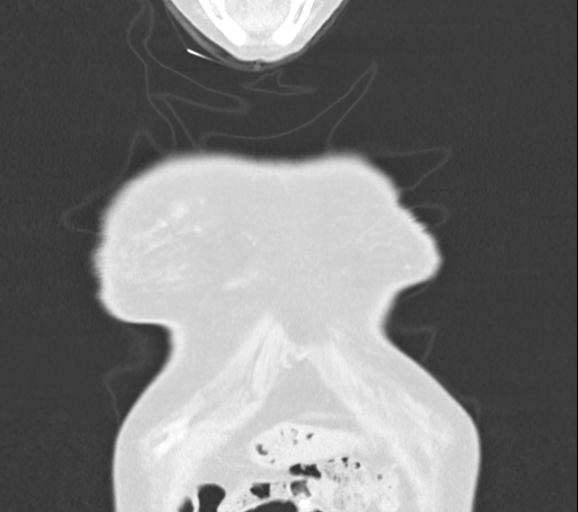
[im 55/138  lung]
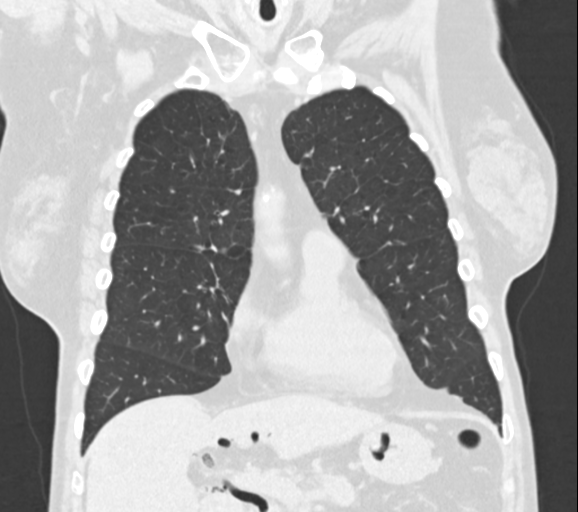
[im 83/138  lung]
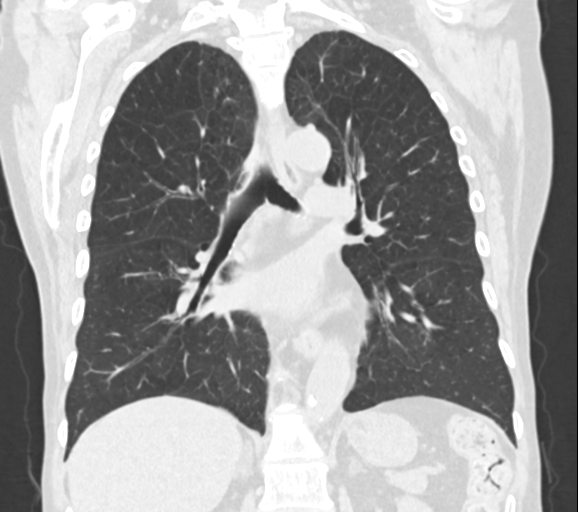

[12 of 36 positions shown; findings below may reference images not displayed]

FINDINGS: Cardiovascular: Heart size is normal. Coronary artery and aortic
atherosclerotic calcifications are again noted. There is no evidence
of thoracic aortic aneurysm or pericardial effusion.

Mediastinum/Nodes: No enlarged mediastinal or axillary lymph nodes.
Thyroid gland, trachea, and esophagus demonstrate no significant
findings.

Lungs/Pleura: A stable 6 mm lingular nodule (series 8: Image 91) and
a stable 5 mm ground-glass RIGHT LOWER lobe nodule ([DATE]) are again
noted.

No new nodules are identified.

No airspace disease, consolidation, mass, pleural effusion or
pneumothorax noted.

Centrilobular emphysema is again noted.

Upper Abdomen: No acute abnormalities are identified. Pneumobilia is
unchanged.

Musculoskeletal: No acute or suspicious bony abnormalities are
identified.
IMPRESSION: 1. No evidence of acute abnormality.
2. Stable pulmonary nodules, largest measuring 6 mm in the lingular
lobe. Recommend noncontrast chest CT follow-up in 12-18 months. This
recommendation follows the consensus statement: Guidelines for
Management of Incidental Pulmonary Nodules Detected on CT Images:
3. Aortic Atherosclerosis (GR2B5-1BZ.Z) and Emphysema (GR2B5-3HW.0).

## 2021-04-16 DIAGNOSIS — E7800 Pure hypercholesterolemia, unspecified: Secondary | ICD-10-CM | POA: Diagnosis not present

## 2021-04-18 DIAGNOSIS — M810 Age-related osteoporosis without current pathological fracture: Secondary | ICD-10-CM | POA: Diagnosis not present

## 2021-04-18 DIAGNOSIS — Z0000 Encounter for general adult medical examination without abnormal findings: Secondary | ICD-10-CM | POA: Diagnosis not present

## 2021-04-18 DIAGNOSIS — E7800 Pure hypercholesterolemia, unspecified: Secondary | ICD-10-CM | POA: Diagnosis not present

## 2021-04-18 DIAGNOSIS — I10 Essential (primary) hypertension: Secondary | ICD-10-CM | POA: Diagnosis not present

## 2021-04-18 DIAGNOSIS — K219 Gastro-esophageal reflux disease without esophagitis: Secondary | ICD-10-CM | POA: Diagnosis not present

## 2021-04-18 DIAGNOSIS — E559 Vitamin D deficiency, unspecified: Secondary | ICD-10-CM | POA: Diagnosis not present

## 2021-04-18 DIAGNOSIS — R69 Illness, unspecified: Secondary | ICD-10-CM | POA: Diagnosis not present

## 2021-04-18 DIAGNOSIS — R911 Solitary pulmonary nodule: Secondary | ICD-10-CM | POA: Diagnosis not present

## 2021-04-24 DIAGNOSIS — L309 Dermatitis, unspecified: Secondary | ICD-10-CM | POA: Diagnosis not present

## 2021-04-24 DIAGNOSIS — L930 Discoid lupus erythematosus: Secondary | ICD-10-CM | POA: Diagnosis not present

## 2021-06-24 DIAGNOSIS — L659 Nonscarring hair loss, unspecified: Secondary | ICD-10-CM | POA: Diagnosis not present

## 2021-06-24 DIAGNOSIS — L309 Dermatitis, unspecified: Secondary | ICD-10-CM | POA: Diagnosis not present

## 2021-08-14 ENCOUNTER — Ambulatory Visit (HOSPITAL_BASED_OUTPATIENT_CLINIC_OR_DEPARTMENT_OTHER): Payer: Medicare HMO

## 2021-08-16 ENCOUNTER — Ambulatory Visit (HOSPITAL_BASED_OUTPATIENT_CLINIC_OR_DEPARTMENT_OTHER)
Admission: RE | Admit: 2021-08-16 | Discharge: 2021-08-16 | Disposition: A | Discharge status: Home / Self Care | Payer: Medicare HMO | Source: Ambulatory Visit | Attending: Acute Care | Admitting: Acute Care

## 2021-08-16 DIAGNOSIS — R918 Other nonspecific abnormal finding of lung field: Secondary | ICD-10-CM | POA: Insufficient documentation

## 2021-08-16 DIAGNOSIS — J439 Emphysema, unspecified: Secondary | ICD-10-CM | POA: Diagnosis not present

## 2021-12-14 DIAGNOSIS — J209 Acute bronchitis, unspecified: Secondary | ICD-10-CM | POA: Diagnosis not present

## 2022-03-14 DIAGNOSIS — D2261 Melanocytic nevi of right upper limb, including shoulder: Secondary | ICD-10-CM | POA: Diagnosis not present

## 2022-03-14 DIAGNOSIS — L930 Discoid lupus erythematosus: Secondary | ICD-10-CM | POA: Diagnosis not present

## 2022-03-14 DIAGNOSIS — D1801 Hemangioma of skin and subcutaneous tissue: Secondary | ICD-10-CM | POA: Diagnosis not present

## 2022-03-14 DIAGNOSIS — L570 Actinic keratosis: Secondary | ICD-10-CM | POA: Diagnosis not present

## 2022-03-14 DIAGNOSIS — D2271 Melanocytic nevi of right lower limb, including hip: Secondary | ICD-10-CM | POA: Diagnosis not present

## 2022-03-14 DIAGNOSIS — L821 Other seborrheic keratosis: Secondary | ICD-10-CM | POA: Diagnosis not present

## 2022-03-14 DIAGNOSIS — D225 Melanocytic nevi of trunk: Secondary | ICD-10-CM | POA: Diagnosis not present

## 2022-04-16 DIAGNOSIS — E7800 Pure hypercholesterolemia, unspecified: Secondary | ICD-10-CM | POA: Diagnosis not present

## 2022-04-16 DIAGNOSIS — E559 Vitamin D deficiency, unspecified: Secondary | ICD-10-CM | POA: Diagnosis not present

## 2022-04-16 DIAGNOSIS — K861 Other chronic pancreatitis: Secondary | ICD-10-CM | POA: Diagnosis not present

## 2022-04-23 DIAGNOSIS — E7800 Pure hypercholesterolemia, unspecified: Secondary | ICD-10-CM | POA: Diagnosis not present

## 2022-04-23 DIAGNOSIS — Z0000 Encounter for general adult medical examination without abnormal findings: Secondary | ICD-10-CM | POA: Diagnosis not present

## 2022-04-23 DIAGNOSIS — R053 Chronic cough: Secondary | ICD-10-CM | POA: Diagnosis not present

## 2022-04-23 DIAGNOSIS — R739 Hyperglycemia, unspecified: Secondary | ICD-10-CM | POA: Diagnosis not present

## 2022-04-23 DIAGNOSIS — R911 Solitary pulmonary nodule: Secondary | ICD-10-CM | POA: Diagnosis not present

## 2022-04-23 DIAGNOSIS — I700 Atherosclerosis of aorta: Secondary | ICD-10-CM | POA: Diagnosis not present

## 2022-04-23 DIAGNOSIS — R69 Illness, unspecified: Secondary | ICD-10-CM | POA: Diagnosis not present

## 2022-04-23 DIAGNOSIS — F411 Generalized anxiety disorder: Secondary | ICD-10-CM | POA: Diagnosis not present

## 2022-04-23 DIAGNOSIS — J432 Centrilobular emphysema: Secondary | ICD-10-CM | POA: Diagnosis not present

## 2022-04-23 DIAGNOSIS — I10 Essential (primary) hypertension: Secondary | ICD-10-CM | POA: Diagnosis not present

## 2022-04-23 DIAGNOSIS — Z23 Encounter for immunization: Secondary | ICD-10-CM | POA: Diagnosis not present

## 2022-04-23 DIAGNOSIS — E871 Hypo-osmolality and hyponatremia: Secondary | ICD-10-CM | POA: Diagnosis not present

## 2022-04-24 ENCOUNTER — Other Ambulatory Visit: Payer: Self-pay | Admitting: Family Medicine

## 2022-04-24 ENCOUNTER — Ambulatory Visit
Admission: RE | Admit: 2022-04-24 | Discharge: 2022-04-24 | Disposition: A | Discharge status: Home / Self Care | Payer: Medicare HMO | Source: Ambulatory Visit | Attending: Family Medicine | Admitting: Family Medicine

## 2022-04-24 DIAGNOSIS — R079 Chest pain, unspecified: Secondary | ICD-10-CM | POA: Diagnosis not present

## 2022-04-24 DIAGNOSIS — R059 Cough, unspecified: Secondary | ICD-10-CM | POA: Diagnosis not present

## 2022-04-24 DIAGNOSIS — R0602 Shortness of breath: Secondary | ICD-10-CM | POA: Diagnosis not present

## 2022-05-05 DIAGNOSIS — E871 Hypo-osmolality and hyponatremia: Secondary | ICD-10-CM | POA: Diagnosis not present

## 2022-06-02 DIAGNOSIS — E871 Hypo-osmolality and hyponatremia: Secondary | ICD-10-CM | POA: Diagnosis not present

## 2022-06-04 DIAGNOSIS — E222 Syndrome of inappropriate secretion of antidiuretic hormone: Secondary | ICD-10-CM | POA: Diagnosis not present

## 2022-06-04 DIAGNOSIS — I10 Essential (primary) hypertension: Secondary | ICD-10-CM | POA: Diagnosis not present

## 2022-06-04 DIAGNOSIS — E871 Hypo-osmolality and hyponatremia: Secondary | ICD-10-CM | POA: Diagnosis not present

## 2022-06-19 DIAGNOSIS — K219 Gastro-esophageal reflux disease without esophagitis: Secondary | ICD-10-CM | POA: Diagnosis not present

## 2022-06-19 DIAGNOSIS — I10 Essential (primary) hypertension: Secondary | ICD-10-CM | POA: Diagnosis not present

## 2022-06-19 DIAGNOSIS — R1319 Other dysphagia: Secondary | ICD-10-CM | POA: Diagnosis not present

## 2022-06-19 DIAGNOSIS — Z86010 Personal history of colonic polyps: Secondary | ICD-10-CM | POA: Diagnosis not present

## 2022-09-16 DIAGNOSIS — E871 Hypo-osmolality and hyponatremia: Secondary | ICD-10-CM | POA: Diagnosis not present

## 2022-09-18 DIAGNOSIS — E871 Hypo-osmolality and hyponatremia: Secondary | ICD-10-CM | POA: Diagnosis not present

## 2022-09-18 DIAGNOSIS — E222 Syndrome of inappropriate secretion of antidiuretic hormone: Secondary | ICD-10-CM | POA: Diagnosis not present

## 2022-09-18 DIAGNOSIS — I10 Essential (primary) hypertension: Secondary | ICD-10-CM | POA: Diagnosis not present

## 2022-09-18 DIAGNOSIS — D2271 Melanocytic nevi of right lower limb, including hip: Secondary | ICD-10-CM | POA: Diagnosis not present

## 2022-09-18 DIAGNOSIS — D034 Melanoma in situ of scalp and neck: Secondary | ICD-10-CM | POA: Diagnosis not present

## 2022-09-18 DIAGNOSIS — L819 Disorder of pigmentation, unspecified: Secondary | ICD-10-CM | POA: Diagnosis not present

## 2022-09-18 DIAGNOSIS — L930 Discoid lupus erythematosus: Secondary | ICD-10-CM | POA: Diagnosis not present

## 2022-10-16 DIAGNOSIS — D034 Melanoma in situ of scalp and neck: Secondary | ICD-10-CM | POA: Diagnosis not present

## 2022-10-24 DIAGNOSIS — E871 Hypo-osmolality and hyponatremia: Secondary | ICD-10-CM | POA: Diagnosis not present

## 2022-11-14 DIAGNOSIS — E871 Hypo-osmolality and hyponatremia: Secondary | ICD-10-CM | POA: Diagnosis not present

## 2022-12-15 DIAGNOSIS — E871 Hypo-osmolality and hyponatremia: Secondary | ICD-10-CM | POA: Diagnosis not present

## 2022-12-25 DIAGNOSIS — R634 Abnormal weight loss: Secondary | ICD-10-CM | POA: Diagnosis not present

## 2022-12-25 DIAGNOSIS — K591 Functional diarrhea: Secondary | ICD-10-CM | POA: Diagnosis not present

## 2022-12-25 DIAGNOSIS — K860 Alcohol-induced chronic pancreatitis: Secondary | ICD-10-CM | POA: Diagnosis not present

## 2022-12-26 DIAGNOSIS — K591 Functional diarrhea: Secondary | ICD-10-CM | POA: Diagnosis not present

## 2022-12-29 ENCOUNTER — Ambulatory Visit
Admission: RE | Admit: 2022-12-29 | Discharge: 2022-12-29 | Disposition: A | Discharge status: Home / Self Care | Payer: Medicare HMO | Source: Ambulatory Visit | Attending: Family Medicine | Admitting: Family Medicine

## 2022-12-29 ENCOUNTER — Other Ambulatory Visit: Payer: Self-pay | Admitting: Family Medicine

## 2022-12-29 DIAGNOSIS — R053 Chronic cough: Secondary | ICD-10-CM

## 2022-12-29 DIAGNOSIS — R911 Solitary pulmonary nodule: Secondary | ICD-10-CM | POA: Diagnosis not present

## 2022-12-29 DIAGNOSIS — F17200 Nicotine dependence, unspecified, uncomplicated: Secondary | ICD-10-CM | POA: Diagnosis not present

## 2022-12-29 DIAGNOSIS — J432 Centrilobular emphysema: Secondary | ICD-10-CM | POA: Diagnosis not present

## 2023-01-16 DIAGNOSIS — E871 Hypo-osmolality and hyponatremia: Secondary | ICD-10-CM | POA: Diagnosis not present

## 2023-01-21 DIAGNOSIS — R911 Solitary pulmonary nodule: Secondary | ICD-10-CM | POA: Diagnosis not present

## 2023-01-21 DIAGNOSIS — E222 Syndrome of inappropriate secretion of antidiuretic hormone: Secondary | ICD-10-CM | POA: Diagnosis not present

## 2023-01-21 DIAGNOSIS — I10 Essential (primary) hypertension: Secondary | ICD-10-CM | POA: Diagnosis not present

## 2023-01-21 DIAGNOSIS — E871 Hypo-osmolality and hyponatremia: Secondary | ICD-10-CM | POA: Diagnosis not present

## 2023-02-09 ENCOUNTER — Ambulatory Visit: Payer: Medicare HMO | Admitting: Pulmonary Disease

## 2023-02-11 ENCOUNTER — Other Ambulatory Visit (HOSPITAL_BASED_OUTPATIENT_CLINIC_OR_DEPARTMENT_OTHER): Payer: Self-pay | Admitting: Gastroenterology

## 2023-02-11 DIAGNOSIS — K529 Noninfective gastroenteritis and colitis, unspecified: Secondary | ICD-10-CM | POA: Diagnosis not present

## 2023-02-11 DIAGNOSIS — R197 Diarrhea, unspecified: Secondary | ICD-10-CM

## 2023-02-11 DIAGNOSIS — R634 Abnormal weight loss: Secondary | ICD-10-CM

## 2023-02-11 DIAGNOSIS — K8689 Other specified diseases of pancreas: Secondary | ICD-10-CM | POA: Diagnosis not present

## 2023-02-11 DIAGNOSIS — Z8719 Personal history of other diseases of the digestive system: Secondary | ICD-10-CM

## 2023-02-16 DIAGNOSIS — M25512 Pain in left shoulder: Secondary | ICD-10-CM | POA: Diagnosis not present

## 2023-02-16 DIAGNOSIS — M19012 Primary osteoarthritis, left shoulder: Secondary | ICD-10-CM | POA: Diagnosis not present

## 2023-02-18 ENCOUNTER — Ambulatory Visit (HOSPITAL_BASED_OUTPATIENT_CLINIC_OR_DEPARTMENT_OTHER)
Admission: RE | Admit: 2023-02-18 | Discharge: 2023-02-18 | Disposition: A | Discharge status: Home / Self Care | Payer: Medicare HMO | Source: Ambulatory Visit | Attending: Gastroenterology | Admitting: Gastroenterology

## 2023-02-18 ENCOUNTER — Encounter (HOSPITAL_BASED_OUTPATIENT_CLINIC_OR_DEPARTMENT_OTHER): Payer: Self-pay

## 2023-02-18 DIAGNOSIS — R197 Diarrhea, unspecified: Secondary | ICD-10-CM | POA: Insufficient documentation

## 2023-02-18 DIAGNOSIS — D3501 Benign neoplasm of right adrenal gland: Secondary | ICD-10-CM | POA: Diagnosis not present

## 2023-02-18 DIAGNOSIS — Z8719 Personal history of other diseases of the digestive system: Secondary | ICD-10-CM | POA: Insufficient documentation

## 2023-02-18 DIAGNOSIS — R634 Abnormal weight loss: Secondary | ICD-10-CM | POA: Insufficient documentation

## 2023-02-18 DIAGNOSIS — R932 Abnormal findings on diagnostic imaging of liver and biliary tract: Secondary | ICD-10-CM | POA: Diagnosis not present

## 2023-02-18 DIAGNOSIS — K769 Liver disease, unspecified: Secondary | ICD-10-CM | POA: Diagnosis not present

## 2023-02-18 MED ORDER — IOHEXOL 300 MG/ML  SOLN
100.0000 mL | Freq: Once | INTRAMUSCULAR | Status: AC | PRN
  Administered 2023-02-18: 100 mL via INTRAVENOUS

## 2023-02-19 ENCOUNTER — Telehealth: Payer: Self-pay | Admitting: Pulmonary Disease

## 2023-02-19 NOTE — Telephone Encounter (Signed)
Fax received from Dr. Francena Hanly with [facility] to perform a left reverse shoulder arthroplasty on patient under general anesthesia.  Patient needs surgery clearance. Surgery is pending clearance. Patient was seen on 02/06/21. Office protocol is a risk assessment can be sent to surgeon if patient has been seen in 60 days or less.

## 2023-02-19 NOTE — Telephone Encounter (Signed)
Appt with Celine Mans for 04/14/23- will hold in clearance pool

## 2023-02-20 DIAGNOSIS — M25512 Pain in left shoulder: Secondary | ICD-10-CM | POA: Diagnosis not present

## 2023-02-20 DIAGNOSIS — E559 Vitamin D deficiency, unspecified: Secondary | ICD-10-CM | POA: Diagnosis not present

## 2023-02-20 DIAGNOSIS — Z681 Body mass index (BMI) 19 or less, adult: Secondary | ICD-10-CM | POA: Diagnosis not present

## 2023-02-20 DIAGNOSIS — M19012 Primary osteoarthritis, left shoulder: Secondary | ICD-10-CM | POA: Diagnosis not present

## 2023-02-20 DIAGNOSIS — E7800 Pure hypercholesterolemia, unspecified: Secondary | ICD-10-CM | POA: Diagnosis not present

## 2023-02-20 DIAGNOSIS — Z01818 Encounter for other preprocedural examination: Secondary | ICD-10-CM | POA: Diagnosis not present

## 2023-02-20 DIAGNOSIS — E041 Nontoxic single thyroid nodule: Secondary | ICD-10-CM | POA: Diagnosis not present

## 2023-02-27 DIAGNOSIS — E871 Hypo-osmolality and hyponatremia: Secondary | ICD-10-CM | POA: Diagnosis not present

## 2023-03-11 ENCOUNTER — Ambulatory Visit: Payer: Medicare HMO | Admitting: Pulmonary Disease

## 2023-03-16 ENCOUNTER — Ambulatory Visit: Payer: Medicare HMO | Admitting: Pulmonary Disease

## 2023-03-19 DIAGNOSIS — D2261 Melanocytic nevi of right upper limb, including shoulder: Secondary | ICD-10-CM | POA: Diagnosis not present

## 2023-03-19 DIAGNOSIS — D225 Melanocytic nevi of trunk: Secondary | ICD-10-CM | POA: Diagnosis not present

## 2023-03-19 DIAGNOSIS — D2272 Melanocytic nevi of left lower limb, including hip: Secondary | ICD-10-CM | POA: Diagnosis not present

## 2023-03-19 DIAGNOSIS — L570 Actinic keratosis: Secondary | ICD-10-CM | POA: Diagnosis not present

## 2023-03-19 DIAGNOSIS — L930 Discoid lupus erythematosus: Secondary | ICD-10-CM | POA: Diagnosis not present

## 2023-03-19 DIAGNOSIS — Z85820 Personal history of malignant melanoma of skin: Secondary | ICD-10-CM | POA: Diagnosis not present

## 2023-03-31 DIAGNOSIS — E871 Hypo-osmolality and hyponatremia: Secondary | ICD-10-CM | POA: Diagnosis not present

## 2023-04-06 ENCOUNTER — Ambulatory Visit: Payer: Medicare HMO | Admitting: Nurse Practitioner

## 2023-04-06 ENCOUNTER — Encounter: Payer: Self-pay | Admitting: Nurse Practitioner

## 2023-04-06 ENCOUNTER — Telehealth: Payer: Self-pay | Admitting: Nurse Practitioner

## 2023-04-06 ENCOUNTER — Ambulatory Visit (INDEPENDENT_AMBULATORY_CARE_PROVIDER_SITE_OTHER)

## 2023-04-06 VITALS — BP 114/62 | HR 78 | Temp 97.9°F | Ht 66.0 in | Wt 110.0 lb

## 2023-04-06 DIAGNOSIS — Z01818 Encounter for other preprocedural examination: Secondary | ICD-10-CM | POA: Diagnosis not present

## 2023-04-06 DIAGNOSIS — J432 Centrilobular emphysema: Secondary | ICD-10-CM | POA: Diagnosis not present

## 2023-04-06 DIAGNOSIS — Z01811 Encounter for preprocedural respiratory examination: Secondary | ICD-10-CM | POA: Diagnosis not present

## 2023-04-06 DIAGNOSIS — F17200 Nicotine dependence, unspecified, uncomplicated: Secondary | ICD-10-CM | POA: Diagnosis not present

## 2023-04-06 DIAGNOSIS — R918 Other nonspecific abnormal finding of lung field: Secondary | ICD-10-CM

## 2023-04-06 MED ORDER — ALBUTEROL SULFATE HFA 108 (90 BASE) MCG/ACT IN AERS: 2.0000 | INHALATION_SPRAY | Freq: Four times a day (QID) | RESPIRATORY_TRACT | 2 refills | Status: AC | PRN

## 2023-04-06 NOTE — Patient Instructions (Addendum)
 Continue Breztri 2 puffs Twice daily. Brush tongue and rinse mouth afterwards Albuterol inhaler 2 puffs every 6 hours as needed for shortness of breath or wheezing. Notify if symptoms persist despite rescue inhaler/neb use.   I will ask the pharmacy team to run a test claims for your inhaler and see if we can find some alternative, less expensive options   Work on quitting smoking. Use nicotine patches to help  Chest x ray today   Take your albuterol inhaler with you to surgery so you have it on the way home if you need it. Use incentive spirometer 10 times an hour during the recovery period. Up out of bed as soon as possible after surgery   I ordered CT chest for you to have in the next 4 weeks. You do not have to have this before your surgery  You haven't had lung function testing before. We'll plan for this at follow up  Follow up with Dr. Delton Coombes after PFT in 6 weeks to review CT scan. Cancel visit with Dr. Celine Mans. If symptoms do not improve or worsen, please contact office for sooner follow up or seek emergency care.

## 2023-04-06 NOTE — Progress Notes (Unsigned)
 @Patient  ID: Gabriella Farrell, female    DOB: 1955/09/13, 68 y.o.   MRN: [id number]  Chief Complaint  Patient presents with   Follow-up    Needs risk assessment for shoulder replacement. She has had increased cough for approx 2 months- prod with clear sputum.     Referring provider: Jarrett Soho, PA-C  HPI: 68 year old female, active smoker followed for multiple lung nodules and emphysema.  She is a former patient of Dr. Myrlene Broker and last seen in office 02/06/2021.  Past medical history significant for anxiety, depression, hypertension, GERD, history of SIADH, alcohol induced chronic pancreatitis, history of melanoma.  TEST/EVENTS:  01/31/2021 Super D CT chest: 8x7x8 LUL nodule, minimally increased in size from 2021. Stable 5x4 mm ground glass density in RLL.  08/16/2021 CT chest: stable 8x7 mm LUL nodule. 5 mm ground glass opacity in RLL. Emphysema. Atelectasis/scarring in LUL.   02/06/2021: [name] with [name], NP. Followed for surveillance of pulmonary nodule. Hd Super D CT chest 01/31/2021 with stable nodules. Plan to repeat in 6 months.   04/06/2023: Today-surgical risk assessment Patient presents today for surgical risk assessment with her husband prior to left reverse shoulder arthroplasty.  Surgery date pending.  Procedure with Dr. Rennis Chris.  She had bronchitis about a month ago.  Feels like she has recovered since then.  Breathing has been doing well for the most part otherwise.  Does have a daily cough with clear phlegm.  Not noticing any wheezing, chest congestion.  No recent fevers, chills.  Does have a history of multiple pulmonary nodules.  She is an active smoker.  No hemoptysis, weight loss, anorexia.  Has not had repeat CT since 2023.  She is not using her Breztri consistently.  It is very expensive even with insurance.  Does not have an albuterol rescue inhaler.  Allergies  Allergen Reactions   Bee Venom Anaphylaxis    Immunization History  Administered Date(s) Administered    Pneumococcal Polysaccharide-23 01/04/2014   Td 01/04/1999, 10/13/2019   Tdap 01/03/2009    Past Medical History:  Diagnosis Date   Depression     Tobacco History: Social History   Tobacco Use  Smoking Status Every Day   Current packs/day: 1.00   Average packs/day: 1 pack/day for 49.2 years (49.2 ttl pk-yrs)   Types: Cigarettes   Start date: 1976  Smokeless Tobacco Never  Tobacco Comments   Currently smoking 1ppd a day as of 02/06/21  ep   Ready to quit: Not Answered Counseling given: Not Answered Tobacco comments: Currently smoking 1ppd a day as of 02/06/21  ep   Outpatient Medications Prior to Visit  Medication Sig Dispense Refill   ALPRAZolam (XANAX) 0.5 MG tablet Take 0.5 mg by mouth every 6 (six) hours as needed for anxiety.     amLODipine (NORVASC) 5 MG tablet Take 1 tablet by mouth once daily 90 tablet 0   Armodafinil 200 MG TABS Take 1 tablet by mouth daily.     Ascorbic Acid (VITAMIN C) 1000 MG tablet Take 1,000 mg by mouth daily.     atorvastatin (LIPITOR) 20 MG tablet Take 1 tablet by mouth once daily 90 tablet 0   Budeson-Glycopyrrol-Formoterol (BREZTRI AEROSPHERE) 160-9-4.8 MCG/ACT AERO Inhale 2 puffs into the lungs 2 (two) times daily.     Cholecalciferol (VITAMIN D) 50 MCG (2000 UT) CAPS Take 2,000 Units by mouth daily.     desvenlafaxine (PRISTIQ) 25 MG 24 hr tablet Take 25 mg by mouth daily.     Dextromethorphan-buPROPion ER (  AUVELITY) 45-105 MG TBCR Take 1 tablet by mouth daily.     Multiple Vitamin (MULTIVITAMIN) capsule Take 1 capsule by mouth daily.     pantoprazole (PROTONIX) 40 MG tablet Take 40 mg by mouth daily.     sodium chloride 1 g tablet Take 2 g by mouth 2 (two) times daily.     Armodafinil 250 MG tablet Take 250 mg by mouth daily.     desvenlafaxine (PRISTIQ) 100 MG 24 hr tablet Take 100 mg by mouth daily.     No facility-administered medications prior to visit.     Review of Systems:   Constitutional: No weight loss or gain, night sweats,  fevers, chills, fatigue, or lassitude. HEENT: No headaches, difficulty swallowing, tooth/dental problems, or sore throat. No sneezing, itching, ear ache, nasal congestion, or post nasal drip CV:  No chest pain, orthopnea, PND, swelling in lower extremities, anasarca, dizziness, palpitations, syncope Resp: +stable shortness of breath with exertion; chronic cough. No excess mucus or change in color of mucus. No hemoptysis. No wheezing.  No chest wall deformity GI:  No heartburn, indigestion, abdominal pain, nausea, vomiting, diarrhea, change in bowel habits, loss of appetite, bloody stools.  Skin: No rash, lesions, ulcerations MSK:  +shoulder pain  Neuro: No dizziness or lightheadedness.  Psych: No depression or anxiety. Mood stable.     Physical Exam:  BP 114/62   Pulse 78   Temp 97.9 F (36.6 C) (Oral)   Ht 5\' 6"  (1.676 m)   Wt 110 lb (49.9 kg)   SpO2 98%   BMI 17.75 kg/m   GEN: Pleasant, interactive, well-appearing; in no acute distress HEENT:  Normocephalic and atraumatic. PERRLA. Sclera white. Nasal turbinates pink, moist and patent bilaterally. No rhinorrhea present. Oropharynx pink and moist, without exudate or edema. No lesions, ulcerations, or postnasal drip.  NECK:  Supple w/ fair ROM. No JVD present. Normal carotid impulses w/o bruits. Thyroid symmetrical with no goiter or nodules palpated. No lymphadenopathy.   CV: RRR, no m/r/g, no peripheral edema. Pulses intact, +2 bilaterally. No cyanosis, pallor or clubbing. PULMONARY:  Unlabored, regular breathing. Diminished bibasilar airflow otherwise clear bilaterally A&P w/o wheezes/rales/rhonchi. No accessory muscle use.  GI: BS present and normoactive. Soft, non-tender to palpation. No organomegaly or masses detected. MSK: No erythema, warmth or tenderness. Cap refil <2 sec all extrem. No deformities or joint swelling noted.  Neuro: A/Ox3. No focal deficits noted.   Skin: Warm, no lesions or rashe Psych: Normal affect and  behavior. Judgement and thought content appropriate.     Lab Results:  CBC    Component Value Date/Time   WBC 4.8 10/14/2019 1448   RBC 4.15 10/14/2019 1448   HGB 14.0 10/14/2019 1448   HCT 39.5 10/14/2019 1448   PLT 256 10/14/2019 1448   MCV 95.2 10/14/2019 1448   MCH 33.7 10/14/2019 1448   MCHC 35.4 10/14/2019 1448   RDW 12.2 10/14/2019 1448   LYMPHSABS 2.0 10/10/2009 1723   MONOABS 0.8 10/10/2009 1723   EOSABS 0.2 10/10/2009 1723   BASOSABS 0.1 10/10/2009 1723    BMET    Component Value Date/Time   NA 128 (L) 10/14/2019 1448   NA 137 02/16/2019 1127   K 4.2 10/14/2019 1448   CL 92 (L) 10/14/2019 1448   CO2 23 10/14/2019 1448   GLUCOSE 107 (H) 10/14/2019 1448   BUN 9 10/14/2019 1448   BUN 11 02/16/2019 1127   CREATININE 0.88 10/14/2019 1448   CALCIUM 9.8 10/14/2019 1448  GFRNONAA >60 10/14/2019 1448   GFRAA >60 10/14/2019 1448    BNP No results found for: "BNP"   Imaging:  No results found.  Administration History     None           No data to display          No results found for: "NITRICOXIDE"      Assessment & Plan:   Centrilobular emphysema (HCC) Emphysematous changes on prior CT imaging.  Does also have symptoms of chronic bronchitis.  She is never had formal pulmonary function testing.  Presumed to have smoking-related obstructive lung disease.  Will need PFTs at follow-up.  In interim, provided with samples of Breztri so she can resume triple therapy regimen.  Will run to test claims with pharmacy to figure out more affordable options for her.  Educated on importance of compliance with maintenance therapy.  She was also provided with albuterol for rescue/emergency use.  Teach back performed.  Understands the difference tween maintenance and rescue inhalers.  Smoking cessation strongly advised.  Action plan in place.  Patient Instructions  Continue Breztri 2 puffs Twice daily. Brush tongue and rinse mouth afterwards Albuterol  inhaler 2 puffs every 6 hours as needed for shortness of breath or wheezing. Notify if symptoms persist despite rescue inhaler/neb use.   I will ask the pharmacy team to run a test claims for your inhaler and see if we can find some alternative, less expensive options   Work on quitting smoking. Use nicotine patches to help  Chest x ray today   Take your albuterol inhaler with you to surgery so you have it on the way home if you need it. Use incentive spirometer 10 times an hour during the recovery period. Up out of bed as soon as possible after surgery   I ordered CT chest for you to have in the next 4 weeks. You do not have to have this before your surgery  You haven't had lung function testing before. We'll plan for this at follow up  Follow up with Dr. Delton Coombes after PFT in 6 weeks to review CT scan. Cancel visit with Dr. Celine Mans. If symptoms do not improve or worsen, please contact office for sooner follow up or seek emergency care.    Lung nodules Few nodules on previous CT imaging.  There is an 8 x 7 mm nodule in the left upper lobe that was being followed.  Last scan was in 2023.  Updated CT chest ordered today to be completed in the next few weeks.  If nodules are stable, can refer her to lung cancer screening program for yearly monitoring.  Tobacco use disorder Smoking cessation strongly advised  Preoperative respiratory examination Moderate risk. Factors that increase the risk for postoperative pulmonary complications are emphysema, smoker, age. CXR today.  Respiratory complications generally occur in 1% of ASA Class I patients, 5% of ASA Class II and 10% of ASA Class III-IV patients These complications rarely result in mortality and include postoperative pneumonia, atelectasis, pulmonary embolism, ARDS and increased time requiring postoperative mechanical ventilation.   Overall, I recommend proceeding with the surgery if the risk for respiratory complications are outweighed by the  potential benefits. This will need to be discussed between the patient and surgeon.   To reduce risks of respiratory complications, I recommend: --Pre- and post-operative incentive spirometry performed frequently while awake --Inpatient use of currently prescribed bronchodilators --Short duration of surgery as much as possible and avoid paralytic if possible --OOB,  encourage mobility post-op   1) RISK FOR PROLONGED MECHANICAL VENTILAION - > 48h  1A) [place] - Prolonged mech ventilation risk [place] Postperative Pulmonary Risk Score - for mech ventilation dependence >48h USAA, Ann Surg 2000, major non-cardiac surgery) Comment Score  Type of surgery - abd ao aneurysm (27), thoracic (21), neurosurgery / upper abdominal / vascular (21), neck (11) Left shoulder 5  Emergency Surgery - (11)  0  ALbumin < 3 or poor nutritional state - (9)  0  BUN > 30 -  (8)  0  Partial or completely dependent functional status - (7)  0  COPD -  (6)  6  Age - 60 to 69 (4), > 70  (6)  4  TOTAL  16  Risk Stratifcation scores  - < 10 (0.5%), 11-19 (1.8%), 20-27 (4.2%), 28-40 (10.1%), >40 (26.6%)  1.8%     Advised if symptoms do not improve or worsen, to please contact office for sooner follow up or seek emergency care.   I spent 35 minutes of dedicated to the care of this patient on the date of this encounter to include pre-visit review of records, face-to-face time with the patient discussing conditions above, post visit ordering of testing, clinical documentation with the electronic health record, making appropriate referrals as documented, and communicating necessary findings to members of the patients care team.  Noemi Chapel, NP 04/08/2023  Pt aware and understands NP's role.

## 2023-04-06 NOTE — Assessment & Plan Note (Signed)
 Emphysematous changes on prior CT imaging.  Does also have symptoms of chronic bronchitis.  She is never had formal pulmonary function testing.  Presumed to have smoking-related obstructive lung disease.  Will need PFTs at follow-up.  In interim, provided with samples of Breztri so she can resume triple therapy regimen.  Will run to test claims with pharmacy to figure out more affordable options for her.  Educated on importance of compliance with maintenance therapy.  She was also provided with albuterol for rescue/emergency use.  Teach back performed.  Understands the difference tween maintenance and rescue inhalers.  Smoking cessation strongly advised.  Action plan in place.  Patient Instructions  Continue Breztri 2 puffs Twice daily. Brush tongue and rinse mouth afterwards Albuterol inhaler 2 puffs every 6 hours as needed for shortness of breath or wheezing. Notify if symptoms persist despite rescue inhaler/neb use.   I will ask the pharmacy team to run a test claims for your inhaler and see if we can find some alternative, less expensive options   Work on quitting smoking. Use nicotine patches to help  Chest x ray today   Take your albuterol inhaler with you to surgery so you have it on the way home if you need it. Use incentive spirometer 10 times an hour during the recovery period. Up out of bed as soon as possible after surgery   I ordered CT chest for you to have in the next 4 weeks. You do not have to have this before your surgery  You haven't had lung function testing before. We'll plan for this at follow up  Follow up with Dr. Delton Coombes after PFT in 6 weeks to review CT scan. Cancel visit with Dr. Celine Mans. If symptoms do not improve or worsen, please contact office for sooner follow up or seek emergency care.

## 2023-04-06 NOTE — Assessment & Plan Note (Signed)
Smoking cessation strongly advised

## 2023-04-06 NOTE — Assessment & Plan Note (Signed)
 Moderate risk. Factors that increase the risk for postoperative pulmonary complications are emphysema, smoker, age. CXR today.  Respiratory complications generally occur in 1% of ASA Class I patients, 5% of ASA Class II and 10% of ASA Class III-IV patients These complications rarely result in mortality and include postoperative pneumonia, atelectasis, pulmonary embolism, ARDS and increased time requiring postoperative mechanical ventilation.   Overall, I recommend proceeding with the surgery if the risk for respiratory complications are outweighed by the potential benefits. This will need to be discussed between the patient and surgeon.   To reduce risks of respiratory complications, I recommend: --Pre- and post-operative incentive spirometry performed frequently while awake --Inpatient use of currently prescribed bronchodilators --Short duration of surgery as much as possible and avoid paralytic if possible --OOB, encourage mobility post-op   1) RISK FOR PROLONGED MECHANICAL VENTILAION - > 48h  1A) Arozullah - Prolonged mech ventilation risk Arozullah Postperative Pulmonary Risk Score - for mech ventilation dependence >48h USAA, Ann Surg 2000, major non-cardiac surgery) Comment Score  Type of surgery - abd ao aneurysm (27), thoracic (21), neurosurgery / upper abdominal / vascular (21), neck (11) Left shoulder 5  Emergency Surgery - (11)  0  ALbumin < 3 or poor nutritional state - (9)  0  BUN > 30 -  (8)  0  Partial or completely dependent functional status - (7)  0  COPD -  (6)  6  Age - 60 to 69 (4), > 70  (6)  4  TOTAL  16  Risk Stratifcation scores  - < 10 (0.5%), 11-19 (1.8%), 20-27 (4.2%), 28-40 (10.1%), >40 (26.6%)  1.8%

## 2023-04-06 NOTE — Assessment & Plan Note (Signed)
 Few nodules on previous CT imaging.  There is an 8 x 7 mm nodule in the left upper lobe that was being followed.  Recommendation was for 43-month follow-up, which was not completed.  Last scan was in 2023.  Updated CT chest ordered today to be completed in the next few weeks.  If nodules are stable, can refer her to lung cancer screening program for yearly monitoring.

## 2023-04-06 NOTE — Telephone Encounter (Signed)
 Can you please check for most affordable ICS, LABA-LAMA options for this pt?   Thank you!

## 2023-04-07 ENCOUNTER — Telehealth: Payer: Self-pay

## 2023-04-07 ENCOUNTER — Other Ambulatory Visit (HOSPITAL_COMMUNITY): Payer: Self-pay

## 2023-04-07 NOTE — Telephone Encounter (Signed)
 Test claims result in the following:   ICS [place] - non formulary Arnuity Ellipta - requires pa Asmanex HFA - non formulary Fluticasone HFA - non formulary Pulmicort Flexhaler - non formulary Qvar Redihaler - non formulary  LABA+LAMA Incruse - requires pa Spiriva Handihaler - non formulary Spiriva Respimat - non formulary Tudorza - non formulary

## 2023-04-07 NOTE — Telephone Encounter (Signed)
 Pharmacy Patient Advocate Encounter  Asked to test for most affordable ICS, LABA+LAMA for patient. The results are the following:   ICS [place] - non formulary Arnuity Ellipta - requires pa Asmanex HFA - non formulary Fluticasone HFA - non formulary Pulmicort Flexhaler - non formulary Qvar Redihaler - non formulary  LABA+LAMA Incruse - requires pa Spiriva Handihaler - non formulary Spiriva Respimat - non formulary Tudorza - non formulary

## 2023-04-08 ENCOUNTER — Encounter: Payer: Self-pay | Admitting: Nurse Practitioner

## 2023-04-13 ENCOUNTER — Telehealth: Payer: Self-pay | Admitting: Nurse Practitioner

## 2023-04-13 NOTE — Telephone Encounter (Signed)
 Gabriella Farrell, pt's insurance co called to let you know that Incruse is covered  Did you want to prescribe this?  Also the following is from pharm team:   Asked to test for most affordable ICS, LABA+LAMA for patient. The results are the following:    ICS [place] - non formulary Arnuity Ellipta - requires pa Asmanex HFA - non formulary Fluticasone HFA - non formulary Pulmicort Flexhaler - non formulary Qvar Redihaler - non formulary   LABA+LAMA Incruse - requires pa Spiriva Handihaler - non formulary Spiriva Respimat - non formulary Tudorza - non formulary

## 2023-04-13 NOTE — Telephone Encounter (Signed)
 Gabriella Farrell states Incruse Ellipta inhaler is covered by [employer]. Gabriella Farrell phone number is [phone].

## 2023-04-14 ENCOUNTER — Ambulatory Visit: Payer: Medicare HMO | Admitting: Internal Medicine

## 2023-04-14 NOTE — Telephone Encounter (Signed)
 The problem is that she needs to have an ICS as well as the LABA/LAMA (Incruse) to accomplish triple therapy. Is the Arnuity covered and just needs a PA? She's on Breztri now and it's expensive so we are trying to see what options we have to obtain same therapy but more affordable.

## 2023-04-15 ENCOUNTER — Encounter: Payer: Self-pay | Admitting: *Deleted

## 2023-04-15 ENCOUNTER — Telehealth: Payer: Self-pay

## 2023-04-15 ENCOUNTER — Other Ambulatory Visit (HOSPITAL_COMMUNITY): Payer: Self-pay

## 2023-04-15 NOTE — Telephone Encounter (Signed)
 Gabriella Farrell, can you pass this long to our patient and see what she want's to do? I can do Symbicort and Incruse (two separate inhalers), which will total about $120 or she can stay on the [facility] for the $163. It's a deductible problem. Once she hits her deductible, medication costs will go down. Thanks

## 2023-04-15 NOTE — Telephone Encounter (Signed)
 New test bills show the following options- looks like patient also has a deductible to meet which is causing the higher prices:  ICS [place]- $94.33/30 days Asmanex Twisthaler- non formulary Asmanex HFA- non formulary Pulmicort Flexhaler- non formulary Flovent HFA- non formulary Flovent Diskus- non formulary Qvar- non formulary Arnuity- $51.21/30 days LAMA Incruse- $89.16/30 days Spiriva Hanidhaler Generic/Brand- non formulary Spiriva Respimat- non formulary LABA/LAMA Stiolto Respimat- non formulary Anoro Ellipta- $120.24/30 days Bevespi- $109.32/30 days ICS/LABA Generic Advair Diskus/Wixela- $12.00/30 days Advair HFA- non formulary Generic Symbicort- $40.03/30 days Dulera- $117.89//30 days Breo Ellipta- $100.10 ICS/LABA/LAMA Trelegy- $166.57/30 days Breztri- $163.39/30 days

## 2023-04-15 NOTE — Telephone Encounter (Signed)
*  sent to office in original message    ICS [place]- $94.33/30 days Asmanex Twisthaler- non formulary Asmanex HFA- non formulary Pulmicort Flexhaler- non formulary Flovent HFA- non formulary Flovent Diskus- non formulary Qvar- non formulary Arnuity- $51.21/30 days LAMA Incruse- $89.16/30 days Spiriva Hanidhaler Generic/Brand- non formulary Spiriva Respimat- non formulary LABA/LAMA Stiolto Respimat- non formulary Anoro Ellipta- $120.24/30 days Bevespi- $109.32/30 days ICS/LABA Generic Advair Diskus/Wixela- $12.00/30 days Advair HFA- non formulary Generic Symbicort- $40.03/30 days Dulera- $117.89//30 days Breo Ellipta- $100.10 ICS/LABA/LAMA Trelegy- $166.57/30 days Breztri- $163.39/30 days

## 2023-04-15 NOTE — Telephone Encounter (Signed)
 Mychart msg sent to pt  Will close this out and await her response

## 2023-04-16 NOTE — Telephone Encounter (Signed)
 Note fro [facility] 04/06/23 with risk assessment was faxed to [facility] Rockne Menghini [phone].

## 2023-04-17 ENCOUNTER — Telehealth: Payer: Self-pay | Admitting: Pulmonary Disease

## 2023-04-17 MED ORDER — BUDESONIDE-FORMOTEROL FUMARATE 160-4.5 MCG/ACT IN AERO: 2.0000 | INHALATION_SPRAY | Freq: Two times a day (BID) | RESPIRATORY_TRACT | 11 refills | Status: DC

## 2023-04-17 MED ORDER — INCRUSE ELLIPTA 62.5 MCG/ACT IN AEPB: 1.0000 | INHALATION_SPRAY | Freq: Every day | RESPIRATORY_TRACT | 11 refills | Status: DC

## 2023-04-17 NOTE — Telephone Encounter (Signed)
 [employer] calling. Stiolto not covered under PT's ins. Alt are Bevespi and Annoro.   Alona Bene @ [phone]

## 2023-04-17 NOTE — Addendum Note (Signed)
 Addended by: Christen Butter on: 04/17/2023 02:00 PM   Modules accepted: Orders

## 2023-04-17 NOTE — Telephone Encounter (Signed)
 Gabriella Farrell, she said she will take the incruse with symbicort  Please advise on strength of symbicort, thanks!  I pended the incruse

## 2023-04-17 NOTE — Telephone Encounter (Signed)
 Symbicort 160 2 puffs Twice daily

## 2023-04-21 ENCOUNTER — Telehealth: Payer: Self-pay

## 2023-04-21 ENCOUNTER — Other Ambulatory Visit (HOSPITAL_COMMUNITY): Payer: Self-pay

## 2023-04-21 NOTE — Telephone Encounter (Signed)
 Called patient pharmacy- medication is covered patient paid copay of $58.11. [facility] prefers Jerral Ralph which is a branded generic, patient may request Budesonide-formoterol to be ordered for next fill and it may be cheaper.

## 2023-04-21 NOTE — Telephone Encounter (Signed)
*  sent to office in original request  Called patient pharmacy- medication is covered patient paid copay of $58.11. [facility] prefers Gabriella Farrell which is a branded generic, patient may request Budesonide-formoterol to be ordered for next fill and it may be cheaper.

## 2023-04-21 NOTE — Telephone Encounter (Signed)
Please notify pt of above

## 2023-04-21 NOTE — Telephone Encounter (Signed)
 Pt is currently using Breyna inhaler and insurance is unable to cover. Please advise with some alternative formulary options. Routing to pharmacy & Florentina Addison.

## 2023-04-26 NOTE — Patient Instructions (Signed)
 SURGICAL WAITING ROOM VISITATION Patients having surgery or a procedure may have no more than 2 support people in the waiting area - these visitors may rotate in the visitor waiting room.   Due to an increase in RSV and influenza rates and associated hospitalizations, children ages 46 and under may not visit patients in [facility] hospitals. If the patient needs to stay at the hospital during part of their recovery, the visitor guidelines for inpatient rooms apply.  PRE-OP VISITATION  Pre-op nurse will coordinate an appropriate time for 1 support person to accompany the patient in pre-op.  This support person may not rotate.  This visitor will be contacted when the time is appropriate for the visitor to come back in the pre-op area.  Please refer to the [facility] website for the visitor guidelines for Inpatients (after your surgery is over and you are in a regular room).  You are not required to quarantine at this time prior to your surgery. However, you must do this: Hand Hygiene often Do NOT share personal items Notify your provider if you are in close contact with someone who has COVID or you develop fever 100.4 or greater, new onset of sneezing, cough, sore throat, shortness of breath or body aches.  If you test positive for Covid or have been in contact with anyone that has tested positive in the last 10 days please notify you surgeon.    Your procedure is scheduled on:  Thursday  April 30, 2023  Report to [facility] Main Entrance: [facility] entrance where the [employer] is available.   Report to admitting at:  07:30 AM  Call this number if you have any questions or problems the morning of surgery [phone]  Do not eat food after Midnight the night prior to your surgery/procedure.  After Midnight you may have the following liquids until  07:00 AM DAY OF SURGERY  Clear Liquid Diet Water Black Coffee (sugar ok, NO MILK/CREAM OR CREAMERS)  Tea (sugar ok, NO  MILK/CREAM OR CREAMERS) regular and decaf                             Plain Jell-O  with no fruit (NO RED)                                           Fruit ices (not with fruit pulp, NO RED)                                     Popsicles (NO RED)                                                                  Juice: NO CITRUS JUICES: only apple, WHITE grape, WHITE cranberry Sports drinks like Gatorade or Powerade (NO RED)                  The day of surgery:  Drink ONE (1) Pre-Surgery Clear Ensure at 07:00 AM the morning of surgery. Drink in one sitting. Do  not sip.  This drink was given to you during your hospital pre-op appointment visit. Nothing else to drink after completing the Pre-Surgery Clear Ensure : No candy, chewing gum or throat lozenges.    FOLLOW ANY ADDITIONAL PRE OP INSTRUCTIONS YOU RECEIVED FROM YOUR SURGEON'S OFFICE!!!   Oral Hygiene is also important to reduce your risk of infection.        Remember - BRUSH YOUR TEETH THE MORNING OF SURGERY WITH YOUR REGULAR TOOTHPASTE  Do NOT smoke after Midnight the night before surgery.  STOP TAKING all Vitamins, Herbs and supplements 1 week before your surgery.   Take ONLY these medicines the morning of surgery with A SIP OF WATER: pantoprazole, amlodipine, gabapentin, Desvenlafaxine (Pristiq), You may take Famotidine and Alprazolam if needed. You may use your inhalers and please bring your Albuterol inhaler with you on the surgery day.                    You may not have any metal on your body including hair pins, jewelry, and body piercing  Do not wear make-up, lotions, powders, perfumes or deodorant  Do not wear nail polish including gel and S&S, artificial / acrylic nails, or any other type of covering on natural nails including finger and toenails. If you have artificial nails, gel coating, etc., that needs to be removed by a nail salon, Please have this removed prior to surgery. Not doing so may mean that your surgery could  be cancelled or delayed if the Surgeon or anesthesia staff feels like they are unable to monitor you safely.   Do not shave 48 hours prior to surgery to avoid nicks in your skin which may contribute to postoperative infections.   Contacts, Hearing Aids, dentures or bridgework may not be worn into surgery. DENTURES WILL BE REMOVED PRIOR TO SURGERY PLEASE DO NOT APPLY "Poly grip" OR ADHESIVES!!!  Patients discharged on the day of surgery will not be allowed to drive home.  Someone NEEDS to stay with you for the first 24 hours after anesthesia.  Do not bring your home medications to the hospital. The Pharmacy will dispense medications listed on your medication list to you during your admission in the Hospital.  Special Instructions: Bring a copy of your healthcare power of attorney and living will documents the day of surgery, if you wish to have them scanned into your [facility]- EPIC  Please read over the following fact sheets you were given: IF YOU HAVE QUESTIONS ABOUT YOUR PRE-OP INSTRUCTIONS, PLEASE CALL [phone].     Pre-operative 5 CHG Bath Instructions   You can play a key role in reducing the risk of infection after surgery. Your skin needs to be as free of germs as possible. You can reduce the number of germs on your skin by washing with CHG (chlorhexidine gluconate) soap before surgery. CHG is an antiseptic soap that kills germs and continues to kill germs even after washing.   DO NOT use if you have an allergy to chlorhexidine/CHG or antibacterial soaps. If your skin becomes reddened or irritated, stop using the CHG and notify one of our RNs at [phone]  Please shower with the CHG soap starting 4 days before surgery using the following schedule: START SHOWERS ON  Sunday  April 26, 2023  Please keep in mind the following:  DO NOT shave, including legs and underarms, starting the day of your first shower.   You may shave your face at any point before/day of surgery.   Place clean sheets on your bed the day you start using CHG soap. Use a clean washcloth (not used since being washed) for each shower. DO NOT sleep with pets once you start using the CHG.   CHG Shower Instructions:  If you choose to wash your hair and private area, wash first with your normal shampoo/soap.  After you use shampoo/soap, rinse your hair and body thoroughly to remove shampoo/soap residue.  Turn the water OFF and apply about 3 tablespoons (45 ml) of CHG soap to a CLEAN washcloth.  Apply CHG soap ONLY FROM YOUR NECK DOWN TO YOUR TOES (washing for 3-5 minutes)  DO NOT use CHG soap on face, private areas, open wounds, or sores.  Pay special attention to the area where your surgery is being performed.  If you are having back surgery, having someone wash your back for you may be helpful.  Wait 2 minutes after CHG soap is applied, then you may rinse off the CHG soap.  Pat dry with a clean towel  Put on clean clothes/pajamas   If you choose to wear lotion, please use ONLY the CHG-compatible lotions on the back of this paper.     Additional instructions for the day of surgery: DO NOT APPLY any lotions, deodorants, cologne, or perfumes.   Put on clean/comfortable clothes.  Brush your teeth.  Ask your nurse before applying any prescription medications to the skin.      CHG Compatible Lotions   Aveeno Moisturizing lotion  Cetaphil Moisturizing Cream  Cetaphil Moisturizing Lotion  Clairol Herbal Essence Moisturizing Lotion, Dry Skin  Clairol Herbal Essence Moisturizing Lotion, Extra Dry Skin  Clairol Herbal Essence Moisturizing Lotion, Normal Skin  Curel Age Defying Therapeutic Moisturizing Lotion with Alpha Hydroxy  Curel Extreme Care Body Lotion  Curel Soothing Hands Moisturizing Hand  Lotion  Curel Therapeutic Moisturizing Cream, Fragrance-Free  Curel Therapeutic Moisturizing Lotion, Fragrance-Free  Curel Therapeutic Moisturizing Lotion, Original Formula  Eucerin Daily Replenishing Lotion  Eucerin Dry Skin Therapy Plus Alpha Hydroxy Crme  Eucerin Dry Skin Therapy Plus Alpha Hydroxy Lotion  Eucerin Original Crme  Eucerin Original Lotion  Eucerin Plus Crme Eucerin Plus Lotion  Eucerin TriLipid Replenishing Lotion  Keri Anti-Bacterial Hand Lotion  Keri Deep Conditioning Original Lotion Dry Skin Formula Softly Scented  Keri Deep Conditioning Original Lotion, Fragrance Free Sensitive Skin Formula  Keri Lotion Fast Absorbing Fragrance Free Sensitive Skin Formula  Keri Lotion Fast Absorbing Softly Scented Dry Skin Formula  Keri Original Lotion  Keri Skin Renewal Lotion Keri Silky Smooth Lotion  Keri Silky Smooth Sensitive Skin Lotion  Nivea Body Creamy Conditioning Oil  Nivea Body Extra Enriched Lotion  Nivea Body Original Lotion  Nivea Body Sheer Moisturizing Lotion Nivea Crme  Nivea Skin Firming Lotion  NutraDerm 30 Skin Lotion  NutraDerm Skin Lotion  NutraDerm Therapeutic Skin Cream  NutraDerm Therapeutic Skin Lotion  ProShield Protective Hand Cream  Provon moisturizing lotion         Preparing for Total Shoulder Arthroplasty ================================================================= Please follow these instructions carefully, in addition to any other special Bathing information that was explained to you at the Presurgical Appointment:  BENZOYL PEROXIDE 5% GEL: Used to kill bacteria on the skin which could cause an infection at the surgery site.   Please do not use if  you have an allergy to benzoyl peroxide. If your skin becomes reddened/irritated stop using the benzoyl peroxide and inform your Doctor.   Starting two days before surgery, apply as follows:  1. Apply benzoyl peroxide gel in the morning and at night. Apply after taking a shower.  If you are not taking a shower, clean entire shoulder front, back, and side, along with the armpit with a clean wet washcloth.  2. Place a quarter-sized dollop of the gel on your SHOULDER and rub in thoroughly, making sure to cover the front, back, and side of your shoulder, along with the armpit.   2 Days prior to Surgery  TUESDAY  April 28, 2023 First Application _______ Morning Second Application _______ Night  Day Before Surgery      [facility]  April 29, 2023 First Application______ Morning  On the night before surgery, wash your entire body (except hair, face and private areas) with CHG Soap. THEN, rub in the LAST application of the Benzoyl Peroxide Gel on your shoulder.   3. On the Morning of Surgery wash your BODY AGAIN with CHG Soap (except hair, face and private areas)  4. DO NOT USE THE BENZOYL PEROXIDE GEL ON THE DAY OF YOUR SURGERY      FAILURE TO FOLLOW THESE INSTRUCTIONS MAY RESULT IN THE CANCELLATION OF YOUR SURGERY  PATIENT SIGNATURE_________________________________  NURSE SIGNATURE__________________________________  ________________________________________________________________________           Gabriella Farrell    An incentive spirometer is a tool that can help keep your lungs clear and active. This tool measures how well you are filling your lungs with each breath. Taking long deep breaths may help reverse or decrease the chance of developing breathing (pulmonary) problems (especially infection) following: A long period of time when you are unable to move or be active. BEFORE THE PROCEDURE  If the spirometer includes an indicator to show your best effort, your nurse or respiratory therapist will set it to a desired goal. If possible, sit up straight or lean slightly forward. Try not to slouch. Hold the incentive spirometer in an upright position. INSTRUCTIONS FOR USE  Sit on the edge of your bed if possible, or sit up as far as you can in bed or  on a chair. Hold the incentive spirometer in an upright position. Breathe out normally. Place the mouthpiece in your mouth and seal your lips tightly around it. Breathe in slowly and as deeply as possible, raising the piston or the ball toward the top of the column. Hold your breath for 3-5 seconds or for as long as possible. Allow the piston or ball to fall to the bottom of the column. Remove the mouthpiece from your mouth and breathe out normally. Rest for a few seconds and repeat Steps 1 through 7 at least 10 times every 1-2 hours when you are awake. Take your time and take a few normal breaths between deep breaths. The spirometer may include an indicator to show your best effort. Use the indicator as a goal to work toward during each repetition. After each set of 10 deep breaths, practice coughing to be sure your lungs are clear. If you have an incision (the cut made at the time of surgery), support your incision when coughing by placing a pillow or rolled up towels firmly against it. Once you are able to get out of bed, walk around indoors and cough well. You may stop using the incentive spirometer when instructed by your caregiver.  RISKS AND  COMPLICATIONS Take your time so you do not get dizzy or light-headed. If you are in pain, you may need to take or ask for pain medication before doing incentive spirometry. It is harder to take a deep breath if you are having pain. AFTER USE Rest and breathe slowly and easily. It can be helpful to keep track of a log of your progress. Your caregiver can provide you with a simple table to help with this. If you are using the spirometer at home, follow these instructions: SEEK MEDICAL CARE IF:  You are having difficultly using the spirometer. You have trouble using the spirometer as often as instructed. Your pain medication is not giving enough relief while using the spirometer. You develop fever of 100.5 F (38.1 C) or higher.                                                                                                     SEEK IMMEDIATE MEDICAL CARE IF:  You cough up bloody sputum that had not been present before. You develop fever of 102 F (38.9 C) or greater. You develop worsening pain at or near the incision site. MAKE SURE YOU:  Understand these instructions. Will watch your condition. Will get help right away if you are not doing well or get worse. Document Released: 06/02/2006 Document Revised: 04/14/2011 Document Reviewed: 08/03/2006 [facility] Patient Information 2014 [place], [place].         WHAT IS A BLOOD TRANSFUSION? Blood Transfusion Information  A transfusion is the replacement of blood or some of its parts. Blood is made up of multiple cells which provide different functions. Red blood cells carry oxygen and are used for blood loss replacement. White blood cells fight against infection. Platelets control bleeding. Plasma helps clot blood. Other blood products are available for specialized needs, such as hemophilia or other clotting disorders. BEFORE THE TRANSFUSION  Who gives blood for transfusions?  Healthy volunteers who are fully evaluated to make sure their blood is safe. This is blood bank blood. Transfusion therapy is the safest it has ever been in the practice of medicine. Before blood is taken from a donor, a complete history is taken to make sure that person has no history of diseases nor engages in risky social behavior (examples are intravenous drug use or sexual activity with multiple partners). The donor's travel history is screened to minimize risk of transmitting infections, such as malaria. The donated blood is tested for signs of infectious diseases, such as HIV and hepatitis. The blood is then tested to be sure it is compatible with you in order to minimize the chance of a transfusion reaction. If you or a relative donates blood, this is often done in anticipation of surgery and is not appropriate  for emergency situations. It takes many days to process the donated blood. RISKS AND COMPLICATIONS Although transfusion therapy is very safe and saves many lives, the main dangers of transfusion include:  Getting an infectious disease. Developing a transfusion reaction. This is an allergic reaction to something in the blood you were given. Every precaution is  taken to prevent this. The decision to have a blood transfusion has been considered carefully by your caregiver before blood is given. Blood is not given unless the benefits outweigh the risks. AFTER THE TRANSFUSION Right after receiving a blood transfusion, you will usually feel much better and more energetic. This is especially true if your red blood cells have gotten low (anemic). The transfusion raises the level of the red blood cells which carry oxygen, and this usually causes an energy increase. The nurse administering the transfusion will monitor you carefully for complications. HOME CARE INSTRUCTIONS  No special instructions are needed after a transfusion. You may find your energy is better. Speak with your caregiver about any limitations on activity for underlying diseases you may have. SEEK MEDICAL CARE IF:  Your condition is not improving after your transfusion. You develop redness or irritation at the intravenous (IV) site. SEEK IMMEDIATE MEDICAL CARE IF:  Any of the following symptoms occur over the next 12 hours: Shaking chills. You have a temperature by mouth above 102 F (38.9 C), not controlled by medicine. Chest, back, or muscle pain. People around you feel you are not acting correctly or are confused. Shortness of breath or difficulty breathing. Dizziness and fainting. You get a rash or develop hives. You have a decrease in urine output. Your urine turns a dark color or changes to pink, red, or brown. Any of the following symptoms occur over the next 10 days: You have a temperature by mouth above 102 F (38.9 C),  not controlled by medicine. Shortness of breath. Weakness after normal activity. The white part of the eye turns yellow (jaundice). You have a decrease in the amount of urine or are urinating less often. Your urine turns a dark color or changes to pink, red, or brown. Document Released: 01/18/2000 Document Revised: 04/14/2011 Document Reviewed: 09/06/2007 [facility] Patient Information 2014 [place], [place].  _______________________________________________________________________       If you would like to see a video about joint replacement:   [email or web address]

## 2023-04-26 NOTE — Progress Notes (Signed)
 COVID Vaccine received:  []  No [x]  Yes Date of any COVID positive Test in last 90 days:  None  PCP - Jarrett Soho, PA-C at [facility]  Cardiologist - Yates Decamp, MD  (LOV 2021) Pulmonology- Micheline Maze, NP Clearance in 04-06-23 Epic note and scanned to [employer]  [phone] (Work)  Nephrology-  Dr. Thedore Mins at [facility]  Chest x-ray - 04-06-2023 EKG - 03-01-2019   Will repeat  04-28-2023 Stress Test -  ECHO -  Cardiac Cath -   PCR screen: [x]  Ordered & Completed []   No Order but Needs PROFEND     []   N/A for this surgery  Surgery Plan:  [x]  Ambulatory   []  Outpatient in bed  []  Admit Anesthesia:    [x]  General  []  Spinal  []   Choice []   MAC  Pacemaker / ICD device [x]  No []  Yes   Spinal Cord Stimulator:[x]  No []  Yes       History of Sleep Apnea? []  No [x]  Yes  Study 2020 CPAP used?- [x]  No []  Yes    Does the patient monitor blood sugar?   [x]  N/A   []  No []  Yes  Patient has: [x]  NO Hx DM   []  Pre-DM   []  DM1  []   DM2  Blood Thinner / Instructions:  none Aspirin Instructions:  none  ERAS Protocol Ordered: []  No  [x]  Yes PRE-SURGERY [x]  ENSURE  []  G2  Patient is to be NPO after:     Dental hx: [x]  Dentures: top plate  bottom has implants that can be removed.  []  N/A      []  Bridge or Partial:                   []  Loose or Damaged teeth:   Comments: The patient was given Benzoyl peroxide Gel as ordered. Instruction regarding application starting 2 days prior to surgery was given and patient voiced understanding.   Patient was given the 5 CHG shower / bath instructions for Reverse Shoulder arthroplasty surgery along with 2 bottles of the CHG soap. Patient will start this on: 04-26-2023  All questions were asked and answered, Patient voiced understanding of this process.   Activity level: Patient is able to climb a flight of stairs without difficulty; [x]  No CP   but would have SOB   Patient can perform ADLs without assistance.   Anesthesia review: smoker,  anxiety, Emphysema and pulmonary nodules , GERD,  Hx SIADH, Chronic pancreatitis (ETOH), Discoid Lupus,  Patient denies shortness of breath, fever, cough and chest pain at PAT appointment.  Patient verbalized understanding and agreement to the Pre-Surgical Instructions that were given to them at this PAT appointment. Patient was also educated of the need to review these PAT instructions again prior to her surgery.I reviewed the appropriate phone numbers to call if they have any and questions or concerns.

## 2023-04-28 ENCOUNTER — Encounter (HOSPITAL_COMMUNITY): Payer: Self-pay

## 2023-04-28 ENCOUNTER — Encounter (HOSPITAL_COMMUNITY)
Admission: RE | Admit: 2023-04-28 | Discharge: 2023-04-28 | Disposition: A | Discharge status: Home / Self Care | Source: Ambulatory Visit | Attending: Orthopedic Surgery | Admitting: Orthopedic Surgery

## 2023-04-28 ENCOUNTER — Other Ambulatory Visit: Payer: Self-pay

## 2023-04-28 VITALS — BP 146/80 | HR 90 | Temp 97.7°F | Resp 14 | Ht 66.0 in | Wt 108.0 lb

## 2023-04-28 DIAGNOSIS — Z8719 Personal history of other diseases of the digestive system: Secondary | ICD-10-CM | POA: Insufficient documentation

## 2023-04-28 DIAGNOSIS — J432 Centrilobular emphysema: Secondary | ICD-10-CM | POA: Insufficient documentation

## 2023-04-28 DIAGNOSIS — Z79899 Other long term (current) drug therapy: Secondary | ICD-10-CM | POA: Insufficient documentation

## 2023-04-28 DIAGNOSIS — Z01818 Encounter for other preprocedural examination: Secondary | ICD-10-CM | POA: Diagnosis not present

## 2023-04-28 DIAGNOSIS — R9431 Abnormal electrocardiogram [ECG] [EKG]: Secondary | ICD-10-CM | POA: Diagnosis not present

## 2023-04-28 DIAGNOSIS — Z01810 Encounter for preprocedural cardiovascular examination: Secondary | ICD-10-CM | POA: Diagnosis present

## 2023-04-28 DIAGNOSIS — Z01812 Encounter for preprocedural laboratory examination: Secondary | ICD-10-CM | POA: Diagnosis present

## 2023-04-28 HISTORY — DX: Malignant (primary) neoplasm, unspecified: C80.1

## 2023-04-28 HISTORY — DX: Chronic kidney disease, unspecified: N18.9

## 2023-04-28 HISTORY — DX: Personal history of other diseases of the digestive system: Z87.19

## 2023-04-28 HISTORY — DX: Anxiety disorder, unspecified: F41.9

## 2023-04-28 HISTORY — DX: Gastro-esophageal reflux disease without esophagitis: K21.9

## 2023-04-28 HISTORY — DX: Discoid lupus erythematosus: L93.0

## 2023-04-28 HISTORY — DX: Sleep apnea, unspecified: G47.30

## 2023-04-28 HISTORY — DX: Alcohol abuse, in remission: F10.11

## 2023-04-28 HISTORY — DX: Essential (primary) hypertension: I10

## 2023-04-28 HISTORY — DX: Nicotine dependence, unspecified, uncomplicated: F17.200

## 2023-04-28 HISTORY — DX: Syndrome of inappropriate secretion of antidiuretic hormone: E22.2

## 2023-04-28 HISTORY — DX: Other nonspecific abnormal finding of lung field: R91.8

## 2023-04-28 HISTORY — DX: Chronic obstructive pulmonary disease, unspecified: J44.9

## 2023-04-28 HISTORY — DX: Unspecified osteoarthritis, unspecified site: M19.90

## 2023-04-28 LAB — COMPREHENSIVE METABOLIC PANEL
ALT: 28 U/L (ref 0–44)
AST: 32 U/L (ref 15–41)
Albumin: 4.3 g/dL (ref 3.5–5.0)
Alkaline Phosphatase: 78 U/L (ref 38–126)
Anion gap: 10 (ref 5–15)
BUN: 10 mg/dL (ref 8–23)
CO2: 25 mmol/L (ref 22–32)
Calcium: 9.3 mg/dL (ref 8.9–10.3)
Chloride: 96 mmol/L — ABNORMAL LOW (ref 98–111)
Creatinine, Ser: 0.59 mg/dL (ref 0.44–1.00)
GFR, Estimated: >60 mL/min (ref >60)
Glucose, Bld: 97 mg/dL (ref 70–99)
Potassium: 3.9 mmol/L (ref 3.5–5.1)
Sodium: 131 mmol/L — ABNORMAL LOW (ref 135–145)
Total Bilirubin: 0.5 mg/dL (ref 0.0–1.2)
Total Protein: 7.8 g/dL (ref 6.5–8.1)

## 2023-04-28 LAB — CBC
HCT: 38.1 % (ref 36.0–46.0)
Hemoglobin: 13.3 g/dL (ref 12.0–15.0)
MCH: 35.3 pg — ABNORMAL HIGH (ref 26.0–34.0)
MCHC: 34.9 g/dL (ref 30.0–36.0)
MCV: 101.1 fL — ABNORMAL HIGH (ref 80.0–100.0)
Platelets: 227 10*3/uL (ref 150–400)
RBC: 3.77 MIL/uL — ABNORMAL LOW (ref 3.87–5.11)
RDW: 11.4 % — ABNORMAL LOW (ref 11.5–15.5)
WBC: 6.1 10*3/uL (ref 4.0–10.5)
nRBC: 0 % (ref 0.0–0.2)

## 2023-04-28 LAB — SURGICAL PCR SCREEN
MRSA, PCR: NEGATIVE
Staphylococcus aureus: NEGATIVE

## 2023-04-30 ENCOUNTER — Other Ambulatory Visit: Payer: Self-pay

## 2023-04-30 ENCOUNTER — Ambulatory Visit (HOSPITAL_BASED_OUTPATIENT_CLINIC_OR_DEPARTMENT_OTHER): Admitting: Anesthesiology

## 2023-04-30 ENCOUNTER — Ambulatory Visit (HOSPITAL_COMMUNITY): Admitting: Anesthesiology

## 2023-04-30 ENCOUNTER — Ambulatory Visit (HOSPITAL_COMMUNITY)

## 2023-04-30 ENCOUNTER — Encounter (HOSPITAL_COMMUNITY): Payer: Self-pay | Admitting: Orthopedic Surgery

## 2023-04-30 ENCOUNTER — Encounter (HOSPITAL_COMMUNITY)
Admission: RE | Disposition: A | Discharge status: Home / Self Care | Payer: Self-pay | Source: Home / Self Care | Attending: Orthopedic Surgery

## 2023-04-30 ENCOUNTER — Ambulatory Visit (HOSPITAL_COMMUNITY)
Admission: RE | Admit: 2023-04-30 | Discharge: 2023-05-01 | Disposition: A | Discharge status: Home / Self Care | Attending: Orthopedic Surgery | Admitting: Orthopedic Surgery

## 2023-04-30 DIAGNOSIS — I10 Essential (primary) hypertension: Secondary | ICD-10-CM | POA: Diagnosis not present

## 2023-04-30 DIAGNOSIS — J449 Chronic obstructive pulmonary disease, unspecified: Secondary | ICD-10-CM | POA: Diagnosis not present

## 2023-04-30 DIAGNOSIS — F32A Depression, unspecified: Secondary | ICD-10-CM | POA: Diagnosis not present

## 2023-04-30 DIAGNOSIS — L930 Discoid lupus erythematosus: Secondary | ICD-10-CM | POA: Insufficient documentation

## 2023-04-30 DIAGNOSIS — X58XXXS Exposure to other specified factors, sequela: Secondary | ICD-10-CM | POA: Diagnosis not present

## 2023-04-30 DIAGNOSIS — M19112 Post-traumatic osteoarthritis, left shoulder: Secondary | ICD-10-CM | POA: Insufficient documentation

## 2023-04-30 DIAGNOSIS — Z96612 Presence of left artificial shoulder joint: Secondary | ICD-10-CM

## 2023-04-30 DIAGNOSIS — E222 Syndrome of inappropriate secretion of antidiuretic hormone: Secondary | ICD-10-CM | POA: Diagnosis not present

## 2023-04-30 DIAGNOSIS — Z7951 Long term (current) use of inhaled steroids: Secondary | ICD-10-CM | POA: Diagnosis not present

## 2023-04-30 DIAGNOSIS — Z9981 Dependence on supplemental oxygen: Secondary | ICD-10-CM | POA: Diagnosis not present

## 2023-04-30 DIAGNOSIS — M19111 Post-traumatic osteoarthritis, right shoulder: Secondary | ICD-10-CM | POA: Diagnosis not present

## 2023-04-30 DIAGNOSIS — N189 Chronic kidney disease, unspecified: Secondary | ICD-10-CM | POA: Diagnosis not present

## 2023-04-30 DIAGNOSIS — K219 Gastro-esophageal reflux disease without esophagitis: Secondary | ICD-10-CM | POA: Insufficient documentation

## 2023-04-30 DIAGNOSIS — G4730 Sleep apnea, unspecified: Secondary | ICD-10-CM | POA: Insufficient documentation

## 2023-04-30 DIAGNOSIS — S42202S Unspecified fracture of upper end of left humerus, sequela: Secondary | ICD-10-CM | POA: Insufficient documentation

## 2023-04-30 DIAGNOSIS — T1490XS Injury, unspecified, sequela: Secondary | ICD-10-CM | POA: Insufficient documentation

## 2023-04-30 DIAGNOSIS — F419 Anxiety disorder, unspecified: Secondary | ICD-10-CM | POA: Insufficient documentation

## 2023-04-30 DIAGNOSIS — I129 Hypertensive chronic kidney disease with stage 1 through stage 4 chronic kidney disease, or unspecified chronic kidney disease: Secondary | ICD-10-CM | POA: Insufficient documentation

## 2023-04-30 DIAGNOSIS — G8918 Other acute postprocedural pain: Secondary | ICD-10-CM | POA: Diagnosis not present

## 2023-04-30 DIAGNOSIS — F17210 Nicotine dependence, cigarettes, uncomplicated: Secondary | ICD-10-CM | POA: Diagnosis not present

## 2023-04-30 DIAGNOSIS — Z471 Aftercare following joint replacement surgery: Secondary | ICD-10-CM | POA: Diagnosis not present

## 2023-04-30 DIAGNOSIS — Z472 Encounter for removal of internal fixation device: Secondary | ICD-10-CM | POA: Diagnosis not present

## 2023-04-30 HISTORY — PX: HARDWARE REMOVAL: SHX979

## 2023-04-30 HISTORY — PX: REVERSE SHOULDER ARTHROPLASTY: SHX5054

## 2023-04-30 LAB — TYPE AND SCREEN
ABO/RH(D): O POS
Antibody Screen: NEGATIVE

## 2023-04-30 SURGERY — REMOVAL, HARDWARE
Anesthesia: Regional | Site: Shoulder | Laterality: Left

## 2023-04-30 MED ORDER — IPRATROPIUM-ALBUTEROL 0.5-2.5 (3) MG/3ML IN SOLN
RESPIRATORY_TRACT | Status: AC
  Administered 2023-04-30: 3 mL via RESPIRATORY_TRACT
  Filled 2023-04-30: qty 3

## 2023-04-30 MED ORDER — SODIUM CHLORIDE 1 G PO TABS: 1.0000 g | ORAL_TABLET | ORAL | Status: DC

## 2023-04-30 MED ORDER — ACETAMINOPHEN 325 MG PO TABS
650.0000 mg | ORAL_TABLET | Freq: Four times a day (QID) | ORAL | Status: DC | PRN
  Administered 2023-04-30: 650 mg via ORAL
  Filled 2023-04-30: qty 2

## 2023-04-30 MED ORDER — VENLAFAXINE HCL ER 37.5 MG PO CP24
37.5000 mg | ORAL_CAPSULE | Freq: Every day | ORAL | Status: DC
  Administered 2023-05-01: 37.5 mg via ORAL
  Filled 2023-04-30: qty 1

## 2023-04-30 MED ORDER — PHENYLEPHRINE HCL-NACL 20-0.9 MG/250ML-% IV SOLN
INTRAVENOUS | Status: DC | PRN
  Administered 2023-04-30: 25 ug/min via INTRAVENOUS

## 2023-04-30 MED ORDER — OXYCODONE HCL 5 MG PO TABS: 5.0000 mg | ORAL_TABLET | Freq: Once | ORAL | Status: DC | PRN

## 2023-04-30 MED ORDER — VANCOMYCIN HCL 1000 MG IV SOLR
INTRAVENOUS | Status: AC
  Filled 2023-04-30: qty 20

## 2023-04-30 MED ORDER — TRANEXAMIC ACID-NACL 1000-0.7 MG/100ML-% IV SOLN
1000.0000 mg | INTRAVENOUS | Status: AC
  Administered 2023-04-30: 1000 mg via INTRAVENOUS
  Filled 2023-04-30: qty 100

## 2023-04-30 MED ORDER — PROPOFOL 10 MG/ML IV BOLUS
INTRAVENOUS | Status: AC
  Filled 2023-04-30: qty 20

## 2023-04-30 MED ORDER — 0.9 % SODIUM CHLORIDE (POUR BTL) OPTIME
TOPICAL | Status: DC | PRN
  Administered 2023-04-30: 1000 mL

## 2023-04-30 MED ORDER — ORAL CARE MOUTH RINSE: 15.0000 mL | Freq: Once | OROMUCOSAL | Status: AC

## 2023-04-30 MED ORDER — UMECLIDINIUM BROMIDE 62.5 MCG/ACT IN AEPB
1.0000 | INHALATION_SPRAY | Freq: Every day | RESPIRATORY_TRACT | Status: DC
  Administered 2023-05-01: 1 via RESPIRATORY_TRACT
  Filled 2023-04-30: qty 7

## 2023-04-30 MED ORDER — AMLODIPINE BESYLATE 5 MG PO TABS
5.0000 mg | ORAL_TABLET | Freq: Every day | ORAL | Status: DC
  Administered 2023-05-01: 5 mg via ORAL
  Filled 2023-04-30: qty 1

## 2023-04-30 MED ORDER — MIDAZOLAM HCL 2 MG/2ML IJ SOLN: 2.0000 mg | Freq: Once | INTRAMUSCULAR | Status: DC

## 2023-04-30 MED ORDER — FENTANYL CITRATE (PF) 100 MCG/2ML IJ SOLN
INTRAMUSCULAR | Status: AC
Start: 2023-04-30 — End: ?
  Filled 2023-04-30: qty 2

## 2023-04-30 MED ORDER — OXYCODONE-ACETAMINOPHEN 5-325 MG PO TABS: 1.0000 | ORAL_TABLET | ORAL | 0 refills | Status: DC | PRN

## 2023-04-30 MED ORDER — MOMETASONE FURO-FORMOTEROL FUM 200-5 MCG/ACT IN AERO
2.0000 | INHALATION_SPRAY | Freq: Two times a day (BID) | RESPIRATORY_TRACT | Status: DC
  Administered 2023-04-30 – 2023-05-01 (×2): 2 via RESPIRATORY_TRACT
  Filled 2023-04-30: qty 8.8

## 2023-04-30 MED ORDER — VANCOMYCIN HCL 1000 MG IV SOLR
INTRAVENOUS | Status: DC | PRN
  Administered 2023-04-30: 1000 mg via TOPICAL

## 2023-04-30 MED ORDER — SUGAMMADEX SODIUM 200 MG/2ML IV SOLN
INTRAVENOUS | Status: DC | PRN
  Administered 2023-04-30: 100 mg via INTRAVENOUS

## 2023-04-30 MED ORDER — ALPRAZOLAM 0.5 MG PO TABS: 2.0000 mg | ORAL_TABLET | Freq: Four times a day (QID) | ORAL | Status: DC | PRN

## 2023-04-30 MED ORDER — ONDANSETRON HCL 4 MG PO TABS: 4.0000 mg | ORAL_TABLET | Freq: Three times a day (TID) | ORAL | 0 refills | Status: AC | PRN

## 2023-04-30 MED ORDER — PANTOPRAZOLE SODIUM 40 MG PO TBEC
40.0000 mg | DELAYED_RELEASE_TABLET | Freq: Every day | ORAL | Status: DC
  Administered 2023-05-01: 40 mg via ORAL
  Filled 2023-04-30: qty 1

## 2023-04-30 MED ORDER — SODIUM CHLORIDE 1 G PO TABS
1.0000 g | ORAL_TABLET | Freq: Two times a day (BID) | ORAL | Status: DC
  Administered 2023-04-30: 1 g via ORAL
  Filled 2023-04-30 (×2): qty 1

## 2023-04-30 MED ORDER — METOCLOPRAMIDE HCL 5 MG/ML IJ SOLN: 5.0000 mg | Freq: Three times a day (TID) | INTRAMUSCULAR | Status: DC | PRN

## 2023-04-30 MED ORDER — ALBUTEROL SULFATE (2.5 MG/3ML) 0.083% IN NEBU: 3.0000 mL | INHALATION_SOLUTION | Freq: Four times a day (QID) | RESPIRATORY_TRACT | Status: DC | PRN

## 2023-04-30 MED ORDER — SUGAMMADEX SODIUM 200 MG/2ML IV SOLN
INTRAVENOUS | Status: AC
  Filled 2023-04-30: qty 2

## 2023-04-30 MED ORDER — LACTATED RINGERS IV SOLN: INTRAVENOUS | Status: DC

## 2023-04-30 MED ORDER — ROCURONIUM BROMIDE 10 MG/ML (PF) SYRINGE
PREFILLED_SYRINGE | INTRAVENOUS | Status: AC
  Filled 2023-04-30: qty 10

## 2023-04-30 MED ORDER — HYDROMORPHONE HCL 1 MG/ML IJ SOLN: 0.5000 mg | INTRAMUSCULAR | Status: DC | PRN

## 2023-04-30 MED ORDER — SODIUM CHLORIDE 1 G PO TABS
2.0000 g | ORAL_TABLET | Freq: Every day | ORAL | Status: DC
  Administered 2023-05-01: 2 g via ORAL
  Filled 2023-04-30: qty 2

## 2023-04-30 MED ORDER — FAMOTIDINE 20 MG PO TABS: 20.0000 mg | ORAL_TABLET | Freq: Every day | ORAL | Status: DC | PRN

## 2023-04-30 MED ORDER — DEXAMETHASONE SODIUM PHOSPHATE 10 MG/ML IJ SOLN
INTRAMUSCULAR | Status: AC
  Filled 2023-04-30: qty 1

## 2023-04-30 MED ORDER — BUPIVACAINE LIPOSOME 1.3 % IJ SUSP
INTRAMUSCULAR | Status: DC | PRN
  Administered 2023-04-30: 10 mL

## 2023-04-30 MED ORDER — ARMODAFINIL 200 MG PO TABS: 100.0000 mg | ORAL_TABLET | Freq: Every day | ORAL | Status: DC

## 2023-04-30 MED ORDER — FENTANYL CITRATE PF 50 MCG/ML IJ SOSY: 25.0000 ug | PREFILLED_SYRINGE | INTRAMUSCULAR | Status: DC | PRN

## 2023-04-30 MED ORDER — ATORVASTATIN CALCIUM 20 MG PO TABS
20.0000 mg | ORAL_TABLET | Freq: Every day | ORAL | Status: DC
  Administered 2023-05-01: 20 mg via ORAL
  Filled 2023-04-30 (×2): qty 1

## 2023-04-30 MED ORDER — CHLORHEXIDINE GLUCONATE 0.12 % MT SOLN
15.0000 mL | Freq: Once | OROMUCOSAL | Status: AC
  Administered 2023-04-30: 15 mL via OROMUCOSAL

## 2023-04-30 MED ORDER — STERILE WATER FOR IRRIGATION IR SOLN
Status: DC | PRN
  Administered 2023-04-30: 1000 mL

## 2023-04-30 MED ORDER — METOCLOPRAMIDE HCL 5 MG PO TABS: 5.0000 mg | ORAL_TABLET | Freq: Three times a day (TID) | ORAL | Status: DC | PRN

## 2023-04-30 MED ORDER — OXYCODONE HCL 5 MG/5ML PO SOLN: 5.0000 mg | Freq: Once | ORAL | Status: DC | PRN

## 2023-04-30 MED ORDER — ROCURONIUM BROMIDE 100 MG/10ML IV SOLN
INTRAVENOUS | Status: DC | PRN
  Administered 2023-04-30: 10 mg via INTRAVENOUS
  Administered 2023-04-30: 50 mg via INTRAVENOUS
  Administered 2023-04-30: 10 mg via INTRAVENOUS

## 2023-04-30 MED ORDER — CEFAZOLIN SODIUM-DEXTROSE 2-4 GM/100ML-% IV SOLN
2.0000 g | INTRAVENOUS | Status: AC
  Administered 2023-04-30: 2 g via INTRAVENOUS
  Filled 2023-04-30: qty 100

## 2023-04-30 MED ORDER — FENTANYL CITRATE PF 50 MCG/ML IJ SOSY
100.0000 ug | PREFILLED_SYRINGE | Freq: Once | INTRAMUSCULAR | Status: AC
  Administered 2023-04-30: 50 ug via INTRAVENOUS
  Filled 2023-04-30: qty 2

## 2023-04-30 MED ORDER — NAPROXEN 500 MG PO TABS: 500.0000 mg | ORAL_TABLET | Freq: Two times a day (BID) | ORAL | 1 refills | Status: AC

## 2023-04-30 MED ORDER — ONDANSETRON HCL 4 MG/2ML IJ SOLN
INTRAMUSCULAR | Status: AC
  Filled 2023-04-30: qty 2

## 2023-04-30 MED ORDER — PROPOFOL 10 MG/ML IV BOLUS
INTRAVENOUS | Status: DC | PRN
  Administered 2023-04-30: 80 mg via INTRAVENOUS

## 2023-04-30 MED ORDER — METOCLOPRAMIDE HCL 5 MG PO TABS
5.0000 mg | ORAL_TABLET | Freq: Three times a day (TID) | ORAL | Status: DC | PRN
Start: 2023-04-30 — End: 2023-05-01

## 2023-04-30 MED ORDER — OXYCODONE HCL 5 MG PO TABS
5.0000 mg | ORAL_TABLET | ORAL | Status: DC | PRN
  Administered 2023-04-30 – 2023-05-01 (×3): 5 mg via ORAL
  Filled 2023-04-30: qty 2
  Filled 2023-04-30 (×2): qty 1

## 2023-04-30 MED ORDER — CYCLOBENZAPRINE HCL 10 MG PO TABS: 10.0000 mg | ORAL_TABLET | Freq: Three times a day (TID) | ORAL | 1 refills | Status: AC | PRN

## 2023-04-30 MED ORDER — MODAFINIL 200 MG PO TABS
200.0000 mg | ORAL_TABLET | Freq: Every day | ORAL | Status: DC
  Administered 2023-05-01: 200 mg via ORAL
  Filled 2023-04-30 (×2): qty 1

## 2023-04-30 MED ORDER — DEXAMETHASONE SODIUM PHOSPHATE 10 MG/ML IJ SOLN
INTRAMUSCULAR | Status: DC | PRN
  Administered 2023-04-30: 5 mg via INTRAVENOUS

## 2023-04-30 MED ORDER — DROPERIDOL 2.5 MG/ML IJ SOLN: 0.6250 mg | Freq: Once | INTRAMUSCULAR | Status: DC | PRN

## 2023-04-30 MED ORDER — METHOCARBAMOL 1000 MG/10ML IJ SOLN: 500.0000 mg | Freq: Four times a day (QID) | INTRAMUSCULAR | Status: DC | PRN

## 2023-04-30 MED ORDER — METHOCARBAMOL 500 MG PO TABS
500.0000 mg | ORAL_TABLET | Freq: Four times a day (QID) | ORAL | Status: DC | PRN
Start: 2023-04-30 — End: 2023-05-01
  Administered 2023-05-01: 500 mg via ORAL
  Filled 2023-04-30: qty 1

## 2023-04-30 MED ORDER — FENTANYL CITRATE (PF) 100 MCG/2ML IJ SOLN
INTRAMUSCULAR | Status: DC | PRN
  Administered 2023-04-30 (×2): 50 ug via INTRAVENOUS

## 2023-04-30 MED ORDER — BUPIVACAINE HCL (PF) 0.5 % IJ SOLN
INTRAMUSCULAR | Status: DC | PRN
  Administered 2023-04-30: 10 mL

## 2023-04-30 MED ORDER — SODIUM CHLORIDE 0.9 % IV SOLN: 1000.0000 mg | INTRAVENOUS | Status: DC

## 2023-04-30 MED ORDER — IPRATROPIUM-ALBUTEROL 0.5-2.5 (3) MG/3ML IN SOLN: 3.0000 mL | RESPIRATORY_TRACT | Status: DC

## 2023-04-30 MED ORDER — ACETAMINOPHEN 325 MG PO TABS
325.0000 mg | ORAL_TABLET | Freq: Four times a day (QID) | ORAL | Status: DC | PRN
  Administered 2023-05-01: 650 mg via ORAL
  Filled 2023-04-30 (×2): qty 2

## 2023-04-30 MED ORDER — GABAPENTIN 100 MG PO CAPS
100.0000 mg | ORAL_CAPSULE | Freq: Three times a day (TID) | ORAL | Status: DC
  Administered 2023-04-30 – 2023-05-01 (×3): 100 mg via ORAL
  Filled 2023-04-30 (×3): qty 1

## 2023-04-30 SURGICAL SUPPLY — 73 items
BAG COUNTER SPONGE SURGICOUNT (BAG) IMPLANT
BAG ZIPLOCK 12X15 (MISCELLANEOUS) ×1 IMPLANT
BIT DRILL AR 3 NS (BIT) IMPLANT
BLADE SAW SGTL 83.5X18.5 (BLADE) ×1 IMPLANT
BNDG COHESIVE 4X5 TAN STRL LF (GAUZE/BANDAGES/DRESSINGS) ×1 IMPLANT
CLSR STERI-STRIP ANTIMIC 1/2X4 (GAUZE/BANDAGES/DRESSINGS) IMPLANT
COOLER ICEMAN CLASSIC (MISCELLANEOUS) ×1 IMPLANT
COVER BACK TABLE 60X90IN (DRAPES) ×1 IMPLANT
COVER SURGICAL LIGHT HANDLE (MISCELLANEOUS) ×1 IMPLANT
CUP SUT UNIV REV NEUTRAL 33 (Cup) IMPLANT
DRAPE SHEET LG 3/4 BI-LAMINATE (DRAPES) ×1 IMPLANT
DRAPE STERI IOBAN 125X83 (DRAPES) ×1 IMPLANT
DRAPE SURG 17X11 SM STRL (DRAPES) ×1 IMPLANT
DRAPE SURG ORHT 6 SPLT 77X108 (DRAPES) ×2 IMPLANT
DRAPE TOP 10253 STERILE (DRAPES) ×1 IMPLANT
DRAPE U-SHAPE 47X51 STRL (DRAPES) ×1 IMPLANT
DRESSING AQUACEL AG SP 3.5X6 (GAUZE/BANDAGES/DRESSINGS) ×1 IMPLANT
DRSG AQUACEL AG ADV 3.5X10 (GAUZE/BANDAGES/DRESSINGS) IMPLANT
DRSG AQUACEL AG SP 3.5X6 (GAUZE/BANDAGES/DRESSINGS) ×1 IMPLANT
DRSG EMULSION OIL 3X16 NADH (GAUZE/BANDAGES/DRESSINGS) ×1 IMPLANT
DURAPREP 26ML APPLICATOR (WOUND CARE) ×1 IMPLANT
ELECT BLADE TIP CTD 4 INCH (ELECTRODE) ×1 IMPLANT
ELECT PENCIL ROCKER SW 15FT (MISCELLANEOUS) ×1 IMPLANT
ELECT REM PT RETURN 15FT ADLT (MISCELLANEOUS) ×1 IMPLANT
FACESHIELD WRAPAROUND (MASK) ×5 IMPLANT
FACESHIELD WRAPAROUND OR TEAM (MASK) ×5 IMPLANT
GAUZE PAD ABD 8X10 STRL (GAUZE/BANDAGES/DRESSINGS) ×1 IMPLANT
GAUZE SPONGE 4X4 12PLY STRL (GAUZE/BANDAGES/DRESSINGS) ×1 IMPLANT
GLENOID UNI REV MOD 24 +2 LAT (Joint) IMPLANT
GLENOSPHERE 33+4 LAT/24 UNI RV (Joint) IMPLANT
GLOVE BIO SURGEON STRL SZ7.5 (GLOVE) ×1 IMPLANT
GLOVE BIO SURGEON STRL SZ8 (GLOVE) ×1 IMPLANT
GLOVE ECLIPSE 7.5 STRL STRAW (GLOVE) ×1 IMPLANT
GLOVE SS BIOGEL STRL SZ 7 (GLOVE) ×1 IMPLANT
GLOVE SS BIOGEL STRL SZ 7.5 (GLOVE) ×1 IMPLANT
GOWN STRL SURGICAL XL XLNG (GOWN DISPOSABLE) ×2 IMPLANT
KIT BASIN OR (CUSTOM PROCEDURE TRAY) ×1 IMPLANT
KIT TURNOVER KIT A (KITS) ×1 IMPLANT
LINER HUM CONST SHLD 33 +6 (Liner) IMPLANT
MANIFOLD NEPTUNE II (INSTRUMENTS) ×1 IMPLANT
NDL TAPERED W/ NITINOL LOOP (MISCELLANEOUS) ×1 IMPLANT
NEEDLE TAPERED W/ NITINOL LOOP (MISCELLANEOUS) ×1 IMPLANT
NS IRRIG 1000ML POUR BTL (IV SOLUTION) ×1 IMPLANT
PACK GENERAL/GYN (CUSTOM PROCEDURE TRAY) ×1 IMPLANT
PACK SHOULDER (CUSTOM PROCEDURE TRAY) ×1 IMPLANT
PAD ARMBOARD POSITIONER FOAM (MISCELLANEOUS) ×1 IMPLANT
PAD COLD SHLDR WRAP-ON (PAD) ×1 IMPLANT
PIN NITINOL TARGETER 2.8 (PIN) IMPLANT
PIN SET MODULAR GLENOID SYSTEM (PIN) IMPLANT
PROTECTOR NERVE ULNAR (MISCELLANEOUS) ×1 IMPLANT
RESTRAINT HEAD UNIVERSAL NS (MISCELLANEOUS) ×1 IMPLANT
SCREW CENTRAL MODULAR 25 (Screw) IMPLANT
SCREW PERI LOCK 5.5X16 (Screw) IMPLANT
SCREW PERI LOCK 5.5X32 (Screw) IMPLANT
SLING ARM FOAM STRAP LRG (SOFTGOODS) IMPLANT
SLING ARM FOAM STRAP MED (SOFTGOODS) IMPLANT
STAPLER SKIN PROX WIDE 3.9 (STAPLE) IMPLANT
STEM HUMERAL UNI REVERS SZ9 (Stem) IMPLANT
STRIP CLOSURE SKIN 1/2X4 (GAUZE/BANDAGES/DRESSINGS) ×1 IMPLANT
SUT MNCRL AB 3-0 PS2 18 (SUTURE) ×1 IMPLANT
SUT MNCRL AB 4-0 PS2 18 (SUTURE) IMPLANT
SUT MON AB 2-0 CT1 36 (SUTURE) ×1 IMPLANT
SUT VIC AB 1 CT1 27XBRD ANTBC (SUTURE) ×1 IMPLANT
SUT VIC AB 1 CT1 36 (SUTURE) ×1 IMPLANT
SUT VIC AB 2-0 CT1 TAPERPNT 27 (SUTURE) ×1 IMPLANT
SUTURE TAPE 1.3 40 TPR END (SUTURE) ×2 IMPLANT
SUTURETAPE 1.3 40 TPR END (SUTURE) ×2 IMPLANT
TOWEL GREEN STERILE FF (TOWEL DISPOSABLE) ×1 IMPLANT
TOWEL OR 17X26 10 PK STRL BLUE (TOWEL DISPOSABLE) ×1 IMPLANT
TUBE SUCTION HIGH CAP CLEAR NV (SUCTIONS) ×1 IMPLANT
TUBING CONNECTING 10 (TUBING) ×1 IMPLANT
WATER STERILE IRR 1000ML POUR (IV SOLUTION) ×2 IMPLANT
YANKAUER SUCT BULB TIP 10FT TU (MISCELLANEOUS) ×1 IMPLANT

## 2023-04-30 NOTE — Anesthesia Postprocedure Evaluation (Signed)
 Anesthesia Post Note  Patient: Gabriella Farrell  Procedure(s) Performed: REMOVAL, HARDWARE (Left: Shoulder) ARTHROPLASTY, SHOULDER, TOTAL, REVERSE (Left: Shoulder)     Patient location during evaluation: PACU Anesthesia Type: Regional and General Level of consciousness: awake and alert Pain management: pain level controlled Vital Signs Assessment: post-procedure vital signs reviewed and stable Respiratory status: spontaneous breathing, nonlabored ventilation, respiratory function stable and patient connected to nasal cannula oxygen Cardiovascular status: blood pressure returned to baseline and stable Postop Assessment: no apparent nausea or vomiting Anesthetic complications: no   No notable events documented.  Last Vitals:  Vitals:   04/30/23 1330 04/30/23 1337  BP: 130/70   Pulse: 76   Resp: 15   Temp:    SpO2: 94% 93%    Last Pain:  Vitals:   04/30/23 1330  TempSrc:   PainSc: 3                  [place] Nation

## 2023-04-30 NOTE — Transfer of Care (Signed)
 Immediate Anesthesia Transfer of Care Note  Patient: Gabriella Farrell  Procedure(s) Performed: REMOVAL, HARDWARE (Left: Shoulder) ARTHROPLASTY, SHOULDER, TOTAL, REVERSE (Left: Shoulder)  Patient Location: PACU  Anesthesia Type:General and Regional  Level of Consciousness: awake and drowsy  Airway & Oxygen Therapy: Patient Spontanous Breathing and Patient connected to face mask oxygen  Post-op Assessment: Report given to RN and Post -op Vital signs reviewed and stable  Post vital signs: Reviewed and stable  Last Vitals:  Vitals Value Taken Time  BP 155/75 04/30/23 1238  Temp    Pulse 97 04/30/23 1244  Resp 22 04/30/23 1244  SpO2 98 % 04/30/23 1244  Vitals shown include unfiled device data.  Last Pain:  Vitals:   04/30/23 0910  TempSrc:   PainSc: 0-No pain         Complications: No notable events documented.

## 2023-04-30 NOTE — Telephone Encounter (Signed)
 Received verbal from patient to speak with spouse, Louis. Louis is aware of below message and voiced his understanding.  Nothing further needed.

## 2023-04-30 NOTE — Anesthesia Procedure Notes (Addendum)
 Anesthesia Regional Block: Interscalene brachial plexus block   Pre-Anesthetic Checklist: , timeout performed,  Correct Patient, Correct Site, Correct Laterality,  Correct Procedure, Correct Position, site marked,  Risks and benefits discussed,  Surgical consent,  Pre-op evaluation,  At surgeon's request and post-op pain management  Laterality: Left  Prep: chloraprep       Needles:  Injection technique: Single-shot  Needle Type: Echogenic Stimulator Needle     Needle Length: 9cm  Needle Gauge: 21     Additional Needles:   Procedures:,,,, ultrasound used (permanent image in chart),,    Narrative:  Start time: 04/30/2023 8:30 AM End time: 04/30/2023 8:35 AM Injection made incrementally with aspirations every 5 mL.  Performed by: Personally  Anesthesiologist: Ulm Nation, MD  Additional Notes: Discussed risks and benefits of the nerve block in detail, including but not limited vascular injury, permanent nerve damage and infection.   Patient tolerated the procedure well. Local anesthetic introduced in an incremental fashion under minimal resistance after negative aspirations. No paresthesias were elicited. After completion of the procedure, no acute issues were identified and patient continued to be monitored by RN.

## 2023-04-30 NOTE — Anesthesia Preprocedure Evaluation (Addendum)
 Anesthesia Evaluation  Patient identified by MRN, date of birth, ID band Patient awake    Reviewed: Allergy & Precautions, H&P , NPO status , Patient's Chart, lab work & pertinent test results  Airway Mallampati: II  TM Distance: >3 FB Neck ROM: Full    Dental  (+) Upper Dentures, Lower Dentures   Pulmonary sleep apnea , COPD, Current Smoker and Patient abstained from smoking.   Pulmonary exam normal breath sounds clear to auscultation       Cardiovascular hypertension, Normal cardiovascular exam Rhythm:Regular Rate:Normal     Neuro/Psych  PSYCHIATRIC DISORDERS Anxiety Depression    negative neurological ROS     GI/Hepatic Neg liver ROS,GERD  ,,  Endo/Other  negative endocrine ROS    Renal/GU CRFRenal disease  negative genitourinary   Musculoskeletal  (+) Arthritis ,    Abdominal   Peds negative pediatric ROS (+)  Hematology negative hematology ROS (+)   Anesthesia Other Findings Lupus  Reproductive/Obstetrics negative OB ROS                             Anesthesia Physical Anesthesia Plan  ASA: 3  Anesthesia Plan: General and Regional   Post-op Pain Management: Regional block* and Tylenol PO (pre-op)*   Induction: Intravenous  PONV Risk Score and Plan: 2 and Ondansetron, Dexamethasone and Treatment may vary due to age or medical condition  Airway Management Planned: Oral ETT  Additional Equipment: None  Intra-op Plan:   Post-operative Plan: Extubation in OR  Informed Consent: I have reviewed the patients History and Physical, chart, labs and discussed the procedure including the risks, benefits and alternatives for the proposed anesthesia with the patient or authorized representative who has indicated his/her understanding and acceptance.     Dental advisory given  Plan Discussed with: CRNA  Anesthesia Plan Comments:         Anesthesia Quick Evaluation

## 2023-04-30 NOTE — Anesthesia Procedure Notes (Signed)
 Procedure Name: Intubation Date/Time: 04/30/2023 10:06 AM  Performed by: Theodosia Quay, CRNAPre-anesthesia Checklist: Patient identified, Emergency Drugs available, Suction available, Timeout performed and Patient being monitored Patient Re-evaluated:Patient Re-evaluated prior to induction Oxygen Delivery Method: Circle system utilized Preoxygenation: Pre-oxygenation with 100% oxygen Induction Type: IV induction Ventilation: Mask ventilation without difficulty Laryngoscope Size: Miller and 2 Grade View: Grade I Tube type: Oral Tube size: 7.0 mm Number of attempts: 1 Airway Equipment and Method: Patient positioned with wedge pillow and Stylet Placement Confirmation: ETT inserted through vocal cords under direct vision, positive ETCO2 and breath sounds checked- equal and bilateral Secured at: 23 cm Tube secured with: Tape Dental Injury: Teeth and Oropharynx as per pre-operative assessment

## 2023-04-30 NOTE — Discharge Instructions (Signed)

## 2023-04-30 NOTE — H&P (Signed)
 Gabriella Farrell    Chief Complaint: POST TRAUMATIC ARTHRITIS HPI: The patient is a 68 y.o. female with remote history of left shoulder proximal humerus fracture status post open reduction and internal fixation who is now progressed to posttraumatic arthritis with retained hardware.  Due to her increasing pain and functional limitations she is brought to the operating this time for planned hardware removal and conversion to reverse shoulder arthroplasty  Past Medical History:  Diagnosis Date   Anxiety    Arthritis    Cancer (HCC)    lesion on right side of neck   Chronic kidney disease    COPD (chronic obstructive pulmonary disease) (HCC)    Depression    Discoid lupus erythematosus    GERD (gastroesophageal reflux disease)    H/O chronic pancreatitis    H/O ETOH abuse    Hypertension    Multiple pulmonary nodules    Stable per Pulmonology   SIADH (syndrome of inappropriate ADH production) (HCC)    Sleep apnea    no CPAP   Smoker       Past Surgical History:  Procedure Laterality Date   BREAST EXCISIONAL BIOPSY Right    CHOLECYSTECTOMY  2010   open cholecystectomy and also repaired ducts in pancreas   COLONOSCOPY W/ BIOPSIES AND POLYPECTOMY     x 5   ESOPHAGOGASTRODUODENOSCOPY     x 3   EYE SURGERY Bilateral 2020   cataract extractons   SHOULDER SURGERY Left 2016   for Fx Humerus and dislocation   WISDOM TOOTH EXTRACTION      Family History  Problem Relation Age of Onset   Cancer Mother    Heart attack Mother 25   Heart disease Sister    Cancer - Lung Brother    Melanoma Sister     Social History:  reports that she has been smoking cigarettes. She started smoking about 49 years ago. She has a 49.2 pack-year smoking history. She has never used smokeless tobacco. She reports current alcohol use of about 15.0 standard drinks of alcohol per week. She reports that she does not use drugs.  BMI: There is no height or weight on file to calculate BMI.  Lab Results   Component Value Date   ALBUMIN 4.3 04/28/2023   Diabetes: Patient does not have a diagnosis of diabetes.     Smoking Status: Social History   Tobacco Use  Smoking Status Every Day   Current packs/day: 1.00   Average packs/day: 1 pack/day for 49.2 years (49.2 ttl pk-yrs)   Types: Cigarettes   Start date: 1976  Smokeless Tobacco Never  Tobacco Comments   Currently smoking 1ppd a day as of 02/06/21  ep   Ready to quit: Not Answered Counseling given: Not Answered Tobacco comments: Currently smoking 1ppd a day as of 02/06/21  ep  The patient has been counseled regarding tobacco cessation and has made concerted efforts towards reducing tobacco intake.          No medications prior to admission.     Physical Exam: Inspection of the left shoulder demonstrates a well-healed anterior deltopectoral incision.  The deltoid is noted to be intact and firing.  Painful and guarded motion as noted at recent office visits.  Fair strength to manual muscle testing.  Plain radiographs are reviewed demonstrating penetration of the hardware into the shoulder joint with underlying glenohumeral arthritis.  Vitals     Assessment/Plan  Impression: POST TRAUMATIC ARTHRITIS  Plan of Action: Procedure(s): REMOVAL, HARDWARE ARTHROPLASTY, SHOULDER,  TOTAL, REVERSE  Lakara Weiland M Alexsis Kathman 04/30/2023, 6:41 AM Contact # [phone]

## 2023-04-30 NOTE — Op Note (Signed)
 04/30/2023  12:12 PM  PATIENT:   Gabriella Farrell  68 y.o. female  PRE-OPERATIVE DIAGNOSIS: Left shoulder POST TRAUMATIC ARTHRITIS  POST-OPERATIVE DIAGNOSIS: Same  PROCEDURE: #1 removal of retained hardware left proximal humerus, #2 left shoulder reverse arthroplasty lysing a press-fit size 9 Arthrex stem with a neutral metathesis, +6 constrained polyethylene insert, 33/+4 glenosphere and a small/+2 baseplate  SURGEON:  Suleika Donavan, Vania Rea M.D.  ASSISTANTS: Ralene Bathe, PA-C  Ralene Bathe, PA-C was utilized as an Geophysicist/field seismologist throughout this case, essential for help with positioning the patient, positioning extremity, tissue manipulation, implantation of the prosthesis, suture management, wound closure, and intraoperative decision-making.  ANESTHESIA:   General Endotracheal and interscalene block with Exparel  EBL: 150 cc  SPECIMEN: None  Drains: None   PATIENT DISPOSITION:  PACU - hemodynamically stable.    PLAN OF CARE: Discharge to home after PACU  Brief history:  Patient is a 68 year old female who sustained a left proximal humeral fracture dislocation approximately 7 years ago and underwent an original open reduction and internal fixation which went on to solid bony union.  For the last several years however she is noted progressive increasing left shoulder pain related to posttraumatic arthritis.  Plain radiographs do show prominence of the hardware penetrating through the humeral head.  Due to her increasing pain and failure to respond to prolonged attempts at conservative management, she is brought to the operating today for planned left shoulder hardware removal and reverse shoulder arthroplasty.  Preoperatively, I counseled the patient regarding treatment options and risks versus benefits thereof.  Possible surgical complications were all reviewed including potential for bleeding, infection, neurovascular injury, persistent pain, loss of motion, anesthetic complication,  failure of the implant, and possible need for additional surgery. They understand and accept and agrees with our planned procedure.   Procedure detail:  After undergoing routine preop evaluation the patient received prophylactic antibiotics and interscalene block with Exparel was established in the holding area by the anesthesia department.  Subsequently placed spine on the operating table and underwent the smooth induction of a general endotracheal anesthesia.  Placed into the beachchair position and appropriately padded and protected.  The left shoulder girdle region was sterilely prepped and draped in standard fashion.  Timeout was called.  An approach to the left shoulder was made through the previous incision which was somewhat more lateral and orientation then our typical standard deltopectoral.  We did reopen this incision from the superior anterior aspect of the acromion and extending distally total length approximately 20 cm.  Skin flaps were elevated and dissection carried deeply and proximally we then elevated the skin flap such that we could identify the deltopectoral interval and the vein was then mobilized and retracted laterally with the deltoid and the deltopectoral interval was then followed distally and then we identified the proximal humeral plate which we dissected free from adhesions beneath the deltoid and then extending distally elevating the deltoid more distally and separating the deltopectoral insertions and using subperiosteal dissection to elevate the tissues that had become adherent to the plate more distally and used subperiosteal dissection extending to the most distal aspect of the plate.  We did identify areas of attenuation of the cephalic vein which did require ligation.  We then gained complete exposure of the plate anterolaterally and the screws were then removed sequentially from distal to proximal using the appropriate screwdriver and the plate was then mobilized and  removed.  Upon completion we then returned our attention proximally where the conjoined tendon  was mobilized and retracted medially and adhesions were divided beneath the deltoid.  We identified the long head biceps tendon which was tenodesed at the upper border of the pectoralis major with the proximal segment unroofed and excised and the superior rotator cuff was then split from the apex of the bicipital groove to the base of the coracoid and the subscap was separated from the lesser tuberosity using electrocautery with the tendon tagged with a pair of grasping suture tape sutures.  Capsular attachments were then divided from the anterior and inferior margins of the humeral neck and the humeral head was then delivered through the wound.  An extra medullary guide was then used to outline the proposed humeral head resection which we performed with an oscillating saw at 20 degrees retroversion.  A metal cap was then placed over the cut proximal humeral surface we then exposed the glenoid performing a circumferential labral resection.  Based on the size of the metaphysis we selected a 33 glenosphere sizing is appropriate.  A guidepin was directed into the center of the glenoid and the glenoid was then prepared with the central followed by the peripheral reamer to a stable subchondral bony bed and all debris was carefully removed.  Preparation completed with a drill and tapped for a 25 mm lag screw.  Our baseplate was then assembled and inserted with vancomycin powder applied to the threads of the lag screw.  The peripheral locking screws were all then placed using standard technique and excellent fixation was achieved.  A 33/+4 glenosphere was then impacted onto the baseplate and the central locking screw was placed.  We then returned our attention back to the proximal humerus where the canal was opened by hand reaming and we ultimately broached up to a size 9 humeral stem at 20 degrees of retroversion.  A neutral  metaphyseal reaming guide was then used to prepare the metaphysis.  A trial implant was then placed and trial reduction showed excellent motion stability and soft tissue balance.  At this point the trial was removed.  A final implant was assembled.  The canal was irrigated cleaned and dried with vancomycin powder within the canal.  There had been some excavation of the metaphyseal and diaphyseal bone related to the previous plate and screws so we did harvest bone from the resected humeral head to apply bone graft about the metaphyseal region as we impacted our stem and excellent fixation was achieved.  A series of trial reductions was then performed and ultimately felt that a +6 poly gives the best motion stability and soft tissue balance.  The trial was then removed.  The final +6 constrained poly was then impacted onto the implant and a final reduction showed good motion stability and soft tissue balance.  This point the wound was copes irrigated.  Final hemostasis was obtained.  The subscapularis was mobilized and confirmed to have good elasticity and was repaired back to the eyelets on the collar of the implant using the previously placed suture tape sutures.  We then repaired the distal aspect of the exposure reapproximating the tendinous insertion between the pectoralis and the deltoid and then the deltopectoral interval was reapproximated with a series of figure-of-eight number Vicryl sutures.  2-0 Monocryl used to close the subcu layer and intracuticular 3-0 Monocryl used to close the skin followed by Steri-Strips and an Aquacel dressing.  The left arm was then placed into a sling immobilizer and the patient was awakened, extubated, and taken to the recovery  room in stable condition.  Senaida Lange MD   Contact # [phone]

## 2023-05-01 ENCOUNTER — Encounter (HOSPITAL_COMMUNITY): Payer: Self-pay | Admitting: Orthopedic Surgery

## 2023-05-01 DIAGNOSIS — M19112 Post-traumatic osteoarthritis, left shoulder: Secondary | ICD-10-CM | POA: Diagnosis not present

## 2023-05-01 NOTE — Progress Notes (Signed)
Patient discharged to home w/ family. Given all belongings, instructions. Verbalized understanding of instructions. Escorted to pov via w/c. 

## 2023-05-01 NOTE — Evaluation (Signed)
 Occupational Therapy Evaluation Patient Details Name: Gabriella Farrell MRN: [id number] DOB: 1955-03-10 Today's Date: 05/01/2023   History of Present Illness   Gabriella Farrell is a 68 y.o. admitted on 04/30/2023 with a diagnosis of POST TRAUMATIC ARTHRITIS.  They were brought to the operating room on 04/30/2023 and underwent Procedure(s):  REMOVAL, HARDWARE  ARTHROPLASTY, SHOULDER, TOTAL, REVERSE.     Clinical Impressions Pt is a 68 year old female, s/p reverse shoulder replacement without functional use of LT non-dominant upper extremity secondary to effects of surgery and interscalene block and shoulder precautions. Therapist provided education and instruction to patient and spouse in regards to exercises, precautions, positioning, donning upper extremity clothing and bathing while maintaining shoulder precautions, ice and edema management and donning/doffing sling. Patient and spouse verbalized understanding and demonstrated as needed. Patient needed assistance to donn shirt, underwear, pants, socks and shoes and provided with instruction on compensatory strategies to perform ADLs. Patient limited by decreased ROM in LT shoulder so therefore will need some form of assistance at home. Patient and spouse verbalized and/or demonstrated understanding to all instruction. Patient to follow up with MD for further therapy needs.       If plan is discharge home, recommend the following:   A little help with bathing/dressing/bathroom     Functional Status Assessment   Patient has had a recent decline in their functional status and demonstrates the ability to make significant improvements in function in a reasonable and predictable amount of time.     Equipment Recommendations   None recommended by OT     Recommendations for Other Services         Precautions/Restrictions   Precautions Precautions: Shoulder Shoulder Interventions: Shoulder sling/immobilizer;At all times;Off for  dressing/bathing/exercises Precaution Booklet Issued: Yes (comment) Recall of Precautions/Restrictions: Intact Precaution/Restrictions Comments: If sitting in controlled environment, ok to come out of sling to give neck a break. Please sleep in it to protect until follow up in office.     OK to use operative arm for feeding, hygiene and ADLs.   Ok to instruct Pendulums and lap slides as exercises. Ok to use operative arm within the following parameters for ADL purposes     New ROM (4/25)   Ok for PROM, AAROM, AROM within pain tolerance and within the following ROM   ER 20   ABD 45   FE 60       Please instruct Ice machine usage     Mobility Bed Mobility Overal bed mobility: Needs Assistance Bed Mobility: Supine to Sit     Supine to sit: Min assist     General bed mobility comments: Spouse offered his hand and pt pulled up with RUE. Pt plans to sleep in recliner    Transfers Overall transfer level: Modified independent                        Balance Overall balance assessment: Mild deficits observed, not formally tested                                         ADL either performed or assessed with clinical judgement   ADL  General ADL Comments:  Per orders, LT shoulder parameters as follows for ADL tasks: Abd 0-45; ER 0-20; FF 0-60; No shoulder ROM; elbow/wrist/hand ROM only. While moving within specified parameters, pt/caregiver instructed on bathing and how to donn/doff shirt, placing operative arm through sleeve first when donning and off last when doffing.Pt/caregiver educated on compensatory strategies for LB ADL and strategies to reduce risk of falls.  Pt/caregiver educated on donning/doffing sling and to wear the sling at all times with the exception of ADL, and to loosen the neck strap of the sling when the operative arm is in a supported position when sitting. In sitting or supine, pt instructed  to have a pillow behind and under their operative arm to provide support. If assist needed with ambulation, caregiver educated on the importance of walking on pt's non-operative side.  Education regarding use of "IceMan" Cold Therapy completed, including the importance of using a barrier on the shoulder prior to positioning the wrap-on pad. Pt/caregiver verbalized/demonstrated understanding. Teach Back used while caregiver assisted with dressing pt and positioning "wrap-on pad" to facilitate DC.      Vision Baseline Vision/History:  (readers only) Ability to See in Adequate Light: 0 Adequate Patient Visual Report: No change from baseline       Perception         Praxis         Pertinent Vitals/Pain Pain Assessment Pain Assessment: 0-10 Pain Location: Still numb Pain Intervention(s): Limited activity within patient's tolerance, Monitored during session, Repositioned, Ice applied     Extremity/Trunk Assessment Upper Extremity Assessment Upper Extremity Assessment: Right hand dominant;LUE deficits/detail;RUE deficits/detail RUE Deficits / Details: A little overuse syndrome, and gets tired. PT reports plan for RT shoulder cortizone shot. LUE: Unable to fully assess due to immobilization   Lower Extremity Assessment Lower Extremity Assessment: Overall WFL for tasks assessed   Cervical / Trunk Assessment Cervical / Trunk Assessment: Normal   Communication Communication Communication: No apparent difficulties   Cognition Arousal: Alert Behavior During Therapy: WFL for tasks assessed/performed Cognition: No apparent impairments                               Following commands: Intact       Cueing  General Comments          Exercises Other Exercises Other Exercises: Pt performed all hand/wrist/forearm/elbow exercises on RUE (LUE awake enough to demo hand and wrist. Pt demonstrate lap slides and pendulums on RUE to show understanding.   Shoulder  Instructions Shoulder Instructions Donning/doffing shirt without moving shoulder: Minimal assistance;Caregiver independent with task;Patient able to independently direct caregiver Method for sponge bathing under operated UE: Supervision/safety;Caregiver independent with task;Patient able to independently direct caregiver Donning/doffing sling/immobilizer: Moderate assistance;Caregiver independent with task;Patient able to independently direct caregiver Correct positioning of sling/immobilizer: Minimal assistance;Caregiver independent with task;Patient able to independently direct caregiver Pendulum exercises (written home exercise program): Caregiver independent with task;Patient able to independently direct caregiver (Pt demonstrated on RUE due to LUE numbness) ROM for elbow, wrist and digits of operated UE: Patient able to independently direct caregiver;Caregiver independent with task;Independent Sling wearing schedule (on at all times/off for ADL's): Independent;Caregiver independent with task;Patient able to independently direct caregiver Proper positioning of operated UE when showering: Supervision/safety;Caregiver independent with task;Patient able to independently direct caregiver Positioning of UE while sleeping: Set-up;Caregiver independent with task;Patient able to independently direct caregiver    Home Living Family/patient expects to be discharged to:: Private  residence Living Arrangements: Spouse/significant other Available Help at Discharge: Available 24 hours/day Type of Home: House Home Access: Stairs to enter [employer] of Steps: 3 Entrance Stairs-Rails: None Home Layout: Multi-level;Bed/bath upstairs Alternate Level Stairs-Number of Steps: Full flight with RT railing Alternate Level Stairs-Rails: Right Bathroom Shower/Tub: Walk-in shower (and tub/shower)   Bathroom Toilet: Standard     Home Equipment: Shower seat;Hand held shower head          Prior  Functioning/Environment Prior Level of Function : Independent/Modified Independent;Driving             Mobility Comments: Independent. ADLs Comments: Ind with light PRN assist fdue to pre-op pain. denied falls.    OT Problem List: Impaired UE functional use   OT Treatment/Interventions:        OT Goals(Current goals can be found in the care plan section)   Acute Rehab OT Goals OT Goal Formulation: All assessment and education complete, DC therapy ADL Goals Pt/caregiver will Perform Home Exercise Program: Left upper extremity (per post-op orders) Additional ADL Goal #1: Pt/CG will demonstrate UE/LE dressing, donning/doffing of sling, correct positioning of LT UE, and compensatory strategies for LT axilla hygiene, as well as use of Ice man cryo cuff, while correctly following all shoulder post-op precautions/restrictions.   OT Frequency:       Co-evaluation              AM-PAC OT "6 Clicks" Daily Activity     Outcome Measure Help from another person eating meals?: None Help from another person taking care of personal grooming?: None Help from another person toileting, which includes using toliet, bedpan, or urinal?: A Little Help from another person bathing (including washing, rinsing, drying)?: A Little Help from another person to put on and taking off regular upper body clothing?: A Little Help from another person to put on and taking off regular lower body clothing?: A Little 6 Click Score: 20   End of Session Nurse Communication: Other (comment) (Coordinated time of OT visit with RN)  Activity Tolerance: Patient tolerated treatment well Patient left: with family/visitor present (EOB)  OT Visit Diagnosis:  (decreased adls)                Time: 9563-8756 OT Time Calculation (min): 40 min Charges:  OT General Charges $OT Visit: 1 Visit OT Evaluation $OT Eval Low Complexity: 1 Low OT Treatments $Self Care/Home Management : 23-37 mins  Victorino Dike, OT Acute  Rehab Services Office: [phone] 05/01/2023   Theodoro Clock 05/01/2023, 1:06 PM

## 2023-05-01 NOTE — Progress Notes (Signed)
Discharge instructions given to patient and husband D Blessing Ozga RN 

## 2023-05-01 NOTE — Discharge Summary (Signed)
 PATIENT ID:      Gabriella Farrell  MRN:     [id number] DOB/AGE:    09-15-1955 / 68 y.o.     DISCHARGE SUMMARY  ADMISSION DATE:    04/30/2023 DISCHARGE DATE:  05/01/2022  ADMISSION DIAGNOSIS: POST TRAUMATIC ARTHRITIS Past Medical History:  Diagnosis Date   Anxiety    Arthritis    Cancer (HCC)    lesion on right side of neck   Chronic kidney disease    COPD (chronic obstructive pulmonary disease) (HCC)    Depression    Discoid lupus erythematosus    GERD (gastroesophageal reflux disease)    H/O chronic pancreatitis    H/O ETOH abuse    Hypertension    Multiple pulmonary nodules    Stable per Pulmonology   SIADH (syndrome of inappropriate ADH production) (HCC)    Sleep apnea    no CPAP   Smoker     DISCHARGE DIAGNOSIS:   Principal Problem:   S/P reverse total shoulder arthroplasty, left   PROCEDURE: Procedure(s): REMOVAL, HARDWARE ARTHROPLASTY, SHOULDER, TOTAL, REVERSE on 04/30/2023  CONSULTS:    HISTORY:  See H&P in chart.  HOSPITAL COURSE:  Gabriella Farrell is a 68 y.o. admitted on 04/30/2023 with a diagnosis of POST TRAUMATIC ARTHRITIS.  They were brought to the operating room on 04/30/2023 and underwent Procedure(s): REMOVAL, HARDWARE ARTHROPLASTY, SHOULDER, TOTAL, REVERSE.    They were given perioperative antibiotics:  Anti-infectives (From admission, onward)    Start     Dose/Rate Route Frequency Ordered Stop   04/30/23 1034  vancomycin (VANCOCIN) powder  Status:  Discontinued          As needed 04/30/23 1035 04/30/23 1645   04/30/23 0745  ceFAZolin (ANCEF) IVPB 2g/100 mL premix        2 g 200 mL/hr over 30 Minutes Intravenous On call to O.R. 04/30/23 8295 04/30/23 1012     .  Patient underwent the above named procedure and tolerated it well.  She was kept overnight due to concern for her oxygenation.  She does have chronic COPD and is a multiple pack-year smoker and is followed by pulmonologist.  She was kept and maintained on oxygen overnight.  The following  morning when I saw her she was 90% on room air.  The following day they were hemodynamically stable and pain was controlled on oral analgesics. They were neurovascularly intact to the operative extremity. OT was ordered and worked with patient per protocol. They were medically and orthopaedically stable for discharge on 05/01/23.    DIAGNOSTIC STUDIES:  RECENT RADIOGRAPHIC STUDIES :  X-ray chest PA or AP Result Date: 04/30/2023 CLINICAL DATA:  Status post shoulder arthroplasty EXAM: CHEST  1 VIEW COMPARISON:  Chest x-ray 04/06/2023 FINDINGS: There is a new left shoulder arthroplasty in place. There is surrounding soft tissue air compatible with recent surgery. The lungs are clear. There is no pleural effusion or pneumothorax. No acute fractures are seen. The cardiomediastinal silhouette is within normal limits. IMPRESSION: 1. New left shoulder arthroplasty in place with surrounding soft tissue air compatible with recent surgery. 2. No acute cardiopulmonary process. Electronically Signed   By: Darliss Cheney M.D.   On: 04/30/2023 19:36   DG Chest 2 View Result Date: 04/18/2023 CLINICAL DATA:  Preop. EXAM: CHEST - 2 VIEW COMPARISON:  Radiograph 12/29/2022 FINDINGS: The cardiomediastinal contours are normal. Chronic hyperinflation. Previous left upper lobe nodule is not well seen on the current exam. Pulmonary vasculature is normal. No consolidation, pleural effusion, or pneumothorax.  Surgical hardware in the left proximal humerus. Chronic deformity of the right proximal humerus. IMPRESSION: Chronic hyperinflation. No acute chest findings. Electronically Signed   By: Narda Rutherford M.D.   On: 04/18/2023 16:02    RECENT VITAL SIGNS:  Patient Vitals for the past 24 hrs:  BP Temp Temp src Pulse Resp SpO2  05/01/23 0436 135/67 97.8 F (36.6 C) Oral 72 16 94 %  05/01/23 0123 119/69 97.6 F (36.4 C) Oral 73 15 95 %  04/30/23 2102 123/65 97.9 F (36.6 C) Oral 79 16 98 %  04/30/23 1652 (!) 143/72 (!) 97.5  F (36.4 C) Oral 76 18 98 %  04/30/23 1630 118/70 -- -- 71 18 90 %  04/30/23 1615 124/71 -- -- 72 13 92 %  04/30/23 1600 128/67 -- -- 78 16 91 %  04/30/23 1545 130/69 -- -- 76 12 100 %  04/30/23 1530 124/73 -- -- 85 18 93 %  04/30/23 1515 133/69 -- -- 73 11 95 %  04/30/23 1500 (!) 141/72 -- -- 81 15 93 %  04/30/23 1445 132/66 -- -- 80 15 (!) 89 %  04/30/23 1430 133/72 -- -- 86 17 91 %  04/30/23 1400 129/66 -- -- 69 13 90 %  04/30/23 1345 133/72 (!) 97.5 F (36.4 C) -- 75 11 92 %  04/30/23 1337 -- -- -- 78 (!) 9 93 %  04/30/23 1330 130/70 -- -- 76 15 94 %  04/30/23 1315 134/67 -- -- 80 15 (!) 87 %  04/30/23 1300 (!) 142/72 -- -- 92 (!) 21 (!) 88 %  04/30/23 1245 (!) 146/75 -- -- 96 (!) 23 98 %  04/30/23 1239 (!) 155/75 (!) 97 F (36.1 C) -- 98 (!) 22 100 %  04/30/23 0915 (!) 145/68 -- -- 79 (!) 21 96 %  04/30/23 0910 (!) 143/76 -- -- 85 (!) 21 96 %  04/30/23 0905 (!) 143/82 -- -- 89 (!) 22 95 %  04/30/23 0900 (!) 142/70 -- -- 80 20 96 %  04/30/23 0855 (!) 143/68 -- -- 79 20 96 %  04/30/23 0850 (!) 142/69 -- -- 79 18 96 %  04/30/23 0845 (!) 147/73 -- -- 83 18 97 %  04/30/23 0840 (!) 145/71 -- -- 83 18 97 %  04/30/23 0835 (!) 145/71 -- -- 84 (!) 23 97 %  04/30/23 0830 138/67 -- -- 82 15 97 %  04/30/23 0825 (!) 148/68 -- -- 91 18 95 %  04/30/23 0750 (!) 143/65 -- -- 88 (!) 23 96 %  04/30/23 0748 (!) 143/65 97.7 F (36.5 C) Oral 91 (!) 26 97 %  .  RECENT EKG RESULTS:    Orders placed or performed during the hospital encounter of 04/28/23   EKG 12 lead per protocol   EKG 12 lead per protocol    DISCHARGE INSTRUCTIONS:    DISCHARGE MEDICATIONS:   Allergies as of 05/01/2023       Reactions   Bee Venom Anaphylaxis        Medication List     TAKE these medications    albuterol 108 (90 Base) MCG/ACT inhaler Commonly known as: VENTOLIN HFA Inhale 2 puffs into the lungs every 6 (six) hours as needed for wheezing or shortness of breath.   ALPRAZolam 0.5 MG  tablet Commonly known as: XANAX Take 2 mg by mouth every 6 (six) hours as needed for anxiety.   amLODipine 5 MG tablet Commonly known as: NORVASC Take 1 tablet by mouth  once daily   Armodafinil 200 MG Tabs Take 100 mg by mouth daily.   atorvastatin 20 MG tablet Commonly known as: LIPITOR Take 1 tablet by mouth once daily   budesonide-formoterol 160-4.5 MCG/ACT inhaler Commonly known as: Symbicort Inhale 2 puffs into the lungs in the morning and at bedtime.   CALCIUM 600 + D PO Take 1 tablet by mouth daily. With vitamin c   cyclobenzaprine 10 MG tablet Commonly known as: FLEXERIL Take 1 tablet (10 mg total) by mouth 3 (three) times daily as needed for muscle spasms.   famotidine 20 MG tablet Commonly known as: PEPCID Take 20 mg by mouth daily as needed for heartburn or indigestion.   gabapentin 100 MG capsule Commonly known as: NEURONTIN Take 100 mg by mouth 3 (three) times daily.   Incruse Ellipta 62.5 MCG/ACT Aepb Generic drug: umeclidinium bromide Inhale 1 puff into the lungs daily.   multivitamin capsule Take 1 capsule by mouth daily.   naproxen 500 MG tablet Commonly known as: Naprosyn Take 1 tablet (500 mg total) by mouth 2 (two) times daily with a meal.   ondansetron 4 MG tablet Commonly known as: Zofran Take 1 tablet (4 mg total) by mouth every 8 (eight) hours as needed for nausea or vomiting.   oxyCODONE-acetaminophen 5-325 MG tablet Commonly known as: Percocet Take 1 tablet by mouth every 4 (four) hours as needed (max 6 q).   pantoprazole 40 MG tablet Commonly known as: PROTONIX Take 40 mg by mouth daily.   Pristiq 25 MG 24 hr tablet Generic drug: desvenlafaxine Take 25 mg by mouth daily.   sodium chloride 1 g tablet Take 1-2 g by mouth See admin instructions. Take 2 g in the morning, 1 g in the afternoon, and 1 g in the evening        FOLLOW UP VISIT:    Follow-up Information     Francena Hanly, MD Follow up.   Specialty: Orthopedic  Surgery Why: on 05/13/23 at 3:00 Contact information: [address] [place] [address] [phone]                 DISCHARGE WU:XLKG   DISCHARGE CONDITION:  Stable  She was instructed to use all of her pulmonary medicines and inhalers on a scheduled basis through the weekend.  She was also taken her incentive spirometer at home.  She does have a pulmonologist and if she is continuing to run lower saturation I have instructed her Monday morning to reach out to her pulmonologist for additional instructions on modification of her breathing medications.  Otherwise she was stable for discharge.     French Ana Izzabella Besse for Dr. Francena Hanly 05/01/2023, 7:45 AM

## 2023-05-01 NOTE — Care Plan (Signed)
 Ortho Bundle Case Management Note  Patient Details  Name: Gabriella Farrell MRN: [id number] Date of Birth: 09-Feb-1955  Lt Reverse TSA on 04/30/23  DCP: Home with husband DME: No needs PT: [facility] 4/28                DME Arranged:  N/A DME Agency:  NA  HH Arranged:    HH Agency:     Additional Comments: Please contact me with any questions of if this plan should need to change.  Aida Raider, Case Manager [facility] [phone]   05/01/2023, 8:26 AM

## 2023-05-04 ENCOUNTER — Telehealth (HOSPITAL_BASED_OUTPATIENT_CLINIC_OR_DEPARTMENT_OTHER): Payer: Self-pay | Admitting: Emergency Medicine

## 2023-05-04 DIAGNOSIS — R918 Other nonspecific abnormal finding of lung field: Secondary | ICD-10-CM

## 2023-05-05 NOTE — Telephone Encounter (Signed)
 Spoke with Patient. She called [facility] and was informed that she should cancel the appointment on 05/06/23 due to not being able to put her arms over her head. Patient would like to have someone in clinical speak with her for guidance on the next steps and if she will able to proceed with the PFT on 05/12/23.

## 2023-05-05 NOTE — Telephone Encounter (Signed)
 She should still get the PFT We may need to think of some mechanism for her to have her follow-up CT even if she cannot put her arms above her head.  Can discuss with radiology to see what the options are

## 2023-05-05 NOTE — Telephone Encounter (Signed)
 Spoke with Darl Pikes, informed pt of RB note. She is going to keep PFT appt as scheduled. Pt verbalized understanding and had no concerns.   Spoke with Radiology tech he informed me that if pt can raise one arm then they will be able to continue with the CT scan. Verified with patient what was said from radiology & patient is fine with doing ct scan. Pt has cancelled ct scan already. Please advise if you would like pt to be scheduled for ct scan before her upcoming appt 4/30

## 2023-05-05 NOTE — Telephone Encounter (Signed)
 Pt is unable to do CT scan due to not being able to put her hands above head due to having shoulder surgery. Pt has upcoming pft (4/8) Florentina Addison is out of office and not avail. Pt is scheduled for upcoming ov with RB to review results. Dr. Delton Coombes can you advise if you would like pt to still have the PFT?

## 2023-05-06 ENCOUNTER — Other Ambulatory Visit

## 2023-05-06 NOTE — Telephone Encounter (Signed)
 Called and spoke with Darl Pikes regarding RB note. Pt verbalized agreement to having CT scan done & had no concerns. CT order has been placed. NFN

## 2023-05-06 NOTE — Telephone Encounter (Signed)
 I believe it would be good to get it done

## 2023-05-07 ENCOUNTER — Ambulatory Visit
Admission: RE | Admit: 2023-05-07 | Discharge: 2023-05-07 | Disposition: A | Discharge status: Home / Self Care | Source: Ambulatory Visit | Attending: Emergency Medicine | Admitting: Emergency Medicine

## 2023-05-07 DIAGNOSIS — R918 Other nonspecific abnormal finding of lung field: Secondary | ICD-10-CM | POA: Diagnosis not present

## 2023-05-07 DIAGNOSIS — I2510 Atherosclerotic heart disease of native coronary artery without angina pectoris: Secondary | ICD-10-CM | POA: Diagnosis not present

## 2023-05-07 DIAGNOSIS — J439 Emphysema, unspecified: Secondary | ICD-10-CM | POA: Diagnosis not present

## 2023-05-07 DIAGNOSIS — I700 Atherosclerosis of aorta: Secondary | ICD-10-CM | POA: Diagnosis not present

## 2023-05-07 MED ORDER — BUDESONIDE-FORMOTEROL FUMARATE 160-4.5 MCG/ACT IN AERO: 2.0000 | INHALATION_SPRAY | Freq: Two times a day (BID) | RESPIRATORY_TRACT | 3 refills | Status: DC

## 2023-05-07 MED ORDER — ANORO ELLIPTA 62.5-25 MCG/ACT IN AEPB: 1.0000 | INHALATION_SPRAY | Freq: Every day | RESPIRATORY_TRACT | 3 refills | Status: DC

## 2023-05-07 MED ORDER — INCRUSE ELLIPTA 62.5 MCG/ACT IN AEPB: 1.0000 | INHALATION_SPRAY | Freq: Every day | RESPIRATORY_TRACT | 3 refills | Status: DC

## 2023-05-07 NOTE — Telephone Encounter (Signed)
 Symbicort and Incruse that the patient was on was discontinued by mistake.  The order has been entered again and sent to the pharmacy.

## 2023-05-07 NOTE — Addendum Note (Signed)
 Addended byChilton Greathouse on: 05/07/2023 08:37 AM   Modules accepted: Orders

## 2023-05-12 ENCOUNTER — Ambulatory Visit (INDEPENDENT_AMBULATORY_CARE_PROVIDER_SITE_OTHER): Admitting: Emergency Medicine

## 2023-05-12 DIAGNOSIS — J432 Centrilobular emphysema: Secondary | ICD-10-CM

## 2023-05-12 LAB — PULMONARY FUNCTION TEST
DL/VA % pred: 56 %
DL/VA: 2.33 ml/min/mmHg/L
DLCO cor % pred: 41 %
DLCO cor: 8.76 ml/min/mmHg
DLCO unc % pred: 41 %
DLCO unc: 8.76 ml/min/mmHg
FEF 25-75 Post: 1.03 L/s
FEF 25-75 Pre: 0.63 L/s
FEF2575-%Change-Post: 64 %
FEF2575-%Pred-Post: 47 %
FEF2575-%Pred-Pre: 29 %
FEV1-%Change-Post: 13 %
FEV1-%Pred-Post: 60 %
FEV1-%Pred-Pre: 52 %
FEV1-Post: 1.54 L
FEV1-Pre: 1.35 L
FEV1FVC-%Change-Post: -1 %
FEV1FVC-%Pred-Pre: 79 %
FEV6-%Change-Post: 14 %
FEV6-%Pred-Post: 77 %
FEV6-%Pred-Pre: 67 %
FEV6-Post: 2.5 L
FEV6-Pre: 2.18 L
FEV6FVC-%Change-Post: 0 %
FEV6FVC-%Pred-Post: 102 %
FEV6FVC-%Pred-Pre: 103 %
FVC-%Change-Post: 15 %
FVC-%Pred-Post: 76 %
FVC-%Pred-Pre: 65 %
FVC-Post: 2.55 L
FVC-Pre: 2.21 L
Post FEV1/FVC ratio: 60 %
Post FEV6/FVC ratio: 98 %
Pre FEV1/FVC ratio: 61 %
Pre FEV6/FVC Ratio: 99 %
RV % pred: 143 %
RV: 3.19 L
TLC % pred: 101 %
TLC: 5.45 L

## 2023-05-12 NOTE — Patient Instructions (Signed)
 Full PFT Performed Today

## 2023-05-12 NOTE — Progress Notes (Signed)
 Full PFT Performed Today

## 2023-05-13 DIAGNOSIS — E559 Vitamin D deficiency, unspecified: Secondary | ICD-10-CM | POA: Diagnosis not present

## 2023-05-13 DIAGNOSIS — Z96612 Presence of left artificial shoulder joint: Secondary | ICD-10-CM | POA: Diagnosis not present

## 2023-05-13 DIAGNOSIS — E041 Nontoxic single thyroid nodule: Secondary | ICD-10-CM | POA: Diagnosis not present

## 2023-05-13 DIAGNOSIS — E7800 Pure hypercholesterolemia, unspecified: Secondary | ICD-10-CM | POA: Diagnosis not present

## 2023-05-13 DIAGNOSIS — Z4731 Aftercare following explantation of shoulder joint prosthesis: Secondary | ICD-10-CM | POA: Diagnosis not present

## 2023-05-15 DIAGNOSIS — J432 Centrilobular emphysema: Secondary | ICD-10-CM | POA: Diagnosis not present

## 2023-05-15 DIAGNOSIS — Z0000 Encounter for general adult medical examination without abnormal findings: Secondary | ICD-10-CM | POA: Diagnosis not present

## 2023-05-15 DIAGNOSIS — M8580 Other specified disorders of bone density and structure, unspecified site: Secondary | ICD-10-CM | POA: Diagnosis not present

## 2023-05-15 DIAGNOSIS — E559 Vitamin D deficiency, unspecified: Secondary | ICD-10-CM | POA: Diagnosis not present

## 2023-05-15 DIAGNOSIS — Z681 Body mass index (BMI) 19 or less, adult: Secondary | ICD-10-CM | POA: Diagnosis not present

## 2023-05-15 DIAGNOSIS — Z780 Asymptomatic menopausal state: Secondary | ICD-10-CM | POA: Diagnosis not present

## 2023-05-15 DIAGNOSIS — K860 Alcohol-induced chronic pancreatitis: Secondary | ICD-10-CM | POA: Diagnosis not present

## 2023-05-15 DIAGNOSIS — I10 Essential (primary) hypertension: Secondary | ICD-10-CM | POA: Diagnosis not present

## 2023-05-15 DIAGNOSIS — F411 Generalized anxiety disorder: Secondary | ICD-10-CM | POA: Diagnosis not present

## 2023-05-15 DIAGNOSIS — E7800 Pure hypercholesterolemia, unspecified: Secondary | ICD-10-CM | POA: Diagnosis not present

## 2023-05-15 DIAGNOSIS — E041 Nontoxic single thyroid nodule: Secondary | ICD-10-CM | POA: Diagnosis not present

## 2023-06-01 ENCOUNTER — Other Ambulatory Visit: Payer: Self-pay

## 2023-06-01 ENCOUNTER — Ambulatory Visit: Attending: Orthopedic Surgery

## 2023-06-01 DIAGNOSIS — R29898 Other symptoms and signs involving the musculoskeletal system: Secondary | ICD-10-CM | POA: Insufficient documentation

## 2023-06-01 DIAGNOSIS — M25612 Stiffness of left shoulder, not elsewhere classified: Secondary | ICD-10-CM | POA: Diagnosis not present

## 2023-06-01 DIAGNOSIS — M25512 Pain in left shoulder: Secondary | ICD-10-CM | POA: Diagnosis not present
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
## 2023-06-03 ENCOUNTER — Ambulatory Visit: Admitting: Emergency Medicine

## 2023-06-03 ENCOUNTER — Encounter: Payer: Self-pay | Admitting: Emergency Medicine

## 2023-06-03 VITALS — BP 116/67 | HR 96 | Ht 66.0 in | Wt 110.4 lb

## 2023-06-03 DIAGNOSIS — R918 Other nonspecific abnormal finding of lung field: Secondary | ICD-10-CM | POA: Diagnosis not present

## 2023-06-03 DIAGNOSIS — F17210 Nicotine dependence, cigarettes, uncomplicated: Secondary | ICD-10-CM

## 2023-06-03 DIAGNOSIS — J432 Centrilobular emphysema: Secondary | ICD-10-CM | POA: Diagnosis not present

## 2023-06-03 DIAGNOSIS — G4719 Other hypersomnia: Secondary | ICD-10-CM | POA: Diagnosis not present

## 2023-06-03 DIAGNOSIS — F17200 Nicotine dependence, unspecified, uncomplicated: Secondary | ICD-10-CM

## 2023-06-03 NOTE — Assessment & Plan Note (Signed)
 Continues smoke.  Discussed the benefits of cessation with her today.

## 2023-06-03 NOTE — Assessment & Plan Note (Signed)
 Index nodule in the left upper lobe is stable however there are several new pulmonary nodules that will need to be followed as well.  Largest is a 9 mm anterior right upper lobe groundglass nodule.  Plan to repeat her CT scan of the chest at the 86-month mark.  Depending on that result we will decide whether diagnostics including bronchoscopy are needed.

## 2023-06-03 NOTE — Assessment & Plan Note (Signed)
 She is on generic Symbicort  (Breyna ) and Incruse.  Plan to continue this regimen

## 2023-06-03 NOTE — Patient Instructions (Signed)
 We reviewed your CT scan of the chest today. We will plan to repeat your CT chest in October 2025. Please continue your Breyna  twice a day.  Rinse and gargle after using. Continue Incruse once daily. Keep albuterol  available to use 2 puffs or 1 nebulizer treatment up to every 4 hours if needed for shortness of breath, chest tightness, wheezing.  Try to work on decreasing your cigarettes Follow Dr. Baldwin Levee in October to review your scan.  Please call sooner if you have any problems.

## 2023-06-03 NOTE — Assessment & Plan Note (Signed)
 Diagnosis of OSA, not on CPAP, intolerant.

## 2023-06-03 NOTE — Progress Notes (Signed)
 Subjective:    Patient ID: Gabriella Farrell, female    DOB: 03-Aug-1955, 68 y.o.   MRN: [id number]  HPI 68 year old active smoker (50 pack years) who has been followed in our office by Dr. Thelda Finney for COPD, OSA not on CPAP, pulmonary nodular disease.  Last seen in our office March 2025 for preoperative evaluation in preparation for shoulder surgery.  She had a surveillance CT scan of her chest done on 05/07/2023 Currently managed on Symbicort  and Incruse, uses albuterol  occasionally.  Deals with some stable exertional dyspnea, stable cough and minimal sputum production.    CT chest/3/25 reviewed by me, shows no mediastinal or hilar adenopathy, severe changes of emphysema with diffuse bronchial wall thickening, unchanged 5 mm groundglass right lower lobe nodule, unchanged 8 mm left upper lobe nodule, several new lung nodules: Anterior right upper lobe groundglass nodule 9 mm, 3 mm left upper lobe nodule, 4 mm right upper lobe nodule, 4 mm anterior medial right upper lobe nodule.  Review of Systems As per HPI  Past Medical History:  Diagnosis Date   Anxiety    Arthritis    Cancer (HCC)    lesion on right side of neck   Chronic kidney disease    COPD (chronic obstructive pulmonary disease) (HCC)    Depression    Discoid lupus erythematosus    GERD (gastroesophageal reflux disease)    H/O chronic pancreatitis    H/O ETOH abuse    Hypertension    Multiple pulmonary nodules    Stable per Pulmonology   SIADH (syndrome of inappropriate ADH production) (HCC)    Sleep apnea    no CPAP   Smoker      Family History  Problem Relation Age of Onset   Cancer Mother    Heart attack Mother 28   Heart disease Sister    Cancer - Lung Brother    Melanoma Sister      Social History   Socioeconomic History   Marital status: Married    Spouse name: Not on file   Number of children: 2   Years of education: Not on file   Highest education level: Not on file  Occupational History   Not on  file  Tobacco Use   Smoking status: Every Day    Current packs/day: 1.00    Average packs/day: 1 pack/day for 49.3 years (49.3 ttl pk-yrs)    Types: Cigarettes    Start date: 1976   Smokeless tobacco: Never   Tobacco comments:    1 pk daily 06/03/23  Vaping Use   Vaping status: Never Used  Substance and Sexual Activity   Alcohol use: Yes    Alcohol/week: 15.0 standard drinks of alcohol    Types: 1 Glasses of wine, 14 Standard drinks or equivalent per week    Comment: daily   Drug use: No   Sexual activity: Not Currently  Other Topics Concern   Not on file  Social History Narrative   Not on file   Social Drivers of Health   Financial Resource Strain: Not on file  Food Insecurity: No Food Insecurity (04/30/2023)   Hunger Vital Sign    Worried About Running Out of Food in the Last Year: Never true    Ran Out of Food in the Last Year: Never true  Transportation Needs: No Transportation Needs (04/30/2023)   PRAPARE - Administrator, Civil Service (Medical): No    Lack of Transportation (Non-Medical): No  Physical Activity: Not on  file  Stress: Not on file  Social Connections: Socially Integrated (04/30/2023)   Social Connection and Isolation Panel [NHANES]    Frequency of Communication with Friends and Family: More than three times a week    Frequency of Social Gatherings with Friends and Family: More than three times a week    Attends Religious Services: More than 4 times per year    Active Member of [employer] or Organizations: Yes    Attends Engineer, structural: More than 4 times per year    Marital Status: Married  Catering manager Violence: Not At Risk (04/30/2023)   Humiliation, Afraid, Rape, and Kick questionnaire    Fear of Current or Ex-Partner: No    Emotionally Abused: No    Physically Abused: No    Sexually Abused: No     Allergies  Allergen Reactions   Bee Venom Anaphylaxis     Outpatient Medications Prior to Visit  Medication Sig Dispense  Refill   albuterol  (VENTOLIN  HFA) 108 (90 Base) MCG/ACT inhaler Inhale 2 puffs into the lungs every 6 (six) hours as needed for wheezing or shortness of breath. 8 g 2   ALPRAZolam  (XANAX ) 0.5 MG tablet Take 2 mg by mouth every 6 (six) hours as needed for anxiety.     amLODipine  (NORVASC ) 5 MG tablet Take 1 tablet by mouth once daily 90 tablet 0   Armodafinil  200 MG TABS Take 100 mg by mouth daily.     atorvastatin  (LIPITOR) 20 MG tablet Take 1 tablet by mouth once daily 90 tablet 0   budesonide -formoterol  (SYMBICORT ) 160-4.5 MCG/ACT inhaler Inhale 2 puffs into the lungs 2 times daily at 12 noon and 4 pm. 3 each 3   Calcium  Carb-Cholecalciferol (CALCIUM  600 + D PO) Take 1 tablet by mouth daily. With vitamin c     cyclobenzaprine  (FLEXERIL ) 10 MG tablet Take 1 tablet (10 mg total) by mouth 3 (three) times daily as needed for muscle spasms. 30 tablet 1   desvenlafaxine (PRISTIQ) 25 MG 24 hr tablet Take 25 mg by mouth daily.     famotidine  (PEPCID ) 20 MG tablet Take 20 mg by mouth daily as needed for heartburn or indigestion.     gabapentin  (NEURONTIN ) 100 MG capsule Take 100 mg by mouth 3 (three) times daily.     Multiple Vitamin (MULTIVITAMIN) capsule Take 1 capsule by mouth daily.     naproxen  (NAPROSYN ) 500 MG tablet Take 1 tablet (500 mg total) by mouth 2 (two) times daily with a meal. 60 tablet 1   ondansetron  (ZOFRAN ) 4 MG tablet Take 1 tablet (4 mg total) by mouth every 8 (eight) hours as needed for nausea or vomiting. 10 tablet 0   oxyCODONE -acetaminophen  (PERCOCET) 5-325 MG tablet Take 1 tablet by mouth every 4 (four) hours as needed (max 6 q). 20 tablet 0   pantoprazole  (PROTONIX ) 40 MG tablet Take 40 mg by mouth daily.     sodium chloride  1 g tablet Take 1-2 g by mouth See admin instructions. Take 2 g in the morning, 1 g in the afternoon, and 1 g in the evening     umeclidinium bromide  (INCRUSE ELLIPTA ) 62.5 MCG/ACT AEPB Inhale 1 puff into the lungs daily. 90 each 3   No  facility-administered medications prior to visit.         Objective:   Physical Exam Vitals:   06/03/23 1138  BP: 116/67  Pulse: 96  SpO2: 96%  Weight: 110 lb 6.4 oz (50.1 kg)  Height:  5\' 6"  (1.676 m)   Gen: Pleasant, well-nourished, in no distress,  normal affect  ENT: No lesions,  mouth clear,  oropharynx clear, no postnasal drip  Neck: No JVD, no stridor  Lungs: No use of accessory muscles, no crackles or wheezing on normal respiration, no wheeze on forced expiration  Cardiovascular: RRR, heart sounds normal, no murmur or gallops, no peripheral edema  Musculoskeletal: No deformities, no cyanosis or clubbing  Neuro: alert, awake, non focal  Skin: Warm, no lesions or rash        Assessment & Plan:  Lung nodules Index nodule in the left upper lobe is stable however there are several new pulmonary nodules that will need to be followed as well.  Largest is a 9 mm anterior right upper lobe groundglass nodule.  Plan to repeat her CT scan of the chest at the 73-month mark.  Depending on that result we will decide whether diagnostics including bronchoscopy are needed.  Centrilobular emphysema (HCC) She is on generic Symbicort  (Breyna ) and Incruse.  Plan to continue this regimen  Tobacco use disorder Continues smoke.  Discussed the benefits of cessation with her today.  Excessive daytime sleepiness Diagnosis of OSA, not on CPAP, intolerant.    Racheal Buddle, MD, PhD 06/03/2023, 12:57 PM [facility] [phone] or if no answer before 7:00PM call [phone] For any issues after 7:00PM please call eLink [phone]

## 2023-06-04 ENCOUNTER — Ambulatory Visit: Admitting: Physical Therapy

## 2023-06-08 ENCOUNTER — Encounter: Payer: Self-pay | Admitting: Physical Therapy

## 2023-06-08 ENCOUNTER — Ambulatory Visit: Attending: Orthopedic Surgery | Admitting: Physical Therapy

## 2023-06-08 DIAGNOSIS — M25512 Pain in left shoulder: Secondary | ICD-10-CM | POA: Insufficient documentation

## 2023-06-08 DIAGNOSIS — R29898 Other symptoms and signs involving the musculoskeletal system: Secondary | ICD-10-CM | POA: Insufficient documentation

## 2023-06-08 DIAGNOSIS — M25612 Stiffness of left shoulder, not elsewhere classified: Secondary | ICD-10-CM | POA: Diagnosis not present

## 2023-06-08 NOTE — Therapy (Signed)
 OUTPATIENT PHYSICAL THERAPY SHOULDER EVALUATION   Patient Name: Gabriella Farrell MRN: [id number] DOB:[birth year], 68 y.o., female Today's Date: 06/08/2023  END OF SESSION:  PT End of Session - 06/08/23 1300     Visit Number 2    Date for PT Re-Evaluation 08/24/23    PT Start Time 1300    PT Stop Time 1345    PT Time Calculation (min) 45 min    Activity Tolerance Patient tolerated treatment well    Behavior During Therapy WFL for tasks assessed/performed             Past Medical History:  Diagnosis Date   Anxiety    Arthritis    Cancer (HCC)    lesion on right side of neck   Chronic kidney disease    COPD (chronic obstructive pulmonary disease) (HCC)    Depression    Discoid lupus erythematosus    GERD (gastroesophageal reflux disease)    H/O chronic pancreatitis    H/O ETOH abuse    Hypertension    Multiple pulmonary nodules    Stable per Pulmonology   SIADH (syndrome of inappropriate ADH production) (HCC)    Sleep apnea    no CPAP   Smoker    Past Surgical History:  Procedure Laterality Date   BREAST EXCISIONAL BIOPSY Right    CHOLECYSTECTOMY  2010   open cholecystectomy and also repaired ducts in pancreas   COLONOSCOPY W/ BIOPSIES AND POLYPECTOMY     x 5   ESOPHAGOGASTRODUODENOSCOPY     x 3   EYE SURGERY Bilateral 2020   cataract extractons   HARDWARE REMOVAL Left 04/30/2023   Procedure: REMOVAL, HARDWARE;  Surgeon: Ellard Gunning, MD;  Location: [facility];  Service: Orthopedics;  Laterality: Left;   REVERSE SHOULDER ARTHROPLASTY Left 04/30/2023   Procedure: ARTHROPLASTY, SHOULDER, TOTAL, REVERSE;  Surgeon: Ellard Gunning, MD;  Location: [facility];  Service: Orthopedics;  Laterality: Left;   SHOULDER SURGERY Left 2016   for Fx Humerus and dislocation   WISDOM TOOTH EXTRACTION     Patient Active Problem List   Diagnosis Date Noted   S/P reverse total shoulder arthroplasty, left 04/30/2023   Centrilobular emphysema (HCC) 04/06/2023   Lung nodules 04/06/2023    Preoperative respiratory examination 04/06/2023   Tobacco use disorder 09/27/2018   Excessive daytime sleepiness 09/27/2018   Snoring 09/27/2018   Anxiety 12/04/2014   Depression 12/04/2014    PCP: Darnelle Elders, PA-C  REFERRING PROVIDER: Ellard Gunning, MD  REFERRING DIAG: s/p removal of hardware and reverse TSA  THERAPY DIAG:  Acute pain of left shoulder  Stiffness of left shoulder, not elsewhere classified  Weakness of left shoulder  Rationale for Evaluation and Treatment: Rehabilitation  ONSET DATE: 04/30/23  SUBJECTIVE:  SUBJECTIVE STATEMENT: "Not good, A lot of pain" Pain comes down her arm, tingling in first and second digit Hand dominance: Right  PERTINENT HISTORY: [name] nearly 8 years ago, had fracture L prox humerus, with ORIF, developed traumatic arthritis, underwent reverse TSA  PAIN:  Are you having pain? Yes: NPRS scale: 7/10 Pain location: L upper arm Pain description: deep pain,  Aggravating factors: lifting arm Relieving factors: ice, heat, meds, sling  PRECAUTIONS: Shoulder  RED FLAGS: None   WEIGHT BEARING RESTRICTIONS: Yes NWB L UE   FALLS:  Has patient fallen in last 6 months? No  LIVING ENVIRONMENT: Lives with: lives with their spouse Lives in: House/apartment Stairs: No Has following equipment at home:  sling   OCCUPATION: retired  PLOF: Independent  PATIENT GOALS:able to move and use L arm without pain  NEXT MD VISIT:   OBJECTIVE:  Note: Objective measures were completed at Evaluation unless otherwise noted.  DIAGNOSTIC FINDINGS:  na  PATIENT SURVEYS:  SPADI  Total Disability Score: 59 / 80 = 73.8 % Total SPADI Score: 105 / 130 = 80.8 %   COGNITION: Overall cognitive status: Within functional limits for tasks  assessed     SENSATION: Reports intermittent numbness and weakness L hand and wrist   POSTURE: Very lean female, non tension tremor noted Ue's and LEs  UPPER EXTREMITY ROM:   Passive ROM Right eval Left eval  Shoulder flexion  88  Shoulder extension  nt  Shoulder abduction  60  Shoulder adduction    Shoulder internal rotation  nt  Shoulder external rotation  18  Elbow flexion  wfl  Elbow extension  wfl  Wrist flexion    Wrist extension    Wrist ulnar deviation    Wrist radial deviation    Wrist pronation    Wrist supination    (Blank rows =wfl)  UPPER EXTREMITY MMT:  MMT Right eval Left eval  Shoulder flexion  nt  Shoulder extension  nt  Shoulder abduction  nt  Shoulder adduction  nt  Shoulder internal rotation  nt  Shoulder external rotation  nt  Middle trapezius    Lower trapezius    Elbow flexion  3-  Elbow extension  3-  Wrist flexion    Wrist extension    Wrist ulnar deviation    Wrist radial deviation    Wrist pronation    Wrist supination    Grip strength (lbs)    (Blank rows = not tested)  PALPATION:  Tender, edematous medial upper arm Incision ant upper humerus healing, raised no redness or warmth noted                                                                                                                             TREATMENT DATE:  06/08/23 PROM LUE AAROM in supine with dowel  Flexion, abduction  x10 each LUE isometrics x4 all directions 1# W[place] Bar flexion to 90* x10 Shoulder abd to 90*x10  5# triceps extensions 2x10 Yellow band shoulder ext to neutral 2x10  06/01/23 Eval,  Supine for stretching L shoulder flex, abd, ER to tolerance Inst in L table flexion slides Also inst in isometrics for L shoulder flex, abd   Adapted standing L shoulder abd    PATIENT EDUCATION: Education details: POC, goals Person educated: Patient and Spouse Education method: Explanation, Demonstration, Tactile cues, Verbal cues, and  Handouts Education comprehension: verbalized understanding, returned demonstration, and verbal cues required  HOME EXERCISE PROGRAM: Provided printed directions  ASSESSMENT:  CLINICAL IMPRESSION: Patient is a 68 y.o. female who was treated today by physical therapy due to recovery from L reverse TSA.  She is currently 5 weeks post of and this was her first therapy session after evaluation. She  presents with decreased L shoulder ROM, strength and function. Despite this being her first visit she was able to achieve the 4 week post of ROM requirements. All interventions completed well but with some weakness and limitations. Active abductions was the most limited.  She should benefit from skilled PT to address her recovery, she is well motivated and cooperative.   OBJECTIVE IMPAIRMENTS: decreased activity tolerance, decreased ROM, decreased strength, impaired UE functional use, postural dysfunction, and pain.   ACTIVITY LIMITATIONS: carrying, lifting, sleeping, transfers, bathing, dressing, self feeding, and reach over head  PARTICIPATION LIMITATIONS: meal prep, cleaning, laundry, driving, community activity, and yard work  PERSONAL FACTORS: Age, Behavior pattern, Fitness, Past/current experiences, Time since onset of injury/illness/exacerbation, and 1-2 comorbidities: h/o falls, emphysema  are also affecting patient's functional outcome.   REHAB POTENTIAL: Good  CLINICAL DECISION MAKING: Evolving/moderate complexity  EVALUATION COMPLEXITY: Moderate   GOALS: Goals reviewed with patient? Yes  SHORT TERM GOALS: Target date: 2 weeks 06/15/23  IHEP Baseline: Goal status: INITIAL   LONG TERM GOALS: Target date: 08/24/23, 12 weeks  Total Disability Score: 59 / 80 = 73.8 % Total SPADI Score: 105 / 130 = 80.8 %: Improve to 20 % DISABLITY OR LESS  Baseline:  Goal status: INITIAL  2.  Active ROM L shoulder elevation 130 or greater Baseline: na yet Goal status: INITIAL  3.  Strength  L shoulder flex, abd, IR 5/5, ER 4-/5 Baseline: nt Goal status: INITIAL   PLAN:  PT FREQUENCY: 2x/week  PT DURATION: 12 weeks  PLANNED INTERVENTIONS: 97110-Therapeutic exercises, 97530- Therapeutic activity, 97112- Neuromuscular re-education, 97535- Self Care, 16109- Manual therapy, and 97033- Ionotophoresis 4mg /ml Dexamethasone   PLAN FOR NEXT SESSION: continue with stretching PROM, manual techniques and modalities as needed to address pain management L shoulder   Ollen Beverage, PTA,  06/08/2023, 1:02 PM

## 2023-06-10 ENCOUNTER — Ambulatory Visit: Admitting: Physical Therapy

## 2023-06-15 ENCOUNTER — Ambulatory Visit: Admitting: Physical Therapy

## 2023-06-16 DIAGNOSIS — L930 Discoid lupus erythematosus: Secondary | ICD-10-CM | POA: Diagnosis not present

## 2023-06-17 ENCOUNTER — Other Ambulatory Visit: Payer: Self-pay

## 2023-06-17 ENCOUNTER — Ambulatory Visit

## 2023-06-17 DIAGNOSIS — R29898 Other symptoms and signs involving the musculoskeletal system: Secondary | ICD-10-CM | POA: Diagnosis not present

## 2023-06-17 DIAGNOSIS — M25612 Stiffness of left shoulder, not elsewhere classified: Secondary | ICD-10-CM

## 2023-06-17 DIAGNOSIS — M25512 Pain in left shoulder: Secondary | ICD-10-CM | POA: Diagnosis not present

## 2023-06-17 NOTE — Therapy (Signed)
 OUTPATIENT PHYSICAL THERAPY SHOULDER TREATMENT   Patient Name: Gabriella Farrell MRN: [id number] DOB:1955/11/02, 68 y.o., female Today's Date: 06/17/2023  END OF SESSION:  PT End of Session - 06/17/23 1304     Visit Number 3    Date for PT Re-Evaluation 08/24/23    Progress Note Due on Visit 10    PT Start Time 1302    PT Stop Time 1342    PT Time Calculation (min) 40 min    Activity Tolerance Patient tolerated treatment well    Behavior During Therapy WFL for tasks assessed/performed              Past Medical History:  Diagnosis Date   Anxiety    Arthritis    Cancer (HCC)    lesion on right side of neck   Chronic kidney disease    COPD (chronic obstructive pulmonary disease) (HCC)    Depression    Discoid lupus erythematosus    GERD (gastroesophageal reflux disease)    H/O chronic pancreatitis    H/O ETOH abuse    Hypertension    Multiple pulmonary nodules    Stable per Pulmonology   SIADH (syndrome of inappropriate ADH production) (HCC)    Sleep apnea    no CPAP   Smoker    Past Surgical History:  Procedure Laterality Date   BREAST EXCISIONAL BIOPSY Right    CHOLECYSTECTOMY  2010   open cholecystectomy and also repaired ducts in pancreas   COLONOSCOPY W/ BIOPSIES AND POLYPECTOMY     x 5   ESOPHAGOGASTRODUODENOSCOPY     x 3   EYE SURGERY Bilateral 2020   cataract extractons   HARDWARE REMOVAL Left 04/30/2023   Procedure: REMOVAL, HARDWARE;  Surgeon: Ellard Gunning, MD;  Location: [facility];  Service: Orthopedics;  Laterality: Left;   REVERSE SHOULDER ARTHROPLASTY Left 04/30/2023   Procedure: ARTHROPLASTY, SHOULDER, TOTAL, REVERSE;  Surgeon: Ellard Gunning, MD;  Location: [facility];  Service: Orthopedics;  Laterality: Left;   SHOULDER SURGERY Left 2016   for Fx Humerus and dislocation   WISDOM TOOTH EXTRACTION     Patient Active Problem List   Diagnosis Date Noted   S/P reverse total shoulder arthroplasty, left 04/30/2023   Centrilobular emphysema (HCC)  04/06/2023   Lung nodules 04/06/2023   Preoperative respiratory examination 04/06/2023   Tobacco use disorder 09/27/2018   Excessive daytime sleepiness 09/27/2018   Snoring 09/27/2018   Anxiety 12/04/2014   Depression 12/04/2014    PCP: Darnelle Elders, PA-C  REFERRING PROVIDER: Ellard Gunning, MD  REFERRING DIAG: s/p removal of hardware and reverse TSA  THERAPY DIAG:  Acute pain of left shoulder  Stiffness of left shoulder, not elsewhere classified  Weakness of left shoulder  Rationale for Evaluation and Treatment: Rehabilitation  ONSET DATE: 04/30/23  SUBJECTIVE:  SUBJECTIVE STATEMENT: Lot of soreness L uppper arm  Hand dominance: Right  PERTINENT HISTORY: Gabriella Farrell nearly 8 years ago, had fracture L prox humerus, with ORIF, developed traumatic arthritis, underwent reverse TSA  PAIN:  Are you having pain? Yes: NPRS scale: 7/10 Pain location: L upper arm Pain description: deep pain,  Aggravating factors: lifting arm Relieving factors: ice, heat, meds, sling  PRECAUTIONS: Shoulder  RED FLAGS: None   WEIGHT BEARING RESTRICTIONS: Yes NWB L UE   FALLS:  Has patient fallen in last 6 months? No  LIVING ENVIRONMENT: Lives with: lives with their spouse Lives in: House/apartment Stairs: No Has following equipment at home: sling   OCCUPATION: retired  PLOF: Independent  PATIENT GOALS:able to move and use L arm without pain  NEXT MD VISIT:   OBJECTIVE:  Note: Objective measures were completed at Evaluation unless otherwise noted.  DIAGNOSTIC FINDINGS:  na  PATIENT SURVEYS:  SPADI Total Disability Score: 59 / 80 = 73.8 % Total SPADI Score: 105 / 130 = 80.8 %   COGNITION: Overall cognitive status: Within functional limits for tasks assessed     SENSATION: Reports  intermittent numbness and weakness L hand and wrist   POSTURE: Very lean female, non tension tremor noted Ue's and LEs  UPPER EXTREMITY ROM:   Passive ROM Right eval Left eval  Shoulder flexion  88  Shoulder extension  nt  Shoulder abduction  60  Shoulder adduction    Shoulder internal rotation  nt  Shoulder external rotation  18  Elbow flexion  wfl  Elbow extension  wfl  Wrist flexion    Wrist extension    Wrist ulnar deviation    Wrist radial deviation    Wrist pronation    Wrist supination    (Blank rows =wfl)  UPPER EXTREMITY MMT:  MMT Right eval Left eval  Shoulder flexion  nt  Shoulder extension  nt  Shoulder abduction  nt  Shoulder adduction  nt  Shoulder internal rotation  nt  Shoulder external rotation  nt  Middle trapezius    Lower trapezius    Elbow flexion  3-  Elbow extension  3-  Wrist flexion    Wrist extension    Wrist ulnar deviation    Wrist radial deviation    Wrist pronation    Wrist supination    Grip strength (lbs)    (Blank rows = not tested)  PALPATION:  Tender, edematous medial upper arm Incision ant upper humerus healing, raised no redness or warmth noted                                                                                                                             TREATMENT DATE:  06/17/23: Supine for L shoulder AAROM all planes, intermittent retrograde massage L upper arm Supine manually resisted isometrics for L shoulder abd, add, IR, ER 5 sec holds 10 reps Seated shoulder pulley 3 min flex  Seated man resist shoulder flex  isometrics  15x Seated biceps curls 2# 15x Seated triceps ext manual resist 15x Standing abd swings with wand 15x Seated table slides into flex and scaption 15x Pendulum ex 10x   06/08/23 PROM LUE AAROM in supine with dowel  Flexion, abduction  x10 each LUE isometrics x4 all directions 1# W[place] Bar flexion to 90* x10 Shoulder abd to 90*x10 5# triceps extensions 2x10 Yellow band shoulder  ext to neutral 2x10  06/01/23 Eval,  Supine for stretching L shoulder flex, abd, ER to tolerance Inst in L table flexion slides Also inst in isometrics for L shoulder flex, abd   Adapted standing L shoulder abd    PATIENT EDUCATION: Education details: POC, goals Person educated: Patient and Spouse Education method: Explanation, Demonstration, Tactile cues, Verbal cues, and Handouts Education comprehension: verbalized understanding, returned demonstration, and verbal cues required  HOME EXERCISE PROGRAM: Provided printed directions  ASSESSMENT:  CLINICAL IMPRESSION: Patient is a 68 y.o. female who was treated today by physical therapy due to recovery from L reverse TSA.  She is currently 7weeks post of and this was her second therapy session after evaluation. She  presents with decreased L shoulder ROM, strength and function.  We progressed her ex to tolerance, following Dr Leandro Proffer protocol.  She should benefit from skilled PT to address her recovery, she is well motivated and cooperative.   OBJECTIVE IMPAIRMENTS: decreased activity tolerance, decreased ROM, decreased strength, impaired UE functional use, postural dysfunction, and pain.   ACTIVITY LIMITATIONS: carrying, lifting, sleeping, transfers, bathing, dressing, self feeding, and reach over head  PARTICIPATION LIMITATIONS: meal prep, cleaning, laundry, driving, community activity, and yard work  PERSONAL FACTORS: Age, Behavior pattern, Fitness, Past/current experiences, Time since onset of injury/illness/exacerbation, and 1-2 comorbidities: h/o falls, emphysema are also affecting patient's functional outcome.   REHAB POTENTIAL: Good  CLINICAL DECISION MAKING: Evolving/moderate complexity  EVALUATION COMPLEXITY: Moderate   GOALS: Goals reviewed with patient? Yes  SHORT TERM GOALS: Target date: 2 weeks 06/15/23  IHEP Baseline: Goal status: INITIAL   LONG TERM GOALS: Target date: 08/24/23, 12 weeks  Total Disability  Score: 59 / 80 = 73.8 % Total SPADI Score: 105 / 130 = 80.8 %: Improve to 20 % DISABLITY OR LESS  Baseline:  Goal status: INITIAL  2.  Active ROM L shoulder elevation 130 or greater Baseline: na yet Goal status: INITIAL  3.  Strength L shoulder flex, abd, IR 5/5, ER 4-/5 Baseline: nt Goal status: INITIAL   PLAN:  PT FREQUENCY: 2x/week  PT DURATION: 12 weeks  PLANNED INTERVENTIONS: 97110-Therapeutic exercises, 97530- Therapeutic activity, [id number]- Neuromuscular re-education, 97535- Self Care, [id number]- Manual therapy, and 97033- Ionotophoresis 4mg /ml Dexamethasone   PLAN FOR NEXT SESSION: continue with stretching PROM, manual techniques and modalities as needed to address pain management L shoulder   Treazure Nery L Eann Cleland, PT,  06/17/2023, 1:49 PM

## 2023-06-22 ENCOUNTER — Ambulatory Visit: Admitting: Physical Therapy

## 2023-06-22 ENCOUNTER — Encounter: Payer: Self-pay | Admitting: Physical Therapy

## 2023-06-22 DIAGNOSIS — R29898 Other symptoms and signs involving the musculoskeletal system: Secondary | ICD-10-CM | POA: Diagnosis not present

## 2023-06-22 DIAGNOSIS — M25512 Pain in left shoulder: Secondary | ICD-10-CM

## 2023-06-22 DIAGNOSIS — M25612 Stiffness of left shoulder, not elsewhere classified: Secondary | ICD-10-CM

## 2023-06-22 NOTE — Therapy (Signed)
 OUTPATIENT PHYSICAL THERAPY SHOULDER TREATMENT   Patient Name: Gabriella Farrell MRN: [id number] DOB:1955-08-03, 68 y.o., female Today's Date: 06/22/2023  END OF SESSION:  PT End of Session - 06/22/23 1259     Visit Number 4    Date for PT Re-Evaluation 08/24/23    PT Start Time 1259    PT Stop Time 1345    PT Time Calculation (min) 46 min    Activity Tolerance Patient tolerated treatment well    Behavior During Therapy WFL for tasks assessed/performed              Past Medical History:  Diagnosis Date   Anxiety    Arthritis    Cancer (HCC)    lesion on right side of neck   Chronic kidney disease    COPD (chronic obstructive pulmonary disease) (HCC)    Depression    Discoid lupus erythematosus    GERD (gastroesophageal reflux disease)    H/O chronic pancreatitis    H/O ETOH abuse    Hypertension    Multiple pulmonary nodules    Stable per Pulmonology   SIADH (syndrome of inappropriate ADH production) (HCC)    Sleep apnea    no CPAP   Smoker    Past Surgical History:  Procedure Laterality Date   BREAST EXCISIONAL BIOPSY Right    CHOLECYSTECTOMY  2010   open cholecystectomy and also repaired ducts in pancreas   COLONOSCOPY W/ BIOPSIES AND POLYPECTOMY     x 5   ESOPHAGOGASTRODUODENOSCOPY     x 3   EYE SURGERY Bilateral 2020   cataract extractons   HARDWARE REMOVAL Left 04/30/2023   Procedure: REMOVAL, HARDWARE;  Surgeon: Ellard Gunning, MD;  Location: [facility];  Service: Orthopedics;  Laterality: Left;   REVERSE SHOULDER ARTHROPLASTY Left 04/30/2023   Procedure: ARTHROPLASTY, SHOULDER, TOTAL, REVERSE;  Surgeon: Ellard Gunning, MD;  Location: [facility];  Service: Orthopedics;  Laterality: Left;   SHOULDER SURGERY Left 2016   for Fx Humerus and dislocation   WISDOM TOOTH EXTRACTION     Patient Active Problem List   Diagnosis Date Noted   S/P reverse total shoulder arthroplasty, left 04/30/2023   Centrilobular emphysema (HCC) 04/06/2023   Lung nodules 04/06/2023    Preoperative respiratory examination 04/06/2023   Tobacco use disorder 09/27/2018   Excessive daytime sleepiness 09/27/2018   Snoring 09/27/2018   Anxiety 12/04/2014   Depression 12/04/2014    PCP: Darnelle Elders, PA-C  REFERRING PROVIDER: Ellard Gunning, MD  REFERRING DIAG: s/p removal of hardware and reverse TSA  THERAPY DIAG:  Acute pain of left shoulder  Stiffness of left shoulder, not elsewhere classified  Weakness of left shoulder  Rationale for Evaluation and Treatment: Rehabilitation  ONSET DATE: 04/30/23  SUBJECTIVE:  SUBJECTIVE STATEMENT: Its moving pretty good Hand dominance: Right  PERTINENT HISTORY: [name] nearly 8 years ago, had fracture L prox humerus, with ORIF, developed traumatic arthritis, underwent reverse TSA  PAIN:  Are you having pain? Yes: NPRS scale: 5/10 Pain location: L upper arm Pain description: deep pain,  Aggravating factors: lifting arm Relieving factors: ice, heat, meds, sling  PRECAUTIONS: Shoulder  RED FLAGS: None   WEIGHT BEARING RESTRICTIONS: Yes NWB L UE   FALLS:  Has patient fallen in last 6 months? No  LIVING ENVIRONMENT: Lives with: lives with their spouse Lives in: House/apartment Stairs: No Has following equipment at home: sling   OCCUPATION: retired  PLOF: Independent  PATIENT GOALS:able to move and use L arm without pain  NEXT MD VISIT:   OBJECTIVE:  Note: Objective measures were completed at Evaluation unless otherwise noted.  DIAGNOSTIC FINDINGS:  na  PATIENT SURVEYS:  SPADI Total Disability Score: 59 / 80 = 73.8 % Total SPADI Score: 105 / 130 = 80.8 %   COGNITION: Overall cognitive status: Within functional limits for tasks assessed     SENSATION: Reports intermittent numbness and weakness L hand and wrist    POSTURE: Very lean female, non tension tremor noted Ue's and LEs  UPPER EXTREMITY ROM:   Passive ROM Right eval Left eval  Shoulder flexion  88  Shoulder extension  nt  Shoulder abduction  60  Shoulder adduction    Shoulder internal rotation  nt  Shoulder external rotation  18  Elbow flexion  wfl  Elbow extension  wfl  Wrist flexion    Wrist extension    Wrist ulnar deviation    Wrist radial deviation    Wrist pronation    Wrist supination    (Blank rows =wfl)  UPPER EXTREMITY MMT:  MMT Right eval Left eval  Shoulder flexion  nt  Shoulder extension  nt  Shoulder abduction  nt  Shoulder adduction  nt  Shoulder internal rotation  nt  Shoulder external rotation  nt  Middle trapezius    Lower trapezius    Elbow flexion  3-  Elbow extension  3-  Wrist flexion    Wrist extension    Wrist ulnar deviation    Wrist radial deviation    Wrist pronation    Wrist supination    Grip strength (lbs)    (Blank rows = not tested)  PALPATION:  Tender, edematous medial upper arm Incision ant upper humerus healing, raised no redness or warmth noted                                                                                                                             TREATMENT DATE:  06/22/23 UBE L1 x 2 min each AAROM Flex & Abd w/ dowl 2x10 Rows & Ext (to neutral) yellow 2x10 LUE ER/IR with arm to side 2x10 yellow  LUE PROM with end range holds  within protocol limitations   06/17/23: Supine for  L shoulder AAROM all planes, intermittent retrograde massage L upper arm Supine manually resisted isometrics for L shoulder abd, add, IR, ER 5 sec holds 10 reps Seated shoulder pulley 3 min flex  Seated man resist shoulder flex isometrics  15x Seated biceps curls 2# 15x Seated triceps ext manual resist 15x Standing abd swings with wand 15x Seated table slides into flex and scaption 15x Pendulum ex 10x   06/08/23 PROM LUE AAROM in supine with dowel  Flexion, abduction   x10 each LUE isometrics x4 all directions 1# [place] Bar flexion to 90* x10 Shoulder abd to 90*x10 5# triceps extensions 2x10 Yellow band shoulder ext to neutral 2x10  06/01/23 Eval,  Supine for stretching L shoulder flex, abd, ER to tolerance Inst in L table flexion slides Also inst in isometrics for L shoulder flex, abd   Adapted standing L shoulder abd    PATIENT EDUCATION: Education details: POC, goals Person educated: Patient and Spouse Education method: Explanation, Demonstration, Tactile cues, Verbal cues, and Handouts Education comprehension: verbalized understanding, returned demonstration, and verbal cues required  HOME EXERCISE PROGRAM: Provided printed directions  ASSESSMENT:  CLINICAL IMPRESSION: Patient is a 68 y.o. female who was treated today by physical therapy due to recovery from L reverse TSA.  She is currently 7weeks post of and this was her third therapy session after evaluation. She  presents with decreased L shoulder ROM, strength and function.  We progressed her ex to tolerance, following Dr Leandro Proffer protocol. L shoulder elevation and protraction compensation with osm movements.  She should benefit from skilled PT to address her recovery, she is well motivated and cooperative.   OBJECTIVE IMPAIRMENTS: decreased activity tolerance, decreased ROM, decreased strength, impaired UE functional use, postural dysfunction, and pain.   ACTIVITY LIMITATIONS: carrying, lifting, sleeping, transfers, bathing, dressing, self feeding, and reach over head  PARTICIPATION LIMITATIONS: meal prep, cleaning, laundry, driving, community activity, and yard work  PERSONAL FACTORS: Age, Behavior pattern, Fitness, Past/current experiences, Time since onset of injury/illness/exacerbation, and 1-2 comorbidities: h/o falls, emphysema are also affecting patient's functional outcome.   REHAB POTENTIAL: Good  CLINICAL DECISION MAKING: Evolving/moderate complexity  EVALUATION COMPLEXITY:  Moderate   GOALS: Goals reviewed with patient? Yes  SHORT TERM GOALS: Target date: 2 weeks 06/15/23  IHEP Baseline: Goal status: INITIAL   LONG TERM GOALS: Target date: 08/24/23, 12 weeks  Total Disability Score: 59 / 80 = 73.8 % Total SPADI Score: 105 / 130 = 80.8 %: Improve to 20 % DISABLITY OR LESS  Baseline:  Goal status: INITIAL  2.  Active ROM L shoulder elevation 130 or greater Baseline: na yet Goal status: INITIAL  3.  Strength L shoulder flex, abd, IR 5/5, ER 4-/5 Baseline: nt Goal status: INITIAL   PLAN:  PT FREQUENCY: 2x/week  PT DURATION: 12 weeks  PLANNED INTERVENTIONS: 97110-Therapeutic exercises, 97530- Therapeutic activity, 97112- Neuromuscular re-education, 97535- Self Care, [id number]- Manual therapy, and 97033- Ionotophoresis 4mg /ml Dexamethasone   PLAN FOR NEXT SESSION: continue with stretching PROM, manual techniques and modalities as needed to address pain management L shoulder   Ollen Beverage, PTA,  06/22/2023, 1:00 PM

## 2023-06-24 ENCOUNTER — Encounter: Payer: Self-pay | Admitting: Physical Therapy

## 2023-06-24 ENCOUNTER — Ambulatory Visit: Admitting: Physical Therapy

## 2023-06-24 DIAGNOSIS — M25512 Pain in left shoulder: Secondary | ICD-10-CM

## 2023-06-24 DIAGNOSIS — M25612 Stiffness of left shoulder, not elsewhere classified: Secondary | ICD-10-CM | POA: Diagnosis not present

## 2023-06-24 DIAGNOSIS — R29898 Other symptoms and signs involving the musculoskeletal system: Secondary | ICD-10-CM

## 2023-06-24 NOTE — Therapy (Signed)
 OUTPATIENT PHYSICAL THERAPY SHOULDER TREATMENT   Patient Name: Gabriella Farrell MRN: [id number] DOB:[birth year], 68 y.o., female Today's Date: 06/24/2023  END OF SESSION:  PT End of Session - 06/24/23 1301     Visit Number 5    Date for PT Re-Evaluation 08/24/23    PT Start Time 1300    PT Stop Time 1345    PT Time Calculation (min) 45 min    Activity Tolerance Patient tolerated treatment well    Behavior During Therapy WFL for tasks assessed/performed              Past Medical History:  Diagnosis Date   Anxiety    Arthritis    Cancer (HCC)    lesion on right side of neck   Chronic kidney disease    COPD (chronic obstructive pulmonary disease) (HCC)    Depression    Discoid lupus erythematosus    GERD (gastroesophageal reflux disease)    H/O chronic pancreatitis    H/O ETOH abuse    Hypertension    Multiple pulmonary nodules    Stable per Pulmonology   SIADH (syndrome of inappropriate ADH production) (HCC)    Sleep apnea    no CPAP   Smoker    Past Surgical History:  Procedure Laterality Date   BREAST EXCISIONAL BIOPSY Right    CHOLECYSTECTOMY  2010   open cholecystectomy and also repaired ducts in pancreas   COLONOSCOPY W/ BIOPSIES AND POLYPECTOMY     x 5   ESOPHAGOGASTRODUODENOSCOPY     x 3   EYE SURGERY Bilateral 2020   cataract extractons   HARDWARE REMOVAL Left 04/30/2023   Procedure: REMOVAL, HARDWARE;  Surgeon: Ellard Gunning, MD;  Location: [facility];  Service: Orthopedics;  Laterality: Left;   REVERSE SHOULDER ARTHROPLASTY Left 04/30/2023   Procedure: ARTHROPLASTY, SHOULDER, TOTAL, REVERSE;  Surgeon: Ellard Gunning, MD;  Location: [facility];  Service: Orthopedics;  Laterality: Left;   SHOULDER SURGERY Left 2016   for Fx Humerus and dislocation   WISDOM TOOTH EXTRACTION     Patient Active Problem List   Diagnosis Date Noted   S/P reverse total shoulder arthroplasty, left 04/30/2023   Centrilobular emphysema (HCC) 04/06/2023   Lung nodules 04/06/2023    Preoperative respiratory examination 04/06/2023   Tobacco use disorder 09/27/2018   Excessive daytime sleepiness 09/27/2018   Snoring 09/27/2018   Anxiety 12/04/2014   Depression 12/04/2014    PCP: Darnelle Elders, PA-C  REFERRING PROVIDER: Ellard Gunning, MD  REFERRING DIAG: s/p removal of hardware and reverse TSA  THERAPY DIAG:  Acute pain of left shoulder  Stiffness of left shoulder, not elsewhere classified  Weakness of left shoulder  Rationale for Evaluation and Treatment: Rehabilitation  ONSET DATE: 04/30/23  SUBJECTIVE:  SUBJECTIVE STATEMENT: Shoulder is all right, its doing its thing  Hand dominance: Right  PERTINENT HISTORY: [name] nearly 8 years ago, had fracture L prox humerus, with ORIF, developed traumatic arthritis, underwent reverse TSA  PAIN:  Are you having pain? Yes: NPRS scale: 2/10 Pain location: L upper arm Pain description: deep pain,  Aggravating factors: lifting arm Relieving factors: ice, heat, meds, sling  PRECAUTIONS: Shoulder  RED FLAGS: None   WEIGHT BEARING RESTRICTIONS: Yes NWB L UE   FALLS:  Has patient fallen in last 6 months? No  LIVING ENVIRONMENT: Lives with: lives with their spouse Lives in: House/apartment Stairs: No Has following equipment at home: sling   OCCUPATION: retired  PLOF: Independent  PATIENT GOALS:able to move and use L arm without pain  NEXT MD VISIT:   OBJECTIVE:  Note: Objective measures were completed at Evaluation unless otherwise noted.  DIAGNOSTIC FINDINGS:  na  PATIENT SURVEYS:  SPADI Total Disability Score: 59 / 80 = 73.8 % Total SPADI Score: 105 / 130 = 80.8 %   COGNITION: Overall cognitive status: Within functional limits for tasks assessed     SENSATION: Reports intermittent numbness and weakness L  hand and wrist   POSTURE: Very lean female, non tension tremor noted Ue's and LEs  UPPER EXTREMITY ROM:   Passive ROM Left eval Left 06/24/23 AROM  Shoulder flexion 88 86  Shoulder extension nt   Shoulder abduction 60 65  Shoulder adduction    Shoulder internal rotation nt   Shoulder external rotation 18   Elbow flexion wfl   Elbow extension wfl   Wrist flexion    Wrist extension    Wrist ulnar deviation    Wrist radial deviation    Wrist pronation    Wrist supination    (Blank rows =wfl)  UPPER EXTREMITY MMT:  MMT Right eval Left eval  Shoulder flexion  nt  Shoulder extension  nt  Shoulder abduction  nt  Shoulder adduction  nt  Shoulder internal rotation  nt  Shoulder external rotation  nt  Middle trapezius    Lower trapezius    Elbow flexion  3-  Elbow extension  3-  Wrist flexion    Wrist extension    Wrist ulnar deviation    Wrist radial deviation    Wrist pronation    Wrist supination    Grip strength (lbs)    (Blank rows = not tested)  PALPATION:  Tender, edematous medial upper arm Incision ant upper humerus healing, raised no redness or warmth noted                                                                                                                             TREATMENT DATE: 06/24/23 UBE L1 x 3 min each  AAROM Flex & Abd w/ dowl 2x10 Rows & Ext (to neutral) red 2x10 LUE ER/IR with arm to side 2x10 yellow  AROM shoulder abe 2x10 LUE PROM with end range  holds  within protocol limitations    06/22/23 UBE L1 x 2 min each AAROM Flex & Abd w/ dowl 2x10 Rows & Ext (to neutral) yellow 2x10 LUE ER/IR with arm to side 2x10 yellow  LUE PROM with end range holds  within protocol limitations   06/17/23: Supine for L shoulder AAROM all planes, intermittent retrograde massage L upper arm Supine manually resisted isometrics for L shoulder abd, add, IR, ER 5 sec holds 10 reps Seated shoulder pulley 3 min flex  Seated man resist shoulder flex  isometrics  15x Seated biceps curls 2# 15x Seated triceps ext manual resist 15x Standing abd swings with wand 15x Seated table slides into flex and scaption 15x Pendulum ex 10x   06/08/23 PROM LUE AAROM in supine with dowel  Flexion, abduction  x10 each LUE isometrics x4 all directions 1# [place] Bar flexion to 90* x10 Shoulder abd to 90*x10 5# triceps extensions 2x10 Yellow band shoulder ext to neutral 2x10  06/01/23 Eval,  Supine for stretching L shoulder flex, abd, ER to tolerance Inst in L table flexion slides Also inst in isometrics for L shoulder flex, abd   Adapted standing L shoulder abd    PATIENT EDUCATION: Education details: POC, goals Person educated: Patient and Spouse Education method: Explanation, Demonstration, Tactile cues, Verbal cues, and Handouts Education comprehension: verbalized understanding, returned demonstration, and verbal cues required  HOME EXERCISE PROGRAM: Provided printed directions  ASSESSMENT:  CLINICAL IMPRESSION: Patient is a 68 y.o. female who was treated today by physical therapy due to recovery from L reverse TSA.  She is currently 7 weeks post of and this was her fourth therapy session after evaluation. She  presents with decreased L shoulder ROM, strength and function.  Continued to progressed her ex to tolerance, following Dr Leandro Proffer protocol. LUE AROM ROM baseline established. L shoulder elevation and protraction compensation with osm movements.  She should benefit from skilled PT to address her recovery, she is well motivated and cooperative.   OBJECTIVE IMPAIRMENTS: decreased activity tolerance, decreased ROM, decreased strength, impaired UE functional use, postural dysfunction, and pain.   ACTIVITY LIMITATIONS: carrying, lifting, sleeping, transfers, bathing, dressing, self feeding, and reach over head  PARTICIPATION LIMITATIONS: meal prep, cleaning, laundry, driving, community activity, and yard work  PERSONAL FACTORS: Age,  Behavior pattern, Fitness, Past/current experiences, Time since onset of injury/illness/exacerbation, and 1-2 comorbidities: h/o falls, emphysema are also affecting patient's functional outcome.   REHAB POTENTIAL: Good  CLINICAL DECISION MAKING: Evolving/moderate complexity  EVALUATION COMPLEXITY: Moderate   GOALS: Goals reviewed with patient? Yes  SHORT TERM GOALS: Target date: 2 weeks 06/15/23  IHEP Baseline: Goal status: INITIAL   LONG TERM GOALS: Target date: 08/24/23, 12 weeks  Total Disability Score: 59 / 80 = 73.8 % Total SPADI Score: 105 / 130 = 80.8 %: Improve to 20 % DISABLITY OR LESS  Baseline:  Goal status: INITIAL  2.  Active ROM L shoulder elevation 130 or greater Baseline: na yet Goal status: INITIAL  3.  Strength L shoulder flex, abd, IR 5/5, ER 4-/5 Baseline: nt Goal status: INITIAL   PLAN:  PT FREQUENCY: 2x/week  PT DURATION: 12 weeks  PLANNED INTERVENTIONS: 97110-Therapeutic exercises, 97530- Therapeutic activity, 97112- Neuromuscular re-education, 97535- Self Care, [id number]- Manual therapy, and 97033- Ionotophoresis 4mg /ml Dexamethasone   PLAN FOR NEXT SESSION: continue with stretching PROM, manual techniques and modalities as needed to address pain management L shoulder   Ollen Beverage, PTA,  06/24/2023, 1:02 PM

## 2023-06-30 ENCOUNTER — Ambulatory Visit: Admitting: Physical Therapy

## 2023-07-01 DIAGNOSIS — D360 Benign neoplasm of lymph nodes: Secondary | ICD-10-CM | POA: Diagnosis not present

## 2023-07-01 DIAGNOSIS — N6331 Unspecified lump in axillary tail of the right breast: Secondary | ICD-10-CM | POA: Diagnosis not present

## 2023-07-01 DIAGNOSIS — R92323 Mammographic fibroglandular density, bilateral breasts: Secondary | ICD-10-CM | POA: Diagnosis not present

## 2023-07-01 DIAGNOSIS — M810 Age-related osteoporosis without current pathological fracture: Secondary | ICD-10-CM | POA: Diagnosis not present

## 2023-07-06 ENCOUNTER — Ambulatory Visit: Attending: Orthopedic Surgery | Admitting: Physical Therapy

## 2023-07-06 ENCOUNTER — Encounter: Payer: Self-pay | Admitting: Physical Therapy

## 2023-07-06 DIAGNOSIS — M25512 Pain in left shoulder: Secondary | ICD-10-CM | POA: Diagnosis not present

## 2023-07-06 DIAGNOSIS — R29898 Other symptoms and signs involving the musculoskeletal system: Secondary | ICD-10-CM | POA: Insufficient documentation

## 2023-07-06 DIAGNOSIS — M25612 Stiffness of left shoulder, not elsewhere classified: Secondary | ICD-10-CM | POA: Diagnosis not present

## 2023-07-06 NOTE — Therapy (Signed)
 OUTPATIENT PHYSICAL THERAPY SHOULDER TREATMENT   Patient Name: Gabriella Farrell MRN: [id number] DOB:20-Nov-1955, 68 y.o., female Today's Date: 07/06/2023  END OF SESSION:  PT End of Session - 07/06/23 1345     Visit Number 6    Date for PT Re-Evaluation 08/24/23    PT Start Time 1345    PT Stop Time 1430    PT Time Calculation (min) 45 min    Activity Tolerance Patient tolerated treatment well    Behavior During Therapy WFL for tasks assessed/performed              Past Medical History:  Diagnosis Date   Anxiety    Arthritis    Cancer (HCC)    lesion on right side of neck   Chronic kidney disease    COPD (chronic obstructive pulmonary disease) (HCC)    Depression    Discoid lupus erythematosus    GERD (gastroesophageal reflux disease)    H/O chronic pancreatitis    H/O ETOH abuse    Hypertension    Multiple pulmonary nodules    Stable per Pulmonology   SIADH (syndrome of inappropriate ADH production) (HCC)    Sleep apnea    no CPAP   Smoker    Past Surgical History:  Procedure Laterality Date   BREAST EXCISIONAL BIOPSY Right    CHOLECYSTECTOMY  2010   open cholecystectomy and also repaired ducts in pancreas   COLONOSCOPY W/ BIOPSIES AND POLYPECTOMY     x 5   ESOPHAGOGASTRODUODENOSCOPY     x 3   EYE SURGERY Bilateral 2020   cataract extractons   HARDWARE REMOVAL Left 04/30/2023   Procedure: REMOVAL, HARDWARE;  Surgeon: Ellard Gunning, MD;  Location: [facility];  Service: Orthopedics;  Laterality: Left;   REVERSE SHOULDER ARTHROPLASTY Left 04/30/2023   Procedure: ARTHROPLASTY, SHOULDER, TOTAL, REVERSE;  Surgeon: Ellard Gunning, MD;  Location: [facility];  Service: Orthopedics;  Laterality: Left;   SHOULDER SURGERY Left 2016   for Fx Humerus and dislocation   WISDOM TOOTH EXTRACTION     Patient Active Problem List   Diagnosis Date Noted   S/P reverse total shoulder arthroplasty, left 04/30/2023   Centrilobular emphysema (HCC) 04/06/2023   Lung nodules 04/06/2023    Preoperative respiratory examination 04/06/2023   Tobacco use disorder 09/27/2018   Excessive daytime sleepiness 09/27/2018   Snoring 09/27/2018   Anxiety 12/04/2014   Depression 12/04/2014    PCP: Darnelle Elders, PA-C  REFERRING PROVIDER: Ellard Gunning, MD  REFERRING DIAG: s/p removal of hardware and reverse TSA  THERAPY DIAG:  Acute pain of left shoulder  Stiffness of left shoulder, not elsewhere classified  Weakness of left shoulder  Rationale for Evaluation and Treatment: Rehabilitation  ONSET DATE: 04/30/23  SUBJECTIVE:  SUBJECTIVE STATEMENT: Tightness and throbbing in the front of L shoulder  Hand dominance: Right  PERTINENT HISTORY: [name] nearly 8 years ago, had fracture L prox humerus, with ORIF, developed traumatic arthritis, underwent reverse TSA  PAIN:  Are you having pain? Yes: NPRS scale: 2/10 Pain location: L anterior shoulder Pain description: throbbing  Aggravating factors: lifting arm Relieving factors: ice, heat, meds, sling  PRECAUTIONS: Shoulder  RED FLAGS: None   WEIGHT BEARING RESTRICTIONS: Yes NWB L UE   FALLS:  Has patient fallen in last 6 months? No  LIVING ENVIRONMENT: Lives with: lives with their spouse Lives in: House/apartment Stairs: No Has following equipment at home: sling   OCCUPATION: retired  PLOF: Independent  PATIENT GOALS:able to move and use L arm without pain  NEXT MD VISIT:   OBJECTIVE:  Note: Objective measures were completed at Evaluation unless otherwise noted.  DIAGNOSTIC FINDINGS:  na  PATIENT SURVEYS:  SPADI Total Disability Score: 59 / 80 = 73.8 % Total SPADI Score: 105 / 130 = 80.8 %   COGNITION: Overall cognitive status: Within functional limits for tasks assessed     SENSATION: Reports intermittent numbness  and weakness L hand and wrist   POSTURE: Very lean female, non tension tremor noted Ue's and LEs  UPPER EXTREMITY ROM:   Passive ROM Left eval Left 06/24/23 AROM Left 07/06/23 AROM  Shoulder flexion 88 86 89  Shoulder extension nt    Shoulder abduction 60 65 63  Shoulder adduction     Shoulder internal rotation nt    Shoulder external rotation 18    Elbow flexion wfl    Elbow extension wfl    Wrist flexion     Wrist extension     Wrist ulnar deviation     Wrist radial deviation     Wrist pronation     Wrist supination     (Blank rows =wfl)  UPPER EXTREMITY MMT:  MMT Right eval Left eval  Shoulder flexion  nt  Shoulder extension  nt  Shoulder abduction  nt  Shoulder adduction  nt  Shoulder internal rotation  nt  Shoulder external rotation  nt  Middle trapezius    Lower trapezius    Elbow flexion  3-  Elbow extension  3-  Wrist flexion    Wrist extension    Wrist ulnar deviation    Wrist radial deviation    Wrist pronation    Wrist supination    Grip strength (lbs)    (Blank rows = not tested)  PALPATION:  Tender, edematous medial upper arm Incision ant upper humerus healing, raised no redness or warmth noted                                                                                                                             TREATMENT DATE: 07/06/23 UBE L2 x 3 min each ROM Total Disability Score: 38 / 80 = 47.5 % Total SPADI Score: 53 / 130 =  40.8 % AAROM Flex & Abd w/ 1lb dowl 2x10 Shoulder flex & Abd AROM x10 LUE ER/IR with arm to side 2x10 yellow  LUE PROM with end range holds  within protocol limitations   06/24/23 UBE L1 x 3 min each  AAROM Flex & Abd w/ dowl 2x10 Rows & Ext (to neutral) red 2x10 LUE ER/IR with arm to side 2x10 yellow  AROM shoulder abe 2x10 LUE PROM with end range holds  within protocol limitations    06/22/23 UBE L1 x 2 min each AAROM Flex & Abd w/ dowl 2x10 Rows & Ext (to neutral) yellow 2x10 LUE ER/IR with arm to  side 2x10 yellow  LUE PROM with end range holds  within protocol limitations   06/17/23: Supine for L shoulder AAROM all planes, intermittent retrograde massage L upper arm Supine manually resisted isometrics for L shoulder abd, add, IR, ER 5 sec holds 10 reps Seated shoulder pulley 3 min flex  Seated man resist shoulder flex isometrics  15x Seated biceps curls 2# 15x Seated triceps ext manual resist 15x Standing abd swings with wand 15x Seated table slides into flex and scaption 15x Pendulum ex 10x   06/08/23 PROM LUE AAROM in supine with dowel  Flexion, abduction  x10 each LUE isometrics x4 all directions 1# [place] flexion to 90* x10 Shoulder abd to 90*x10 5# triceps extensions 2x10 Yellow band shoulder ext to neutral 2x10  06/01/23 Eval,  Supine for stretching L shoulder flex, abd, ER to tolerance Inst in L table flexion slides Also inst in isometrics for L shoulder flex, abd   Adapted standing L shoulder abd    PATIENT EDUCATION: Education details: POC, goals Person educated: Patient and Spouse Education method: Explanation, Demonstration, Tactile cues, Verbal cues, and Handouts Education comprehension: verbalized understanding, returned demonstration, and verbal cues required  HOME EXERCISE PROGRAM: Provided printed directions  ASSESSMENT:  CLINICAL IMPRESSION: Patient is a 68 y.o. female who was treated today by physical therapy due to recovery from L reverse TSA.  She is currently 9 weeks post of and this was her fifth therapy session after evaluation. She  presents with decreased L shoulder ROM, strength and function.  Continued to progressed her ex to tolerance, following Dr Leandro Proffer protocol. L shoulder strength and ROM limitation present. L shoulder elevation and protraction compensation with some movements. Tactile cues to prevent L shoulder protraction with IR,  She should benefit from skilled PT to address her recovery, she is well motivated and cooperative.    OBJECTIVE IMPAIRMENTS: decreased activity tolerance, decreased ROM, decreased strength, impaired UE functional use, postural dysfunction, and pain.   ACTIVITY LIMITATIONS: carrying, lifting, sleeping, transfers, bathing, dressing, self feeding, and reach over head  PARTICIPATION LIMITATIONS: meal prep, cleaning, laundry, driving, community activity, and yard work  PERSONAL FACTORS: Age, Behavior pattern, Fitness, Past/current experiences, Time since onset of injury/illness/exacerbation, and 1-2 comorbidities: h/o falls, emphysema are also affecting patient's functional outcome.   REHAB POTENTIAL: Good  CLINICAL DECISION MAKING: Evolving/moderate complexity  EVALUATION COMPLEXITY: Moderate   GOALS: Goals reviewed with patient? Yes  SHORT TERM GOALS: Target date: 2 weeks 06/15/23  IHEP Baseline: Goal status: INITIAL   LONG TERM GOALS: Target date: 08/24/23, 12 weeks  Total Disability Score: 59 / 80 = 73.8 % Total SPADI Score: 105 / 130 = 80.8 %: Improve to 20 % DISABLITY OR LESS  Baseline:  Goal status: On going 07/06/23  2.  Active ROM L shoulder elevation 130 or greater Baseline: na yet Goal  status: Ongoing 07/06/23  3.  Strength L shoulder flex, abd, IR 5/5, ER 4-/5 Baseline: nt Goal status: INITIAL   PLAN:  PT FREQUENCY: 2x/week  PT DURATION: 12 weeks  PLANNED INTERVENTIONS: 97110-Therapeutic exercises, 97530- Therapeutic activity, 97112- Neuromuscular re-education, 97535- Self Care, 16109- Manual therapy, and 97033- Ionotophoresis 4mg /ml Dexamethasone   PLAN FOR NEXT SESSION: continue with stretching PROM, manual techniques and modalities as needed to address pain management L shoulder   Ollen Beverage, PTA,  07/06/2023, 1:45 PM

## 2023-07-07 ENCOUNTER — Ambulatory Visit: Admitting: Physical Therapy

## 2023-07-07 ENCOUNTER — Encounter: Payer: Self-pay | Admitting: Physical Therapy

## 2023-07-07 DIAGNOSIS — M25512 Pain in left shoulder: Secondary | ICD-10-CM

## 2023-07-07 DIAGNOSIS — M25612 Stiffness of left shoulder, not elsewhere classified: Secondary | ICD-10-CM | POA: Diagnosis not present

## 2023-07-07 DIAGNOSIS — R29898 Other symptoms and signs involving the musculoskeletal system: Secondary | ICD-10-CM | POA: Diagnosis not present

## 2023-07-07 NOTE — Therapy (Signed)
 OUTPATIENT PHYSICAL THERAPY SHOULDER TREATMENT   Patient Name: Gabriella Farrell MRN: [id number] DOB:October 24, 1955, 68 y.o., female Today's Date: 07/07/2023  END OF SESSION:  PT End of Session - 07/07/23 1515     Visit Number 7    Date for PT Re-Evaluation 08/24/23    PT Start Time 1515    PT Stop Time 1600    PT Time Calculation (min) 45 min    Activity Tolerance Patient tolerated treatment well    Behavior During Therapy WFL for tasks assessed/performed              Past Medical History:  Diagnosis Date   Anxiety    Arthritis    Cancer (HCC)    lesion on right side of neck   Chronic kidney disease    COPD (chronic obstructive pulmonary disease) (HCC)    Depression    Discoid lupus erythematosus    GERD (gastroesophageal reflux disease)    H/O chronic pancreatitis    H/O ETOH abuse    Hypertension    Multiple pulmonary nodules    Stable per Pulmonology   SIADH (syndrome of inappropriate ADH production) (HCC)    Sleep apnea    no CPAP   Smoker    Past Surgical History:  Procedure Laterality Date   BREAST EXCISIONAL BIOPSY Right    CHOLECYSTECTOMY  2010   open cholecystectomy and also repaired ducts in pancreas   COLONOSCOPY W/ BIOPSIES AND POLYPECTOMY     x 5   ESOPHAGOGASTRODUODENOSCOPY     x 3   EYE SURGERY Bilateral 2020   cataract extractons   HARDWARE REMOVAL Left 04/30/2023   Procedure: REMOVAL, HARDWARE;  Surgeon: Ellard Gunning, MD;  Location: [facility];  Service: Orthopedics;  Laterality: Left;   REVERSE SHOULDER ARTHROPLASTY Left 04/30/2023   Procedure: ARTHROPLASTY, SHOULDER, TOTAL, REVERSE;  Surgeon: Ellard Gunning, MD;  Location: [facility];  Service: Orthopedics;  Laterality: Left;   SHOULDER SURGERY Left 2016   for Fx Humerus and dislocation   WISDOM TOOTH EXTRACTION     Patient Active Problem List   Diagnosis Date Noted   S/P reverse total shoulder arthroplasty, left 04/30/2023   Centrilobular emphysema (HCC) 04/06/2023   Lung nodules 04/06/2023    Preoperative respiratory examination 04/06/2023   Tobacco use disorder 09/27/2018   Excessive daytime sleepiness 09/27/2018   Snoring 09/27/2018   Anxiety 12/04/2014   Depression 12/04/2014    PCP: Darnelle Elders, PA-C  REFERRING PROVIDER: Ellard Gunning, MD  REFERRING DIAG: s/p removal of hardware and reverse TSA  THERAPY DIAG:  Acute pain of left shoulder  Stiffness of left shoulder, not elsewhere classified  Weakness of left shoulder  Rationale for Evaluation and Treatment: Rehabilitation  ONSET DATE: 04/30/23  SUBJECTIVE:  SUBJECTIVE STATEMENT: "Tired" Sore from yesterdays PT session and packing for traveliing  Hand dominance: Right  PERTINENT HISTORY: Gabriella Farrell nearly 8 years ago, had fracture L prox humerus, with ORIF, developed traumatic arthritis, underwent reverse TSA  PAIN:  Are you having pain? Yes: NPRS scale: 4/10 Pain location: L anterior shoulder Pain description: throbbing  Aggravating factors: lifting arm Relieving factors: ice, heat, meds, sling  PRECAUTIONS: Shoulder  RED FLAGS: None   WEIGHT BEARING RESTRICTIONS: Yes NWB L UE   FALLS:  Has patient fallen in last 6 months? No  LIVING ENVIRONMENT: Lives with: lives with their spouse Lives in: House/apartment Stairs: No Has following equipment at home: sling   OCCUPATION: retired  PLOF: Independent  PATIENT GOALS:able to move and use L arm without pain  NEXT MD VISIT:   OBJECTIVE:  Note: Objective measures were completed at Evaluation unless otherwise noted.  DIAGNOSTIC FINDINGS:  na  PATIENT SURVEYS:  SPADI Total Disability Score: 59 / 80 = 73.8 % Total SPADI Score: 105 / 130 = 80.8 %   COGNITION: Overall cognitive status: Within functional limits for tasks assessed     SENSATION: Reports  intermittent numbness and weakness L hand and wrist   POSTURE: Very lean female, non tension tremor noted Ue's and LEs  UPPER EXTREMITY ROM:   Passive ROM Left eval Left 06/24/23 AROM Left 07/06/23 AROM  Shoulder flexion 88 86 89  Shoulder extension nt    Shoulder abduction 60 65 63  Shoulder adduction     Shoulder internal rotation nt    Shoulder external rotation 18    Elbow flexion wfl    Elbow extension wfl    Wrist flexion     Wrist extension     Wrist ulnar deviation     Wrist radial deviation     Wrist pronation     Wrist supination     (Blank rows =wfl)  UPPER EXTREMITY MMT:  MMT Right eval Left eval  Shoulder flexion  nt  Shoulder extension  nt  Shoulder abduction  nt  Shoulder adduction  nt  Shoulder internal rotation  nt  Shoulder external rotation  nt  Middle trapezius    Lower trapezius    Elbow flexion  3-  Elbow extension  3-  Wrist flexion    Wrist extension    Wrist ulnar deviation    Wrist radial deviation    Wrist pronation    Wrist supination    Grip strength (lbs)    (Blank rows = not tested)  PALPATION:  Tender, edematous medial upper arm Incision ant upper humerus healing, raised no redness or warmth noted                                                                                                                             TREATMENT DATE: 07/07/23 UBE L2 x 3 min each Rows & Ext ( neutral ) 2x10 LUE ER/IR with arm to side 2x10 yellow  Shoulder flex & Abd AROM x10 Rows & Lats 15lb 2x10 LUE PROM with end range holds  within protocol limitations   GH Jt mobs   07/06/23 UBE L2 x 3 min each ROM Total Disability Score: 38 / 80 = 47.5 % Total SPADI Score: 53 / 130 = 40.8 % AAROM Flex & Abd w/ 1lb dowl 2x10 Shoulder flex & Abd AROM x10 LUE ER/IR with arm to side 2x10 yellow  LUE PROM with end range holds  within protocol limitations   06/24/23 UBE L1 x 3 min each  AAROM Flex & Abd w/ dowl 2x10 Rows & Ext (to neutral) red  2x10 LUE ER/IR with arm to side 2x10 yellow  AROM shoulder abe 2x10 LUE PROM with end range holds  within protocol limitations    06/22/23 UBE L1 x 2 min each AAROM Flex & Abd w/ dowl 2x10 Rows & Ext (to neutral) yellow 2x10 LUE ER/IR with arm to side 2x10 yellow  LUE PROM with end range holds  within protocol limitations   06/17/23: Supine for L shoulder AAROM all planes, intermittent retrograde massage L upper arm Supine manually resisted isometrics for L shoulder abd, add, IR, ER 5 sec holds 10 reps Seated shoulder pulley 3 min flex  Seated man resist shoulder flex isometrics  15x Seated biceps curls 2# 15x Seated triceps ext manual resist 15x Standing abd swings with wand 15x Seated table slides into flex and scaption 15x Pendulum ex 10x   06/08/23 PROM LUE AAROM in supine with dowel  Flexion, abduction  x10 each LUE isometrics x4 all directions 1# [place] flexion to 90* x10 Shoulder abd to 90*x10 5# triceps extensions 2x10 Yellow band shoulder ext to neutral 2x10  06/01/23 Eval,  Supine for stretching L shoulder flex, abd, ER to tolerance Inst in L table flexion slides Also inst in isometrics for L shoulder flex, abd   Adapted standing L shoulder abd    PATIENT EDUCATION: Education details: POC, goals Person educated: Patient and Spouse Education method: Explanation, Demonstration, Tactile cues, Verbal cues, and Handouts Education comprehension: verbalized understanding, returned demonstration, and verbal cues required  HOME EXERCISE PROGRAM: Provided printed directions  ASSESSMENT:  CLINICAL IMPRESSION: Patient is a 68 y.o. female who was treated today by physical therapy due to recovery from L reverse TSA.  She is currently 9 weeks 5 days post. She  presents with decreased L shoulder ROM, strength and function.  Continued to progressed her ex to tolerance, following Dr Leandro Proffer protocol. L shoulder strength and ROM limitation present. No issue with a  progression to machine level interventions. Some postural compensation with lat pulls but repots no pain. Tactile cues to prevent L shoulder protraction with IR,  She should benefit from skilled PT to address her recovery, she is well motivated and cooperative.   OBJECTIVE IMPAIRMENTS: decreased activity tolerance, decreased ROM, decreased strength, impaired UE functional use, postural dysfunction, and pain.   ACTIVITY LIMITATIONS: carrying, lifting, sleeping, transfers, bathing, dressing, self feeding, and reach over head  PARTICIPATION LIMITATIONS: meal prep, cleaning, laundry, driving, community activity, and yard work  PERSONAL FACTORS: Age, Behavior pattern, Fitness, Past/current experiences, Time since onset of injury/illness/exacerbation, and 1-2 comorbidities: h/o falls, emphysema are also affecting patient's functional outcome.   REHAB POTENTIAL: Good  CLINICAL DECISION MAKING: Evolving/moderate complexity  EVALUATION COMPLEXITY: Moderate   GOALS: Goals reviewed with patient? Yes  SHORT TERM GOALS: Target date: 2 weeks 06/15/23  IHEP Baseline: Goal status: INITIAL   LONG TERM GOALS:  Target date: 08/24/23, 12 weeks  Total Disability Score: 59 / 80 = 73.8 % Total SPADI Score: 105 / 130 = 80.8 %: Improve to 20 % DISABLITY OR LESS  Baseline:  Goal status: On going 07/06/23  2.  Active ROM L shoulder elevation 130 or greater Baseline: na yet Goal status: Ongoing 07/06/23  3.  Strength L shoulder flex, abd, IR 5/5, ER 4-/5 Baseline: nt Goal status: INITIAL   PLAN:  PT FREQUENCY: 2x/week  PT DURATION: 12 weeks  PLANNED INTERVENTIONS: 97110-Therapeutic exercises, 97530- Therapeutic activity, 97112- Neuromuscular re-education, 97535- Self Care, 16109- Manual therapy, and 97033- Ionotophoresis 4mg /ml Dexamethasone   PLAN FOR NEXT SESSION: continue with stretching PROM, manual techniques and modalities as needed to address pain management L shoulder   Ollen Beverage,  PTA,  07/07/2023, 3:16 PM

## 2023-07-20 ENCOUNTER — Ambulatory Visit: Admitting: Physical Therapy

## 2023-07-20 ENCOUNTER — Encounter: Payer: Self-pay | Admitting: Physical Therapy

## 2023-07-20 DIAGNOSIS — M25612 Stiffness of left shoulder, not elsewhere classified: Secondary | ICD-10-CM

## 2023-07-20 DIAGNOSIS — M25512 Pain in left shoulder: Secondary | ICD-10-CM | POA: Diagnosis not present

## 2023-07-20 DIAGNOSIS — R29898 Other symptoms and signs involving the musculoskeletal system: Secondary | ICD-10-CM | POA: Diagnosis not present

## 2023-07-20 DIAGNOSIS — E871 Hypo-osmolality and hyponatremia: Secondary | ICD-10-CM | POA: Diagnosis not present

## 2023-07-20 NOTE — Therapy (Signed)
 OUTPATIENT PHYSICAL THERAPY SHOULDER TREATMENT   Patient Name: Gabriella Farrell MRN: [id number] DOB:02-Nov-1955, 68 y.o., female Today's Date: 07/20/2023  END OF SESSION:  PT End of Session - 07/20/23 1345     Visit Number 8    Date for PT Re-Evaluation 08/24/23    PT Start Time 1345    PT Stop Time 1430    PT Time Calculation (min) 45 min    Activity Tolerance Patient tolerated treatment well    Behavior During Therapy WFL for tasks assessed/performed           Past Medical History:  Diagnosis Date   Anxiety    Arthritis    Cancer (HCC)    lesion on right side of neck   Chronic kidney disease    COPD (chronic obstructive pulmonary disease) (HCC)    Depression    Discoid lupus erythematosus    GERD (gastroesophageal reflux disease)    H/O chronic pancreatitis    H/O ETOH abuse    Hypertension    Multiple pulmonary nodules    Stable per Pulmonology   SIADH (syndrome of inappropriate ADH production) (HCC)    Sleep apnea    no CPAP   Smoker    Past Surgical History:  Procedure Laterality Date   BREAST EXCISIONAL BIOPSY Right    CHOLECYSTECTOMY  2010   open cholecystectomy and also repaired ducts in pancreas   COLONOSCOPY W/ BIOPSIES AND POLYPECTOMY     x 5   ESOPHAGOGASTRODUODENOSCOPY     x 3   EYE SURGERY Bilateral 2020   cataract extractons   HARDWARE REMOVAL Left 04/30/2023   Procedure: REMOVAL, HARDWARE;  Surgeon: Ellard Gunning, MD;  Location: [facility];  Service: Orthopedics;  Laterality: Left;   REVERSE SHOULDER ARTHROPLASTY Left 04/30/2023   Procedure: ARTHROPLASTY, SHOULDER, TOTAL, REVERSE;  Surgeon: Ellard Gunning, MD;  Location: [facility];  Service: Orthopedics;  Laterality: Left;   SHOULDER SURGERY Left 2016   for Fx Humerus and dislocation   WISDOM TOOTH EXTRACTION     Patient Active Problem List   Diagnosis Date Noted   S/P reverse total shoulder arthroplasty, left 04/30/2023   Centrilobular emphysema (HCC) 04/06/2023   Lung nodules 04/06/2023    Preoperative respiratory examination 04/06/2023   Tobacco use disorder 09/27/2018   Excessive daytime sleepiness 09/27/2018   Snoring 09/27/2018   Anxiety 12/04/2014   Depression 12/04/2014    PCP: Gabriella Elders, PA-C  REFERRING PROVIDER: Ellard Gunning, MD  REFERRING DIAG: s/p removal of hardware and reverse TSA  THERAPY DIAG:  Acute pain of left shoulder  Stiffness of left shoulder, not elsewhere classified  Weakness of left shoulder  Rationale for Evaluation and Treatment: Rehabilitation  ONSET DATE: 04/30/23  SUBJECTIVE:  SUBJECTIVE STATEMENT: Not bad, good, kids wore me out Stronger  Hand dominance: Right  PERTINENT HISTORY: Gabriella Farrell nearly 8 years ago, had fracture L prox humerus, with ORIF, developed traumatic arthritis, underwent reverse TSA  PAIN:  Are you having pain? Yes: NPRS scale: 3/10 Pain location: L anterior shoulder Pain description: throbbing  Aggravating factors: lifting arm Relieving factors: ice, heat, meds, sling  PRECAUTIONS: Shoulder  RED FLAGS: None   WEIGHT BEARING RESTRICTIONS: Yes NWB L UE   FALLS:  Has patient fallen in last 6 months? No  LIVING ENVIRONMENT: Lives with: lives with their spouse Lives in: House/apartment Stairs: No Has following equipment at home: sling   OCCUPATION: retired  PLOF: Independent  PATIENT GOALS:able to move and use L arm without pain  NEXT MD VISIT:   OBJECTIVE:  Note: Objective measures were completed at Evaluation unless otherwise noted.  DIAGNOSTIC FINDINGS:  na  PATIENT SURVEYS:  SPADI Total Disability Score: 59 / 80 = 73.8 % Total SPADI Score: 105 / 130 = 80.8 %   COGNITION: Overall cognitive status: Within functional limits for tasks assessed     SENSATION: Reports intermittent numbness and  weakness L hand and wrist   POSTURE: Very lean female, non tension tremor noted Ue's and LEs  UPPER EXTREMITY ROM:   Passive ROM Left eval Left 06/24/23 AROM Left 07/06/23 AROM  Shoulder flexion 88 86 89  Shoulder extension nt    Shoulder abduction 60 65 63  Shoulder adduction     Shoulder internal rotation nt    Shoulder external rotation 18    Elbow flexion wfl    Elbow extension wfl    Wrist flexion     Wrist extension     Wrist ulnar deviation     Wrist radial deviation     Wrist pronation     Wrist supination     (Blank rows =wfl)  UPPER EXTREMITY MMT:  MMT Right eval Left eval  Shoulder flexion  nt  Shoulder extension  nt  Shoulder abduction  nt  Shoulder adduction  nt  Shoulder internal rotation  nt  Shoulder external rotation  nt  Middle trapezius    Lower trapezius    Elbow flexion  3-  Elbow extension  3-  Wrist flexion    Wrist extension    Wrist ulnar deviation    Wrist radial deviation    Wrist pronation    Wrist supination    Grip strength (lbs)    (Blank rows = not tested)  PALPATION:  Tender, edematous medial upper arm Incision ant upper humerus healing, raised no redness or warmth noted                                                                                                                             TREATMENT DATE: 07/20/23 UBE L2 x 3 min each Rows & Lats 15lb 2x10 Shoulder flex & Abd AROM x10  1lb x 10 LUE ER/IR  with arm to side 2x10 yellow  15lb triceps Ext 10lb 2x10 LUE PROM with end range holds  within protocol limitations   GH Jt mobs   07/07/23 UBE L2 x 3 min each Rows & Ext ( neutral ) 2x10 LUE ER/IR with arm to side 2x10 yellow  Shoulder flex & Abd AROM x10 Rows & Lats 15lb 2x10 LUE PROM with end range holds  within protocol limitations   GH Jt mobs   07/06/23 UBE L2 x 3 min each ROM Total Disability Score: 38 / 80 = 47.5 % Total SPADI Score: 53 / 130 = 40.8 % AAROM Flex & Abd w/ 1lb dowl 2x10 Shoulder flex &  Abd AROM x10 LUE ER/IR with arm to side 2x10 yellow  LUE PROM with end range holds  within protocol limitations   06/24/23 UBE L1 x 3 min each  AAROM Flex & Abd w/ dowl 2x10 Rows & Ext (to neutral) red 2x10 LUE ER/IR with arm to side 2x10 yellow  AROM shoulder abe 2x10 LUE PROM with end range holds  within protocol limitations    06/22/23 UBE L1 x 2 min each AAROM Flex & Abd w/ dowl 2x10 Rows & Ext (to neutral) yellow 2x10 LUE ER/IR with arm to side 2x10 yellow  LUE PROM with end range holds  within protocol limitations   06/17/23: Supine for L shoulder AAROM all planes, intermittent retrograde massage L upper arm Supine manually resisted isometrics for L shoulder abd, add, IR, ER 5 sec holds 10 reps Seated shoulder pulley 3 min flex  Seated man resist shoulder flex isometrics  15x Seated biceps curls 2# 15x Seated triceps ext manual resist 15x Standing abd swings with wand 15x Seated table slides into flex and scaption 15x Pendulum ex 10x   06/08/23 PROM LUE AAROM in supine with dowel  Flexion, abduction  x10 each LUE isometrics x4 all directions 1# [place] flexion to 90* x10 Shoulder abd to 90*x10 5# triceps extensions 2x10 Yellow band shoulder ext to neutral 2x10  06/01/23 Eval,  Supine for stretching L shoulder flex, abd, ER to tolerance Inst in L table flexion slides Also inst in isometrics for L shoulder flex, abd   Adapted standing L shoulder abd    PATIENT EDUCATION: Education details: POC, goals Person educated: Patient and Spouse Education method: Explanation, Demonstration, Tactile cues, Verbal cues, and Handouts Education comprehension: verbalized understanding, returned demonstration, and verbal cues required  HOME EXERCISE PROGRAM: Provided printed directions  ASSESSMENT:  CLINICAL IMPRESSION: Patient is a 68 y.o. female who was treated today by physical therapy due to recovery from L reverse TSA.   She presents with decreased L shoulder ROM,  strength and function.  Continued to progressed her ex to tolerance, following Dr Leandro Proffer protocol. L shoulder strength and ROM limitation present.good carryover form last session with  machine level interventions. Some postural compensation with lat pulls but repots no pain. Tactile cues to prevent L shoulder protraction with IR. Cues for L shoulder elevation with flexion and abduction  She should benefit from skilled PT to address her recovery, she is well motivated and cooperative.   OBJECTIVE IMPAIRMENTS: decreased activity tolerance, decreased ROM, decreased strength, impaired UE functional use, postural dysfunction, and pain.   ACTIVITY LIMITATIONS: carrying, lifting, sleeping, transfers, bathing, dressing, self feeding, and reach over head  PARTICIPATION LIMITATIONS: meal prep, cleaning, laundry, driving, community activity, and yard work  PERSONAL FACTORS: Age, Behavior pattern, Fitness, Past/current experiences, Time since onset of injury/illness/exacerbation, and 1-2  comorbidities: h/o falls, emphysema are also affecting patient's functional outcome.   REHAB POTENTIAL: Good  CLINICAL DECISION MAKING: Evolving/moderate complexity  EVALUATION COMPLEXITY: Moderate   GOALS: Goals reviewed with patient? Yes  SHORT TERM GOALS: Target date: 2 weeks 06/15/23  IHEP Baseline: Goal status: INITIAL   LONG TERM GOALS: Target date: 08/24/23, 12 weeks  Total Disability Score: 59 / 80 = 73.8 % Total SPADI Score: 105 / 130 = 80.8 %: Improve to 20 % DISABLITY OR LESS  Baseline:  Goal status: On going 07/06/23  2.  Active ROM L shoulder elevation 130 or greater Baseline: na yet Goal status: Ongoing 07/06/23  3.  Strength L shoulder flex, abd, IR 5/5, ER 4-/5 Baseline: nt Goal status: INITIAL   PLAN:  PT FREQUENCY: 2x/week  PT DURATION: 12 weeks  PLANNED INTERVENTIONS: 97110-Therapeutic exercises, 97530- Therapeutic activity, 97112- Neuromuscular re-education, 97535- Self Care,  97140- Manual therapy, and 21308- Ionotophoresis 4mg /ml Dexamethasone   PLAN FOR NEXT SESSION: continue with stretching PROM, manual techniques and modalities as needed to address pain management L shoulder   Ollen Beverage, PTA,  07/20/2023, 1:45 PM

## 2023-07-22 DIAGNOSIS — R911 Solitary pulmonary nodule: Secondary | ICD-10-CM | POA: Diagnosis not present

## 2023-07-22 DIAGNOSIS — E871 Hypo-osmolality and hyponatremia: Secondary | ICD-10-CM | POA: Diagnosis not present

## 2023-07-22 DIAGNOSIS — I10 Essential (primary) hypertension: Secondary | ICD-10-CM | POA: Diagnosis not present

## 2023-07-22 DIAGNOSIS — E222 Syndrome of inappropriate secretion of antidiuretic hormone: Secondary | ICD-10-CM | POA: Diagnosis not present

## 2023-07-23 ENCOUNTER — Encounter: Payer: Self-pay | Admitting: Physical Therapy

## 2023-07-23 ENCOUNTER — Ambulatory Visit: Admitting: Physical Therapy

## 2023-07-23 DIAGNOSIS — M25612 Stiffness of left shoulder, not elsewhere classified: Secondary | ICD-10-CM | POA: Diagnosis not present

## 2023-07-23 DIAGNOSIS — R29898 Other symptoms and signs involving the musculoskeletal system: Secondary | ICD-10-CM | POA: Diagnosis not present

## 2023-07-23 DIAGNOSIS — M25512 Pain in left shoulder: Secondary | ICD-10-CM | POA: Diagnosis not present

## 2023-07-23 NOTE — Therapy (Signed)
 OUTPATIENT PHYSICAL THERAPY SHOULDER TREATMENT   Patient Name: Gabriella Farrell MRN: [id number] DOB:1955/12/04, 68 y.o., female Today's Date: 07/23/2023  END OF SESSION:  PT End of Session - 07/23/23 1258     Visit Number 9    Date for PT Re-Evaluation 08/24/23    PT Start Time 1300    PT Stop Time 1345    PT Time Calculation (min) 45 min    Activity Tolerance Patient tolerated treatment well    Behavior During Therapy WFL for tasks assessed/performed           Past Medical History:  Diagnosis Date   Anxiety    Arthritis    Cancer (HCC)    lesion on right side of neck   Chronic kidney disease    COPD (chronic obstructive pulmonary disease) (HCC)    Depression    Discoid lupus erythematosus    GERD (gastroesophageal reflux disease)    H/O chronic pancreatitis    H/O ETOH abuse    Hypertension    Multiple pulmonary nodules    Stable per Pulmonology   SIADH (syndrome of inappropriate ADH production) (HCC)    Sleep apnea    no CPAP   Smoker    Past Surgical History:  Procedure Laterality Date   BREAST EXCISIONAL BIOPSY Right    CHOLECYSTECTOMY  2010   open cholecystectomy and also repaired ducts in pancreas   COLONOSCOPY W/ BIOPSIES AND POLYPECTOMY     x 5   ESOPHAGOGASTRODUODENOSCOPY     x 3   EYE SURGERY Bilateral 2020   cataract extractons   HARDWARE REMOVAL Left 04/30/2023   Procedure: REMOVAL, HARDWARE;  Surgeon: Ellard Gunning, MD;  Location: [facility];  Service: Orthopedics;  Laterality: Left;   REVERSE SHOULDER ARTHROPLASTY Left 04/30/2023   Procedure: ARTHROPLASTY, SHOULDER, TOTAL, REVERSE;  Surgeon: Ellard Gunning, MD;  Location: [facility];  Service: Orthopedics;  Laterality: Left;   SHOULDER SURGERY Left 2016   for Fx Humerus and dislocation   WISDOM TOOTH EXTRACTION     Patient Active Problem List   Diagnosis Date Noted   S/P reverse total shoulder arthroplasty, left 04/30/2023   Centrilobular emphysema (HCC) 04/06/2023   Lung nodules 04/06/2023    Preoperative respiratory examination 04/06/2023   Tobacco use disorder 09/27/2018   Excessive daytime sleepiness 09/27/2018   Snoring 09/27/2018   Anxiety 12/04/2014   Depression 12/04/2014    PCP: Darnelle Elders, PA-C  REFERRING PROVIDER: Ellard Gunning, MD  REFERRING DIAG: s/p removal of hardware and reverse TSA  THERAPY DIAG:  Acute pain of left shoulder  Stiffness of left shoulder, not elsewhere classified  Weakness of left shoulder  Rationale for Evaluation and Treatment: Rehabilitation  ONSET DATE: 04/30/23  SUBJECTIVE:  SUBJECTIVE STATEMENT: Stiffness with shot of pain every once in a while for neck down to the elbow  Hand dominance: Right  PERTINENT HISTORY: [name] nearly 8 years ago, had fracture L prox humerus, with ORIF, developed traumatic arthritis, underwent reverse TSA  PAIN:  Are you having pain? Yes: NPRS scale: 4/10 Pain location: L anterior shoulder Pain description: throbbing  Aggravating factors: lifting arm Relieving factors: ice, heat, meds, sling  PRECAUTIONS: Shoulder  RED FLAGS: None   WEIGHT BEARING RESTRICTIONS: Yes NWB L UE   FALLS:  Has patient fallen in last 6 months? No  LIVING ENVIRONMENT: Lives with: lives with their spouse Lives in: House/apartment Stairs: No Has following equipment at home: sling   OCCUPATION: retired  PLOF: Independent  PATIENT GOALS:able to move and use L arm without pain  NEXT MD VISIT:   OBJECTIVE:  Note: Objective measures were completed at Evaluation unless otherwise noted.  DIAGNOSTIC FINDINGS:  na  PATIENT SURVEYS:  SPADI Total Disability Score: 59 / 80 = 73.8 % Total SPADI Score: 105 / 130 = 80.8 %   COGNITION: Overall cognitive status: Within functional limits for tasks  assessed     SENSATION: Reports intermittent numbness and weakness L hand and wrist   POSTURE: Very lean female, non tension tremor noted Ue's and LEs  UPPER EXTREMITY ROM:   Passive ROM Left eval Left 06/24/23 AROM Left 07/06/23 AROM Left AROM 07/23/23  Shoulder flexion 88 86 89 110  Shoulder extension nt     Shoulder abduction 60 65 63 80  Shoulder adduction      Shoulder internal rotation nt     Shoulder external rotation 18     Elbow flexion wfl     Elbow extension wfl     Wrist flexion      Wrist extension      Wrist ulnar deviation      Wrist radial deviation      Wrist pronation      Wrist supination      (Blank rows =wfl)  UPPER EXTREMITY MMT:  MMT Right eval Left eval  Shoulder flexion  nt  Shoulder extension  nt  Shoulder abduction  nt  Shoulder adduction  nt  Shoulder internal rotation  nt  Shoulder external rotation  nt  Middle trapezius    Lower trapezius    Elbow flexion  3-  Elbow extension  3-  Wrist flexion    Wrist extension    Wrist ulnar deviation    Wrist radial deviation    Wrist pronation    Wrist supination    Grip strength (lbs)    (Blank rows = not tested)  PALPATION:  Tender, edematous medial upper arm Incision ant upper humerus healing, raised no redness or warmth noted                                                                                                                             TREATMENT  DATE: 07/23/23 UBE L2 x 3 min each STM to L UT, cervical spine, Deltoid  LUE PROM with end range holds  within protocol limitations    GH Jt mobs  Rows & Ext red 2x10 AAROM flex 2x10 2lb  Shoulder abd AROM 2x10  07/20/23 UBE L2 x 3 min each Rows & Lats 15lb 2x10 Shoulder flex & Abd AROM x10  1lb x 10 LUE ER/IR with arm to side 2x10 yellow  15lb triceps Ext 10lb 2x10 LUE PROM with end range holds  within protocol limitations   GH Jt mobs   07/07/23 UBE L2 x 3 min each Rows & Ext ( neutral ) 2x10 LUE ER/IR with arm to  side 2x10 yellow  Shoulder flex & Abd AROM x10 Rows & Lats 15lb 2x10 LUE PROM with end range holds  within protocol limitations   GH Jt mobs   07/06/23 UBE L2 x 3 min each ROM Total Disability Score: 38 / 80 = 47.5 % Total SPADI Score: 53 / 130 = 40.8 % AAROM Flex & Abd w/ 1lb dowl 2x10 Shoulder flex & Abd AROM x10 LUE ER/IR with arm to side 2x10 yellow  LUE PROM with end range holds  within protocol limitations   06/24/23 UBE L1 x 3 min each  AAROM Flex & Abd w/ dowl 2x10 Rows & Ext (to neutral) red 2x10 LUE ER/IR with arm to side 2x10 yellow  AROM shoulder abe 2x10 LUE PROM with end range holds  within protocol limitations    06/22/23 UBE L1 x 2 min each AAROM Flex & Abd w/ dowl 2x10 Rows & Ext (to neutral) yellow 2x10 LUE ER/IR with arm to side 2x10 yellow  LUE PROM with end range holds  within protocol limitations    PATIENT EDUCATION: Education details: POC, goals Person educated: Patient and Spouse Education method: Explanation, Demonstration, Tactile cues, Verbal cues, and Handouts Education comprehension: verbalized understanding, returned demonstration, and verbal cues required  HOME EXERCISE PROGRAM: Provided printed directions  ASSESSMENT:  CLINICAL IMPRESSION: Patient is a 68 y.o. female who was treated today by physical therapy due to recovery from L reverse TSA.   She presents with decreased L shoulder ROM, strength and function. She enter with more pain and discomfort that's starts form the L side of her neck down to het L elbow. Positive response to STM evident by improved tissue elasticity and decrease pain.  Continued ex to tolerance , following Dr Leandro Proffer protocol.   She reports a follow up appointment tomorrow. She should benefit from skilled PT to address her recovery, she is well motivated and cooperative.   OBJECTIVE IMPAIRMENTS: decreased activity tolerance, decreased ROM, decreased strength, impaired UE functional use, postural dysfunction, and  pain.   ACTIVITY LIMITATIONS: carrying, lifting, sleeping, transfers, bathing, dressing, self feeding, and reach over head  PARTICIPATION LIMITATIONS: meal prep, cleaning, laundry, driving, community activity, and yard work  PERSONAL FACTORS: Age, Behavior pattern, Fitness, Past/current experiences, Time since onset of injury/illness/exacerbation, and 1-2 comorbidities: h/o falls, emphysema are also affecting patient's functional outcome.   REHAB POTENTIAL: Good  CLINICAL DECISION MAKING: Evolving/moderate complexity  EVALUATION COMPLEXITY: Moderate   GOALS: Goals reviewed with patient? Yes  SHORT TERM GOALS: Target date: 2 weeks 06/15/23  IHEP Baseline: Goal status: Met 07/23/23   LONG TERM GOALS: Target date: 08/24/23, 12 weeks  Total Disability Score: 59 / 80 = 73.8 % Total SPADI Score: 105 / 130 = 80.8 %: Improve to 20 % DISABLITY OR LESS  Baseline:  Goal status: On going 07/06/23, 07/23/23 Total Disability Score: 21 / 80 = 26.3 % Total SPADI Score: 33 / 130 = 25.4 %  2.  Active ROM L shoulder elevation 130 or greater Baseline: na yet Goal status: Ongoing 07/06/23, Ongoing 07/22/24  3.  Strength L shoulder flex, abd, IR 5/5, ER 4-/5 Baseline: nt Goal status: INITIAL   PLAN:  PT FREQUENCY: 2x/week  PT DURATION: 12 weeks  PLANNED INTERVENTIONS: 97110-Therapeutic exercises, 97530- Therapeutic activity, 97112- Neuromuscular re-education, 97535- Self Care, 09811- Manual therapy, and 97033- Ionotophoresis 4mg /ml Dexamethasone   PLAN FOR NEXT SESSION: continue with stretching PROM, manual techniques and modalities as needed to address pain management L shoulder   Ollen Beverage, PTA,  07/23/2023, 12:58 PM

## 2023-07-27 DIAGNOSIS — M542 Cervicalgia: Secondary | ICD-10-CM | POA: Diagnosis not present

## 2023-07-27 DIAGNOSIS — Z96612 Presence of left artificial shoulder joint: Secondary | ICD-10-CM | POA: Diagnosis not present

## 2023-07-28 ENCOUNTER — Ambulatory Visit: Admitting: Physical Therapy

## 2023-07-28 ENCOUNTER — Encounter: Payer: Self-pay | Admitting: Physical Therapy

## 2023-07-28 DIAGNOSIS — R29898 Other symptoms and signs involving the musculoskeletal system: Secondary | ICD-10-CM

## 2023-07-28 DIAGNOSIS — M25512 Pain in left shoulder: Secondary | ICD-10-CM

## 2023-07-28 DIAGNOSIS — M25612 Stiffness of left shoulder, not elsewhere classified: Secondary | ICD-10-CM | POA: Diagnosis not present

## 2023-07-28 NOTE — Therapy (Addendum)
 OUTPATIENT PHYSICAL THERAPY SHOULDER TREATMENT Progress Note Reporting Period 06/01/23 to 07/28/23  See note below for Objective Data and Assessment of Progress/Goals.      Patient Name: Gabriella Farrell MRN: [id number] DOB:1955/08/08, 68 y.o., female Today's Date: 07/28/2023  END OF SESSION:  PT End of Session - 07/28/23 1301     Visit Number 10    Date for PT Re-Evaluation 08/24/23    PT Start Time 1300    PT Stop Time 1345    PT Time Calculation (min) 45 min    Activity Tolerance Patient tolerated treatment well    Behavior During Therapy WFL for tasks assessed/performed           Past Medical History:  Diagnosis Date   Anxiety    Arthritis    Cancer (HCC)    lesion on right side of neck   Chronic kidney disease    COPD (chronic obstructive pulmonary disease) (HCC)    Depression    Discoid lupus erythematosus    GERD (gastroesophageal reflux disease)    H/O chronic pancreatitis    H/O ETOH abuse    Hypertension    Multiple pulmonary nodules    Stable per Pulmonology   SIADH (syndrome of inappropriate ADH production) (HCC)    Sleep apnea    no CPAP   Smoker    Past Surgical History:  Procedure Laterality Date   BREAST EXCISIONAL BIOPSY Right    CHOLECYSTECTOMY  2010   open cholecystectomy and also repaired ducts in pancreas   COLONOSCOPY W/ BIOPSIES AND POLYPECTOMY     x 5   ESOPHAGOGASTRODUODENOSCOPY     x 3   EYE SURGERY Bilateral 2020   cataract extractons   HARDWARE REMOVAL Left 04/30/2023   Procedure: REMOVAL, HARDWARE;  Surgeon: Melita Drivers, MD;  Location: [facility];  Service: Orthopedics;  Laterality: Left;   REVERSE SHOULDER ARTHROPLASTY Left 04/30/2023   Procedure: ARTHROPLASTY, SHOULDER, TOTAL, REVERSE;  Surgeon: Melita Drivers, MD;  Location: [facility];  Service: Orthopedics;  Laterality: Left;   SHOULDER SURGERY Left 2016   for Fx Humerus and dislocation   WISDOM TOOTH EXTRACTION     Patient Active Problem List   Diagnosis Date Noted   S/P  reverse total shoulder arthroplasty, left 04/30/2023   Centrilobular emphysema (HCC) 04/06/2023   Lung nodules 04/06/2023   Preoperative respiratory examination 04/06/2023   Tobacco use disorder 09/27/2018   Excessive daytime sleepiness 09/27/2018   Snoring 09/27/2018   Anxiety 12/04/2014   Depression 12/04/2014    PCP: Katina Pfeiffer, PA-C  REFERRING PROVIDER: Melita Drivers, MD  REFERRING DIAG: s/p removal of hardware and reverse TSA  THERAPY DIAG:  Acute pain of left shoulder  Stiffness of left shoulder, not elsewhere classified  Weakness of left shoulder  Rationale for Evaluation and Treatment: Rehabilitation  ONSET DATE: 04/30/23  SUBJECTIVE:  SUBJECTIVE STATEMENT: Md was pleased, he said 4 weeks of PT. Good, numbness in her L hand  Hand dominance: Right  PERTINENT HISTORY: [place] nearly 8 years ago, had fracture L prox humerus, with ORIF, developed traumatic arthritis, underwent reverse TSA  PAIN:  Are you having pain? Yes: NPRS scale: 1/10 Pain location: L anterior shoulder Pain description: throbbing  Aggravating factors: lifting arm Relieving factors: ice, heat, meds, sling  PRECAUTIONS: Shoulder  RED FLAGS: None   WEIGHT BEARING RESTRICTIONS: Yes NWB L UE   FALLS:  Has patient fallen in last 6 months? No  LIVING ENVIRONMENT: Lives with: lives with their spouse Lives in: House/apartment Stairs: No Has following equipment at home: sling   OCCUPATION: retired  PLOF: Independent  PATIENT GOALS:able to move and use L arm without pain  NEXT MD VISIT:   OBJECTIVE:  Note: Objective measures were completed at Evaluation unless otherwise noted.  DIAGNOSTIC FINDINGS:  na  PATIENT SURVEYS:  SPADI Total Disability Score: 59 / 80 = 73.8 % Total SPADI Score: 105 / 130  = 80.8 %   COGNITION: Overall cognitive status: Within functional limits for tasks assessed     SENSATION: Reports intermittent numbness and weakness L hand and wrist   POSTURE: Very lean female, non tension tremor noted Ue's and LEs  UPPER EXTREMITY ROM:   Passive ROM Left eval Left 06/24/23 AROM Left 07/06/23 AROM Left AROM 07/23/23  Shoulder flexion 88 86 89 110  Shoulder extension nt     Shoulder abduction 60 65 63 80  Shoulder adduction      Shoulder internal rotation nt     Shoulder external rotation 18     Elbow flexion wfl     Elbow extension wfl     Wrist flexion      Wrist extension      Wrist ulnar deviation      Wrist radial deviation      Wrist pronation      Wrist supination      (Blank rows =wfl)  UPPER EXTREMITY MMT:  MMT Left eval Left 07/28/23  Shoulder flexion nt 4-  Shoulder extension nt   Shoulder abduction nt 3+  Shoulder adduction nt   Shoulder internal rotation nt 4-  Shoulder external rotation nt 3+  Middle trapezius    Lower trapezius    Elbow flexion 3-   Elbow extension 3-   Wrist flexion    Wrist extension    Wrist ulnar deviation    Wrist radial deviation    Wrist pronation    Wrist supination    Grip strength (lbs)    (Blank rows = not tested)  PALPATION:  Tender, edematous medial upper arm Incision ant upper humerus healing, raised no redness or warmth noted                                                                                                                             TREATMENT DATE:  07/28/23 UBE L2 x 3 min each Goals   MMT  Rows & Lats 20lb 2x10 Shoulder Ext blue 2x10 Horiz abs red 2x10 Triceps Ext 15lb 2x 10 STM to L UT, cervical spine, Deltoid  LUE PROM with end range holds  within protocol limitations  STM to L UT and cervical spine Supine LUE ER/IR 1lb x10 each  07/23/23 UBE L2 x 3 min each Goals STM to L UT, cervical spine, Deltoid  LUE PROM with end range holds  within protocol limitations     GH Jt mobs  Rows & Ext red 2x10 AAROM flex 2x10 2lb  Shoulder abd AROM 2x10  07/20/23 UBE L2 x 3 min each Rows & Lats 15lb 2x10 Shoulder flex & Abd AROM x10  1lb x 10 LUE ER/IR with arm to side 2x10 yellow  15lb triceps Ext 10lb 2x10 LUE PROM with end range holds  within protocol limitations   GH Jt mobs   07/07/23 UBE L2 x 3 min each Rows & Ext ( neutral ) 2x10 LUE ER/IR with arm to side 2x10 yellow  Shoulder flex & Abd AROM x10 Rows & Lats 15lb 2x10 LUE PROM with end range holds  within protocol limitations   GH Jt mobs   07/06/23 UBE L2 x 3 min each ROM Total Disability Score: 38 / 80 = 47.5 % Total SPADI Score: 53 / 130 = 40.8 % AAROM Flex & Abd w/ 1lb dowl 2x10 Shoulder flex & Abd AROM x10 LUE ER/IR with arm to side 2x10 yellow  LUE PROM with end range holds  within protocol limitations   06/24/23 UBE L1 x 3 min each  AAROM Flex & Abd w/ dowl 2x10 Rows & Ext (to neutral) red 2x10 LUE ER/IR with arm to side 2x10 yellow  AROM shoulder abe 2x10 LUE PROM with end range holds  within protocol limitations    06/22/23 UBE L1 x 2 min each AAROM Flex & Abd w/ dowl 2x10 Rows & Ext (to neutral) yellow 2x10 LUE ER/IR with arm to side 2x10 yellow  LUE PROM with end range holds  within protocol limitations    PATIENT EDUCATION: Education details: POC, goals Person educated: Patient and Spouse Education method: Explanation, Demonstration, Tactile cues, Verbal cues, and Handouts Education comprehension: verbalized understanding, returned demonstration, and verbal cues required  HOME EXERCISE PROGRAM: Provided printed directions  ASSESSMENT:  CLINICAL IMPRESSION: Patient is a 68 y.o. female who was treated today by physical therapy due to recovery from L reverse TSA.   She presents with decreased L shoulder ROM, strength and function. She reports that's the MD was pleased.  Objective measures taken today as well as last session. She is progressing well increasing her  LUR ROM and strength.  Continued ex to tolerance , following Dr Rosea protocol. She should benefit from skilled PT to address her recovery, she is well motivated and cooperative.   OBJECTIVE IMPAIRMENTS: decreased activity tolerance, decreased ROM, decreased strength, impaired UE functional use, postural dysfunction, and pain.   ACTIVITY LIMITATIONS: carrying, lifting, sleeping, transfers, bathing, dressing, self feeding, and reach over head  PARTICIPATION LIMITATIONS: meal prep, cleaning, laundry, driving, community activity, and yard work  PERSONAL FACTORS: Age, Behavior pattern, Fitness, Past/current experiences, Time since onset of injury/illness/exacerbation, and 1-2 comorbidities: h/o falls, emphysema are also affecting patient's functional outcome.   REHAB POTENTIAL: Good  CLINICAL DECISION MAKING: Evolving/moderate complexity  EVALUATION COMPLEXITY: Moderate   GOALS: Goals reviewed with patient? Yes  SHORT TERM GOALS: Target date:  2 weeks 06/15/23  IHEP Baseline: Goal status: Met 07/23/23   LONG TERM GOALS: Target date: 08/24/23, 12 weeks  Total Disability Score: 59 / 80 = 73.8 % Total SPADI Score: 105 / 130 = 80.8 %: Improve to 20 % DISABLITY OR LESS  Baseline:  Goal status: On going 07/06/23, 07/23/23 Total Disability Score: 21 / 80 = 26.3 % Total SPADI Score: 33 / 130 = 25.4 %  2.  Active ROM L shoulder elevation 130 or greater Baseline: na yet Goal status: Ongoing 07/06/23, Ongoing 07/22/24  3.  Strength L shoulder flex, abd, IR 5/5, ER 4-/5 Baseline: nt Goal status: Progressing 07/28/23   PLAN:  PT FREQUENCY: 2x/week  PT DURATION: 12 weeks  PLANNED INTERVENTIONS: 97110-Therapeutic exercises, 97530- Therapeutic activity, 97112- Neuromuscular re-education, 97535- Self Care, 02859- Manual therapy, and 97033- Ionotophoresis 4mg /ml Dexamethasone   PLAN FOR NEXT SESSION: continue with stretching PROM, manual techniques and modalities as needed to address pain  management L shoulder   Tanda KANDICE Sorrow, PTA,  07/28/2023, 1:02 PM

## 2023-07-30 ENCOUNTER — Ambulatory Visit: Admitting: Physical Therapy

## 2023-07-30 ENCOUNTER — Encounter: Payer: Self-pay | Admitting: Physical Therapy

## 2023-07-30 DIAGNOSIS — M25612 Stiffness of left shoulder, not elsewhere classified: Secondary | ICD-10-CM | POA: Diagnosis not present

## 2023-07-30 DIAGNOSIS — R29898 Other symptoms and signs involving the musculoskeletal system: Secondary | ICD-10-CM | POA: Diagnosis not present

## 2023-07-30 DIAGNOSIS — M25512 Pain in left shoulder: Secondary | ICD-10-CM

## 2023-07-30 NOTE — Therapy (Signed)
 OUTPATIENT PHYSICAL THERAPY SHOULDER TREATMENT     Patient Name: Gabriella Farrell MRN: [id number] DOB:1955-11-15, 68 y.o., female Today's Date: 07/30/2023  END OF SESSION:  PT End of Session - 07/30/23 1302     Visit Number 11    Date for PT Re-Evaluation 08/24/23    PT Start Time 1300    PT Stop Time 1345    PT Time Calculation (min) 45 min    Activity Tolerance Patient tolerated treatment well    Behavior During Therapy WFL for tasks assessed/performed           Past Medical History:  Diagnosis Date   Anxiety    Arthritis    Cancer (HCC)    lesion on right side of neck   Chronic kidney disease    COPD (chronic obstructive pulmonary disease) (HCC)    Depression    Discoid lupus erythematosus    GERD (gastroesophageal reflux disease)    H/O chronic pancreatitis    H/O ETOH abuse    Hypertension    Multiple pulmonary nodules    Stable per Pulmonology   SIADH (syndrome of inappropriate ADH production) (HCC)    Sleep apnea    no CPAP   Smoker    Past Surgical History:  Procedure Laterality Date   BREAST EXCISIONAL BIOPSY Right    CHOLECYSTECTOMY  2010   open cholecystectomy and also repaired ducts in pancreas   COLONOSCOPY W/ BIOPSIES AND POLYPECTOMY     x 5   ESOPHAGOGASTRODUODENOSCOPY     x 3   EYE SURGERY Bilateral 2020   cataract extractons   HARDWARE REMOVAL Left 04/30/2023   Procedure: REMOVAL, HARDWARE;  Surgeon: Melita Drivers, MD;  Location: [facility];  Service: Orthopedics;  Laterality: Left;   REVERSE SHOULDER ARTHROPLASTY Left 04/30/2023   Procedure: ARTHROPLASTY, SHOULDER, TOTAL, REVERSE;  Surgeon: Melita Drivers, MD;  Location: [facility];  Service: Orthopedics;  Laterality: Left;   SHOULDER SURGERY Left 2016   for Fx Humerus and dislocation   WISDOM TOOTH EXTRACTION     Patient Active Problem List   Diagnosis Date Noted   S/P reverse total shoulder arthroplasty, left 04/30/2023   Centrilobular emphysema (HCC) 04/06/2023   Lung nodules 04/06/2023    Preoperative respiratory examination 04/06/2023   Tobacco use disorder 09/27/2018   Excessive daytime sleepiness 09/27/2018   Snoring 09/27/2018   Anxiety 12/04/2014   Depression 12/04/2014    PCP: Katina Pfeiffer, PA-C  REFERRING PROVIDER: Melita Drivers, MD  REFERRING DIAG: s/p removal of hardware and reverse TSA  THERAPY DIAG:  Acute pain of left shoulder  Stiffness of left shoulder, not elsewhere classified  Weakness of left shoulder  Rationale for Evaluation and Treatment: Rehabilitation  ONSET DATE: 04/30/23  SUBJECTIVE:  SUBJECTIVE STATEMENT: Not great not sure if its allergies but have been coughing an sneezing   Hand dominance: Right  PERTINENT HISTORY: [place] nearly 8 years ago, had fracture L prox humerus, with ORIF, developed traumatic arthritis, underwent reverse TSA  PAIN:  Are you having pain? Yes: NPRS scale: 1/10 Pain location: L anterior shoulder Pain description: throbbing  Aggravating factors: lifting arm Relieving factors: ice, heat, meds, sling  PRECAUTIONS: Shoulder  RED FLAGS: None   WEIGHT BEARING RESTRICTIONS: Yes NWB L UE   FALLS:  Has patient fallen in last 6 months? No  LIVING ENVIRONMENT: Lives with: lives with their spouse Lives in: House/apartment Stairs: No Has following equipment at home: sling   OCCUPATION: retired  PLOF: Independent  PATIENT GOALS:able to move and use L arm without pain  NEXT MD VISIT:   OBJECTIVE:  Note: Objective measures were completed at Evaluation unless otherwise noted.  DIAGNOSTIC FINDINGS:  na  PATIENT SURVEYS:  SPADI Total Disability Score: 59 / 80 = 73.8 % Total SPADI Score: 105 / 130 = 80.8 %   COGNITION: Overall cognitive status: Within functional limits for tasks assessed     SENSATION: Reports  intermittent numbness and weakness L hand and wrist   POSTURE: Very lean female, non tension tremor noted Ue's and LEs  UPPER EXTREMITY ROM:   Passive ROM Left eval Left 06/24/23 AROM Left 07/06/23 AROM Left AROM 07/23/23  Shoulder flexion 88 86 89 110  Shoulder extension nt     Shoulder abduction 60 65 63 80  Shoulder adduction      Shoulder internal rotation nt     Shoulder external rotation 18     Elbow flexion wfl     Elbow extension wfl     Wrist flexion      Wrist extension      Wrist ulnar deviation      Wrist radial deviation      Wrist pronation      Wrist supination      (Blank rows =wfl)  UPPER EXTREMITY MMT:  MMT Left eval Left 07/28/23  Shoulder flexion nt 4-  Shoulder extension nt   Shoulder abduction nt 3+  Shoulder adduction nt   Shoulder internal rotation nt 4-  Shoulder external rotation nt 3+  Middle trapezius    Lower trapezius    Elbow flexion 3-   Elbow extension 3-   Wrist flexion    Wrist extension    Wrist ulnar deviation    Wrist radial deviation    Wrist pronation    Wrist supination    Grip strength (lbs)    (Blank rows = not tested)  PALPATION:  Tender, edematous medial upper arm Incision ant upper humerus healing, raised no redness or warmth noted                                                                                                                             TREATMENT DATE: 07/30/23  UBE L2 x 3 min each Wall flex, Abd, CW/CCW w/ pillow case  LUE ER red 2x10 LUE IR red 2x10 Shoulder Flex 2lb 2x10 Shoulder abd 1lb 2x10 Rows & Lats 20lb 2x10 Shoulder Ext 5lb 2x5 Triceps Ext 10lb 2x10 2 level cabinet reaches  Biceps curls 2lb 2x10   07/28/23 UBE L2 x 3 min each Goals   MMT  Rows & Lats 20lb 2x10 Shoulder Ext blue 2x10 Horiz abs red 2x10 Triceps Ext 15lb 2x 10 STM to L UT, cervical spine, Deltoid  LUE PROM with end range holds  within protocol limitations  STM to L UT and cervical spine Supine LUE ER/IR 1lb x10  each  07/23/23 UBE L2 x 3 min each Goals STM to L UT, cervical spine, Deltoid  LUE PROM with end range holds  within protocol limitations    GH Jt mobs  Rows & Ext red 2x10 AAROM flex 2x10 2lb  Shoulder abd AROM 2x10  07/20/23 UBE L2 x 3 min each Rows & Lats 15lb 2x10 Shoulder flex & Abd AROM x10  1lb x 10 LUE ER/IR with arm to side 2x10 yellow  15lb triceps Ext 10lb 2x10 LUE PROM with end range holds  within protocol limitations   GH Jt mobs   07/07/23 UBE L2 x 3 min each Rows & Ext ( neutral ) 2x10 LUE ER/IR with arm to side 2x10 yellow  Shoulder flex & Abd AROM x10 Rows & Lats 15lb 2x10 LUE PROM with end range holds  within protocol limitations   GH Jt mobs   07/06/23 UBE L2 x 3 min each ROM Total Disability Score: 38 / 80 = 47.5 % Total SPADI Score: 53 / 130 = 40.8 % AAROM Flex & Abd w/ 1lb dowl 2x10 Shoulder flex & Abd AROM x10 LUE ER/IR with arm to side 2x10 yellow  LUE PROM with end range holds  within protocol limitations   06/24/23 UBE L1 x 3 min each  AAROM Flex & Abd w/ dowl 2x10 Rows & Ext (to neutral) red 2x10 LUE ER/IR with arm to side 2x10 yellow  AROM shoulder abe 2x10 LUE PROM with end range holds  within protocol limitations    06/22/23 UBE L1 x 2 min each AAROM Flex & Abd w/ dowl 2x10 Rows & Ext (to neutral) yellow 2x10 LUE ER/IR with arm to side 2x10 yellow  LUE PROM with end range holds  within protocol limitations    PATIENT EDUCATION: Education details: POC, goals Person educated: Patient and Spouse Education method: Explanation, Demonstration, Tactile cues, Verbal cues, and Handouts Education comprehension: verbalized understanding, returned demonstration, and verbal cues required  HOME EXERCISE PROGRAM: Provided printed directions  ASSESSMENT:  CLINICAL IMPRESSION: Patient is a 68 y.o. female who was treated today by physical therapy due to recovery from L reverse TSA.   She presents with decreased L shoulder ROM, strength and  function. Continued to exercises to tolerance tolerance following Dr Rosea protocol. Session focused moe on functional strength L shoulder elevation with flexion and abduction.She should benefit from skilled PT to address her recovery, she is well motivated and cooperative.   OBJECTIVE IMPAIRMENTS: decreased activity tolerance, decreased ROM, decreased strength, impaired UE functional use, postural dysfunction, and pain.   ACTIVITY LIMITATIONS: carrying, lifting, sleeping, transfers, bathing, dressing, self feeding, and reach over head  PARTICIPATION LIMITATIONS: meal prep, cleaning, laundry, driving, community activity, and yard work  PERSONAL FACTORS: Age, Behavior pattern, Fitness, Past/current experiences, Time since onset of injury/illness/exacerbation, and 1-2  comorbidities: h/o falls, emphysema are also affecting patient's functional outcome.   REHAB POTENTIAL: Good  CLINICAL DECISION MAKING: Evolving/moderate complexity  EVALUATION COMPLEXITY: Moderate   GOALS: Goals reviewed with patient? Yes  SHORT TERM GOALS: Target date: 2 weeks 06/15/23  IHEP Baseline: Goal status: Met 07/23/23   LONG TERM GOALS: Target date: 08/24/23, 12 weeks  Total Disability Score: 59 / 80 = 73.8 % Total SPADI Score: 105 / 130 = 80.8 %: Improve to 20 % DISABLITY OR LESS  Baseline:  Goal status: On going 07/06/23, 07/23/23 Total Disability Score: 21 / 80 = 26.3 % Total SPADI Score: 33 / 130 = 25.4 %  2.  Active ROM L shoulder elevation 130 or greater Baseline: na yet Goal status: Ongoing 07/06/23, Ongoing 07/22/24  3.  Strength L shoulder flex, abd, IR 5/5, ER 4-/5 Baseline: nt Goal status: Progressing 07/28/23   PLAN:  PT FREQUENCY: 2x/week  PT DURATION: 12 weeks  PLANNED INTERVENTIONS: 97110-Therapeutic exercises, 97530- Therapeutic activity, 97112- Neuromuscular re-education, 97535- Self Care, 02859- Manual therapy, and 97033- Ionotophoresis 4mg /ml Dexamethasone   PLAN FOR NEXT SESSION:  continue with stretching PROM, manual techniques and modalities as needed to address pain management L shoulder   Tanda KANDICE Sorrow, PTA,  07/30/2023, 1:02 PM

## 2023-08-03 DIAGNOSIS — M810 Age-related osteoporosis without current pathological fracture: Secondary | ICD-10-CM | POA: Diagnosis not present

## 2023-08-03 DIAGNOSIS — Z681 Body mass index (BMI) 19 or less, adult: Secondary | ICD-10-CM | POA: Diagnosis not present

## 2023-08-03 DIAGNOSIS — R053 Chronic cough: Secondary | ICD-10-CM | POA: Diagnosis not present

## 2023-08-12 ENCOUNTER — Ambulatory Visit: Attending: Orthopedic Surgery | Admitting: Physical Therapy

## 2023-08-12 ENCOUNTER — Encounter: Payer: Self-pay | Admitting: Physical Therapy

## 2023-08-12 DIAGNOSIS — R29898 Other symptoms and signs involving the musculoskeletal system: Secondary | ICD-10-CM | POA: Insufficient documentation

## 2023-08-12 DIAGNOSIS — M25612 Stiffness of left shoulder, not elsewhere classified: Secondary | ICD-10-CM | POA: Diagnosis not present

## 2023-08-12 DIAGNOSIS — M25512 Pain in left shoulder: Secondary | ICD-10-CM | POA: Diagnosis not present

## 2023-08-12 NOTE — Therapy (Signed)
 OUTPATIENT PHYSICAL THERAPY SHOULDER TREATMENT     Patient Name: Gabriella Farrell MRN: [id number] DOB:1955/04/22, 68 y.o., female Today's Date: 08/12/2023  END OF SESSION:  PT End of Session - 08/12/23 1303     Visit Number 12    Date for PT Re-Evaluation 08/24/23    PT Start Time 1300    PT Stop Time 1345    PT Time Calculation (min) 45 min    Activity Tolerance Patient tolerated treatment well    Behavior During Therapy WFL for tasks assessed/performed           Past Medical History:  Diagnosis Date   Anxiety    Arthritis    Cancer (HCC)    lesion on right side of neck   Chronic kidney disease    COPD (chronic obstructive pulmonary disease) (HCC)    Depression    Discoid lupus erythematosus    GERD (gastroesophageal reflux disease)    H/O chronic pancreatitis    H/O ETOH abuse    Hypertension    Multiple pulmonary nodules    Stable per Pulmonology   SIADH (syndrome of inappropriate ADH production) (HCC)    Sleep apnea    no CPAP   Smoker    Past Surgical History:  Procedure Laterality Date   BREAST EXCISIONAL BIOPSY Right    CHOLECYSTECTOMY  2010   open cholecystectomy and also repaired ducts in pancreas   COLONOSCOPY W/ BIOPSIES AND POLYPECTOMY     x 5   ESOPHAGOGASTRODUODENOSCOPY     x 3   EYE SURGERY Bilateral 2020   cataract extractons   HARDWARE REMOVAL Left 04/30/2023   Procedure: REMOVAL, HARDWARE;  Surgeon: Melita Drivers, MD;  Location: [facility];  Service: Orthopedics;  Laterality: Left;   REVERSE SHOULDER ARTHROPLASTY Left 04/30/2023   Procedure: ARTHROPLASTY, SHOULDER, TOTAL, REVERSE;  Surgeon: Melita Drivers, MD;  Location: [facility];  Service: Orthopedics;  Laterality: Left;   SHOULDER SURGERY Left 2016   for Fx Humerus and dislocation   WISDOM TOOTH EXTRACTION     Patient Active Problem List   Diagnosis Date Noted   S/P reverse total shoulder arthroplasty, left 04/30/2023   Centrilobular emphysema (HCC) 04/06/2023   Lung nodules 04/06/2023    Preoperative respiratory examination 04/06/2023   Tobacco use disorder 09/27/2018   Excessive daytime sleepiness 09/27/2018   Snoring 09/27/2018   Anxiety 12/04/2014   Depression 12/04/2014    PCP: Katina Pfeiffer, PA-C  REFERRING PROVIDER: Melita Drivers, MD  REFERRING DIAG: s/p removal of hardware and reverse TSA  THERAPY DIAG:  Acute pain of left shoulder  Stiffness of left shoulder, not elsewhere classified  Weakness of left shoulder  Rationale for Evaluation and Treatment: Rehabilitation  ONSET DATE: 04/30/23  SUBJECTIVE:  SUBJECTIVE STATEMENT: Was doing good up until yesterday, woke up yesterday morning with L arm pain and wrist weakness, today is slightly better  Hand dominance: Right  PERTINENT HISTORY: [place] nearly 8 years ago, had fracture L prox humerus, with ORIF, developed traumatic arthritis, underwent reverse TSA  PAIN:  Are you having pain? Yes: NPRS scale: 1/10 Pain location: L anterior shoulder Pain description: throbbing  Aggravating factors: lifting arm Relieving factors: ice, heat, meds, sling  PRECAUTIONS: Shoulder  RED FLAGS: None   WEIGHT BEARING RESTRICTIONS: Yes NWB L UE   FALLS:  Has patient fallen in last 6 months? No  LIVING ENVIRONMENT: Lives with: lives with their spouse Lives in: House/apartment Stairs: No Has following equipment at home: sling   OCCUPATION: retired  PLOF: Independent  PATIENT GOALS:able to move and use L arm without pain  NEXT MD VISIT:   OBJECTIVE:  Note: Objective measures were completed at Evaluation unless otherwise noted.  DIAGNOSTIC FINDINGS:  na  PATIENT SURVEYS:  SPADI Total Disability Score: 59 / 80 = 73.8 % Total SPADI Score: 105 / 130 = 80.8 %   COGNITION: Overall cognitive status: Within functional  limits for tasks assessed     SENSATION: Reports intermittent numbness and weakness L hand and wrist   POSTURE: Very lean female, non tension tremor noted Ue's and LEs  UPPER EXTREMITY ROM:   Passive ROM Left eval Left 06/24/23 AROM Left 07/06/23 AROM Left AROM 07/23/23 Left AROM 08/12/23  Shoulder flexion 88 86 89 110 119  Shoulder extension nt      Shoulder abduction 60 65 63 80 89  Shoulder adduction       Shoulder internal rotation nt      Shoulder external rotation 18      Elbow flexion wfl      Elbow extension wfl      Wrist flexion       Wrist extension       Wrist ulnar deviation       Wrist radial deviation       Wrist pronation       Wrist supination       (Blank rows =wfl)  UPPER EXTREMITY MMT:  MMT Left eval Left 07/28/23  Shoulder flexion nt 4-  Shoulder extension nt   Shoulder abduction nt 3+  Shoulder adduction nt   Shoulder internal rotation nt 4-  Shoulder external rotation nt 3+  Middle trapezius    Lower trapezius    Elbow flexion 3-   Elbow extension 3-   Wrist flexion    Wrist extension    Wrist ulnar deviation    Wrist radial deviation    Wrist pronation    Wrist supination    Grip strength (lbs)    (Blank rows = not tested)  PALPATION:  Tender, edematous medial upper arm Incision ant upper humerus healing, raised no redness or warmth noted  TREATMENT DATE: 08/12/23 UBE L 2 x3 min each  Shoulder Flex, Ext, IR 2lb WaTE x10 AAROM Shoulder Flex 2lb 2x10 Shoulder abd 1lb 2x10 Rows & Lats 20lb 2x12  Triceps Ext 15lb 2x10 Biceps curls 2lb 2x10  2 level cabinet reaches   07/30/23 UBE L2 x 3 min each Wall flex, Abd, CW/CCW w/ pillow case  LUE ER red 2x10 LUE IR red 2x10 Shoulder Flex 2lb 2x10 Shoulder abd 1lb 2x10 Rows & Lats 20lb 2x10 Shoulder Ext 5lb 2x5 Triceps Ext 10lb 2x10 2 level cabinet reaches  Biceps  curls 2lb 2x10   07/28/23 UBE L2 x 3 min each Goals   MMT  Rows & Lats 20lb 2x10 Shoulder Ext blue 2x10 Horiz abs red 2x10 Triceps Ext 15lb 2x 10 STM to L UT, cervical spine, Deltoid  LUE PROM with end range holds  within protocol limitations  STM to L UT and cervical spine Supine LUE ER/IR 1lb x10 each  07/23/23 UBE L2 x 3 min each Goals STM to L UT, cervical spine, Deltoid  LUE PROM with end range holds  within protocol limitations    GH Jt mobs  Rows & Ext red 2x10 AAROM flex 2x10 2lb  Shoulder abd AROM 2x10  07/20/23 UBE L2 x 3 min each Rows & Lats 15lb 2x10 Shoulder flex & Abd AROM x10  1lb x 10 LUE ER/IR with arm to side 2x10 yellow  15lb triceps Ext 10lb 2x10 LUE PROM with end range holds  within protocol limitations   GH Jt mobs   07/07/23 UBE L2 x 3 min each Rows & Ext ( neutral ) 2x10 LUE ER/IR with arm to side 2x10 yellow  Shoulder flex & Abd AROM x10 Rows & Lats 15lb 2x10 LUE PROM with end range holds  within protocol limitations   GH Jt mobs   07/06/23 UBE L2 x 3 min each ROM Total Disability Score: 38 / 80 = 47.5 % Total SPADI Score: 53 / 130 = 40.8 % AAROM Flex & Abd w/ 1lb dowl 2x10 Shoulder flex & Abd AROM x10 LUE ER/IR with arm to side 2x10 yellow  LUE PROM with end range holds  within protocol limitations    PATIENT EDUCATION: Education details: POC, goals Person educated: Patient and Spouse Education method: Explanation, Demonstration, Tactile cues, Verbal cues, and Handouts Education comprehension: verbalized understanding, returned demonstration, and verbal cues required  HOME EXERCISE PROGRAM: Provided printed directions  ASSESSMENT:  CLINICAL IMPRESSION: Patient is a 68 y.o. female who was treated today by physical therapy due to recovery from L reverse TSA.   She presents with decreased L shoulder ROM, strength and function. She has progressed increasing her L shoulder AROM. Continued to exercises to tolerance tolerance following Dr  Rosea protocol. Session focused moe on functional strength L shoulder elevation with flexion and abduction.She should benefit from skilled PT to address her recovery, she is well motivated and cooperative.   OBJECTIVE IMPAIRMENTS: decreased activity tolerance, decreased ROM, decreased strength, impaired UE functional use, postural dysfunction, and pain.   ACTIVITY LIMITATIONS: carrying, lifting, sleeping, transfers, bathing, dressing, self feeding, and reach over head  PARTICIPATION LIMITATIONS: meal prep, cleaning, laundry, driving, community activity, and yard work  PERSONAL FACTORS: Age, Behavior pattern, Fitness, Past/current experiences, Time since onset of injury/illness/exacerbation, and 1-2 comorbidities: h/o falls, emphysema are also affecting patient's functional outcome.   REHAB POTENTIAL: Good  CLINICAL DECISION MAKING: Evolving/moderate complexity  EVALUATION COMPLEXITY: Moderate   GOALS: Goals reviewed with patient? Yes  SHORT TERM GOALS: Target date: 2 weeks 06/15/23  IHEP Baseline: Goal status: Met 07/23/23   LONG TERM GOALS: Target date: 08/24/23, 12 weeks  Total Disability Score: 59 / 80 = 73.8 % Total SPADI Score: 105 / 130 = 80.8 %: Improve to 20 % DISABLITY OR LESS  Baseline:  Goal status: On going 07/06/23, 07/23/23 Total Disability Score: 21 / 80 = 26.3 % Total SPADI Score: 33 / 130 = 25.4 %  2.  Active ROM L shoulder elevation 130 or greater Baseline: na yet Goal status: Ongoing 07/06/23, Ongoing 07/22/24, Progressing 08/12/23  3.  Strength L shoulder flex, abd, IR 5/5, ER 4-/5 Baseline: nt Goal status: Progressing 07/28/23   PLAN:  PT FREQUENCY: 2x/week  PT DURATION: 12 weeks  PLANNED INTERVENTIONS: 97110-Therapeutic exercises, 97530- Therapeutic activity, 97112- Neuromuscular re-education, 97535- Self Care, 02859- Manual therapy, and 97033- Ionotophoresis 4mg /ml Dexamethasone   PLAN FOR NEXT SESSION: continue with stretching PROM, manual techniques  and modalities as needed to address pain management L shoulder   Tanda KANDICE Sorrow, PTA,  08/12/2023, 1:03 PM

## 2023-08-14 ENCOUNTER — Ambulatory Visit: Admitting: Physical Therapy

## 2023-08-14 ENCOUNTER — Encounter: Payer: Self-pay | Admitting: Physical Therapy

## 2023-08-14 DIAGNOSIS — R29898 Other symptoms and signs involving the musculoskeletal system: Secondary | ICD-10-CM

## 2023-08-14 DIAGNOSIS — M25612 Stiffness of left shoulder, not elsewhere classified: Secondary | ICD-10-CM | POA: Diagnosis not present

## 2023-08-14 DIAGNOSIS — M25512 Pain in left shoulder: Secondary | ICD-10-CM | POA: Diagnosis not present

## 2023-08-14 NOTE — Therapy (Signed)
 OUTPATIENT PHYSICAL THERAPY SHOULDER TREATMENT     Patient Name: Gabriella Farrell MRN: [id number] DOB:1955/05/08, 68 y.o., female Today's Date: 08/14/2023  END OF SESSION:  PT End of Session - 08/14/23 1059     Visit Number 13    Date for PT Re-Evaluation 08/24/23    PT Start Time 1100    PT Stop Time 1145    PT Time Calculation (min) 45 min    Activity Tolerance Patient tolerated treatment well    Behavior During Therapy WFL for tasks assessed/performed           Past Medical History:  Diagnosis Date   Anxiety    Arthritis    Cancer (HCC)    lesion on right side of neck   Chronic kidney disease    COPD (chronic obstructive pulmonary disease) (HCC)    Depression    Discoid lupus erythematosus    GERD (gastroesophageal reflux disease)    H/O chronic pancreatitis    H/O ETOH abuse    Hypertension    Multiple pulmonary nodules    Stable per Pulmonology   SIADH (syndrome of inappropriate ADH production) (HCC)    Sleep apnea    no CPAP   Smoker    Past Surgical History:  Procedure Laterality Date   BREAST EXCISIONAL BIOPSY Right    CHOLECYSTECTOMY  2010   open cholecystectomy and also repaired ducts in pancreas   COLONOSCOPY W/ BIOPSIES AND POLYPECTOMY     x 5   ESOPHAGOGASTRODUODENOSCOPY     x 3   EYE SURGERY Bilateral 2020   cataract extractons   HARDWARE REMOVAL Left 04/30/2023   Procedure: REMOVAL, HARDWARE;  Surgeon: Melita Drivers, MD;  Location: [facility];  Service: Orthopedics;  Laterality: Left;   REVERSE SHOULDER ARTHROPLASTY Left 04/30/2023   Procedure: ARTHROPLASTY, SHOULDER, TOTAL, REVERSE;  Surgeon: Melita Drivers, MD;  Location: [facility];  Service: Orthopedics;  Laterality: Left;   SHOULDER SURGERY Left 2016   for Fx Humerus and dislocation   WISDOM TOOTH EXTRACTION     Patient Active Problem List   Diagnosis Date Noted   S/P reverse total shoulder arthroplasty, left 04/30/2023   Centrilobular emphysema (HCC) 04/06/2023   Lung nodules 04/06/2023    Preoperative respiratory examination 04/06/2023   Tobacco use disorder 09/27/2018   Excessive daytime sleepiness 09/27/2018   Snoring 09/27/2018   Anxiety 12/04/2014   Depression 12/04/2014    PCP: Katina Pfeiffer, PA-C  REFERRING PROVIDER: Melita Drivers, MD  REFERRING DIAG: s/p removal of hardware and reverse TSA  THERAPY DIAG:  Acute pain of left shoulder  Stiffness of left shoulder, not elsewhere classified  Weakness of left shoulder  Rationale for Evaluation and Treatment: Rehabilitation  ONSET DATE: 04/30/23  SUBJECTIVE:  SUBJECTIVE STATEMENT: Little neck pain and discomfort  Hand dominance: Right  PERTINENT HISTORY: [place] nearly 8 years ago, had fracture L prox humerus, with ORIF, developed traumatic arthritis, underwent reverse TSA  PAIN:  Are you having pain? Yes: NPRS scale: 2/10 Pain location: L anterior shoulder Pain description: throbbing  Aggravating factors: lifting arm Relieving factors: ice, heat, meds, sling  PRECAUTIONS: Shoulder  RED FLAGS: None   WEIGHT BEARING RESTRICTIONS: Yes NWB L UE   FALLS:  Has patient fallen in last 6 months? No  LIVING ENVIRONMENT: Lives with: lives with their spouse Lives in: House/apartment Stairs: No Has following equipment at home: sling   OCCUPATION: retired  PLOF: Independent  PATIENT GOALS:able to move and use L arm without pain  NEXT MD VISIT:   OBJECTIVE:  Note: Objective measures were completed at Evaluation unless otherwise noted.  DIAGNOSTIC FINDINGS:  na  PATIENT SURVEYS:  SPADI Total Disability Score: 59 / 80 = 73.8 % Total SPADI Score: 105 / 130 = 80.8 %   COGNITION: Overall cognitive status: Within functional limits for tasks assessed     SENSATION: Reports intermittent numbness and weakness L hand  and wrist   POSTURE: Very lean female, non tension tremor noted Ue's and LEs  UPPER EXTREMITY ROM:   Passive ROM Left eval Left 06/24/23 AROM Left 07/06/23 AROM Left AROM 07/23/23 Left AROM 08/12/23  Shoulder flexion 88 86 89 110 119  Shoulder extension nt      Shoulder abduction 60 65 63 80 89  Shoulder adduction       Shoulder internal rotation nt      Shoulder external rotation 18      Elbow flexion wfl      Elbow extension wfl      Wrist flexion       Wrist extension       Wrist ulnar deviation       Wrist radial deviation       Wrist pronation       Wrist supination       (Blank rows =wfl)  UPPER EXTREMITY MMT:  MMT Left eval Left 07/28/23  Shoulder flexion nt 4-  Shoulder extension nt   Shoulder abduction nt 3+  Shoulder adduction nt   Shoulder internal rotation nt 4-  Shoulder external rotation nt 3+  Middle trapezius    Lower trapezius    Elbow flexion 3-   Elbow extension 3-   Wrist flexion    Wrist extension    Wrist ulnar deviation    Wrist radial deviation    Wrist pronation    Wrist supination    Grip strength (lbs)    (Blank rows = not tested)  PALPATION:  Tender, edematous medial upper arm Incision ant upper humerus healing, raised no redness or warmth noted  TREATMENT DATE: 08/14/23 UBE L 2 x3 min each  Shoulder Flex 2lb 2x10 Shoulder abd 1lb 2x10 LUE ER & IR yellow 2x10 Rows blue 2x15 Shoulder Ext red 2x10 Rows & Lats 20lb 2x12  STM to bilateral UT and cervical spine  08/12/23 UBE L 2 x3 min each  Shoulder Flex, Ext, IR 2lb WaTE x10 AAROM Shoulder Flex 2lb 2x10 Shoulder abd 1lb 2x10 Rows & Lats 20lb 2x12  Triceps Ext 15lb 2x10 Biceps curls 2lb 2x10  2 level cabinet reaches   07/30/23 UBE L2 x 3 min each Wall flex, Abd, CW/CCW w/ pillow case  LUE ER red 2x10 LUE IR red 2x10 Shoulder Flex 2lb 2x10 Shoulder abd  1lb 2x10 Rows & Lats 20lb 2x10 Shoulder Ext 5lb 2x5 Triceps Ext 10lb 2x10 2 level cabinet reaches  Biceps curls 2lb 2x10   07/28/23 UBE L2 x 3 min each Goals   MMT  Rows & Lats 20lb 2x10 Shoulder Ext blue 2x10 Horiz abs red 2x10 Triceps Ext 15lb 2x 10 STM to L UT, cervical spine, Deltoid  LUE PROM with end range holds  within protocol limitations  STM to L UT and cervical spine Supine LUE ER/IR 1lb x10 each  07/23/23 UBE L2 x 3 min each Goals STM to L UT, cervical spine, Deltoid  LUE PROM with end range holds  within protocol limitations    GH Jt mobs  Rows & Ext red 2x10 AAROM flex 2x10 2lb  Shoulder abd AROM 2x10  07/20/23 UBE L2 x 3 min each Rows & Lats 15lb 2x10 Shoulder flex & Abd AROM x10  1lb x 10 LUE ER/IR with arm to side 2x10 yellow  15lb triceps Ext 10lb 2x10 LUE PROM with end range holds  within protocol limitations   GH Jt mobs   07/07/23 UBE L2 x 3 min each Rows & Ext ( neutral ) 2x10 LUE ER/IR with arm to side 2x10 yellow  Shoulder flex & Abd AROM x10 Rows & Lats 15lb 2x10 LUE PROM with end range holds  within protocol limitations   GH Jt mobs   07/06/23 UBE L2 x 3 min each ROM Total Disability Score: 38 / 80 = 47.5 % Total SPADI Score: 53 / 130 = 40.8 % AAROM Flex & Abd w/ 1lb dowl 2x10 Shoulder flex & Abd AROM x10 LUE ER/IR with arm to side 2x10 yellow  LUE PROM with end range holds  within protocol limitations    PATIENT EDUCATION: Education details: POC, goals Person educated: Patient and Spouse Education method: Explanation, Demonstration, Tactile cues, Verbal cues, and Handouts Education comprehension: verbalized understanding, returned demonstration, and verbal cues required  HOME EXERCISE PROGRAM: Provided printed directions  ASSESSMENT:  CLINICAL IMPRESSION: Patient is a 68 y.o. female who was treated today by physical therapy due to recovery from L reverse TSA.   She presents with decreased L shoulder ROM, strength and  function.  Continued to exercises to tolerance tolerance following Dr Rosea protocol. Session focused on functional strength L shoulder elevation with flexion and abduction remains. Postural cues required with rows and ext. Positive response to STM evident by improved tissue elasticity and mobility.  She should benefit from skilled PT to address her recovery, she is well motivated and cooperative.   OBJECTIVE IMPAIRMENTS: decreased activity tolerance, decreased ROM, decreased strength, impaired UE functional use, postural dysfunction, and pain.   ACTIVITY LIMITATIONS: carrying, lifting, sleeping, transfers, bathing, dressing, self feeding, and reach over head  PARTICIPATION LIMITATIONS: meal prep, cleaning, laundry,  driving, community activity, and yard work  PERSONAL FACTORS: Age, Behavior pattern, Fitness, Past/current experiences, Time since onset of injury/illness/exacerbation, and 1-2 comorbidities: h/o falls, emphysema are also affecting patient's functional outcome.   REHAB POTENTIAL: Good  CLINICAL DECISION MAKING: Evolving/moderate complexity  EVALUATION COMPLEXITY: Moderate   GOALS: Goals reviewed with patient? Yes  SHORT TERM GOALS: Target date: 2 weeks 06/15/23  IHEP Baseline: Goal status: Met 07/23/23   LONG TERM GOALS: Target date: 08/24/23, 12 weeks  Total Disability Score: 59 / 80 = 73.8 % Total SPADI Score: 105 / 130 = 80.8 %: Improve to 20 % DISABLITY OR LESS  Baseline:  Goal status: On going 07/06/23, 07/23/23 Total Disability Score: 21 / 80 = 26.3 % Total SPADI Score: 33 / 130 = 25.4 %  2.  Active ROM L shoulder elevation 130 or greater Baseline: na yet Goal status: Ongoing 07/06/23, Ongoing 07/22/24, Progressing 08/12/23  3.  Strength L shoulder flex, abd, IR 5/5, ER 4-/5 Baseline: nt Goal status: Progressing 07/28/23   PLAN:  PT FREQUENCY: 2x/week  PT DURATION: 12 weeks  PLANNED INTERVENTIONS: 97110-Therapeutic exercises, 97530- Therapeutic activity,  97112- Neuromuscular re-education, 97535- Self Care, 02859- Manual therapy, and 97033- Ionotophoresis 4mg /ml Dexamethasone   PLAN FOR NEXT SESSION: continue with stretching PROM, manual techniques and modalities as needed to address pain management L shoulder   Tanda KANDICE Sorrow, PTA,  08/14/2023, 11:01 AM

## 2023-08-17 ENCOUNTER — Ambulatory Visit: Admitting: Physical Therapy

## 2023-08-17 ENCOUNTER — Encounter: Payer: Self-pay | Admitting: Physical Therapy

## 2023-08-17 DIAGNOSIS — M25512 Pain in left shoulder: Secondary | ICD-10-CM

## 2023-08-17 DIAGNOSIS — M25612 Stiffness of left shoulder, not elsewhere classified: Secondary | ICD-10-CM

## 2023-08-17 DIAGNOSIS — R29898 Other symptoms and signs involving the musculoskeletal system: Secondary | ICD-10-CM | POA: Diagnosis not present

## 2023-08-17 NOTE — Therapy (Signed)
 OUTPATIENT PHYSICAL THERAPY SHOULDER TREATMENT     Patient Name: Gabriella Farrell MRN: [id number] DOB:06-21-1955, 68 y.o., female Today's Date: 08/17/2023  END OF SESSION:  PT End of Session - 08/17/23 1518     Visit Number 14    Date for PT Re-Evaluation 08/24/23    PT Start Time 1515    PT Stop Time 1600    PT Time Calculation (min) 45 min    Activity Tolerance Patient tolerated treatment well    Behavior During Therapy WFL for tasks assessed/performed           Past Medical History:  Diagnosis Date   Anxiety    Arthritis    Cancer (HCC)    lesion on right side of neck   Chronic kidney disease    COPD (chronic obstructive pulmonary disease) (HCC)    Depression    Discoid lupus erythematosus    GERD (gastroesophageal reflux disease)    H/O chronic pancreatitis    H/O ETOH abuse    Hypertension    Multiple pulmonary nodules    Stable per Pulmonology   SIADH (syndrome of inappropriate ADH production) (HCC)    Sleep apnea    no CPAP   Smoker    Past Surgical History:  Procedure Laterality Date   BREAST EXCISIONAL BIOPSY Right    CHOLECYSTECTOMY  2010   open cholecystectomy and also repaired ducts in pancreas   COLONOSCOPY W/ BIOPSIES AND POLYPECTOMY     x 5   ESOPHAGOGASTRODUODENOSCOPY     x 3   EYE SURGERY Bilateral 2020   cataract extractons   HARDWARE REMOVAL Left 04/30/2023   Procedure: REMOVAL, HARDWARE;  Surgeon: Melita Drivers, MD;  Location: [facility];  Service: Orthopedics;  Laterality: Left;   REVERSE SHOULDER ARTHROPLASTY Left 04/30/2023   Procedure: ARTHROPLASTY, SHOULDER, TOTAL, REVERSE;  Surgeon: Melita Drivers, MD;  Location: [facility];  Service: Orthopedics;  Laterality: Left;   SHOULDER SURGERY Left 2016   for Fx Humerus and dislocation   WISDOM TOOTH EXTRACTION     Patient Active Problem List   Diagnosis Date Noted   S/P reverse total shoulder arthroplasty, left 04/30/2023   Centrilobular emphysema (HCC) 04/06/2023   Lung nodules 04/06/2023    Preoperative respiratory examination 04/06/2023   Tobacco use disorder 09/27/2018   Excessive daytime sleepiness 09/27/2018   Snoring 09/27/2018   Anxiety 12/04/2014   Depression 12/04/2014    PCP: Katina Pfeiffer, PA-C  REFERRING PROVIDER: Melita Drivers, MD  REFERRING DIAG: s/p removal of hardware and reverse TSA  THERAPY DIAG:  Acute pain of left shoulder  Stiffness of left shoulder, not elsewhere classified  Weakness of left shoulder  Rationale for Evaluation and Treatment: Rehabilitation  ONSET DATE: 04/30/23  SUBJECTIVE:  SUBJECTIVE STATEMENT: Be kind Slept in a different bed woke up with lateral L shoulder soreness  Hand dominance: Right  PERTINENT HISTORY: [place] nearly 8 years ago, had fracture L prox humerus, with ORIF, developed traumatic arthritis, underwent reverse TSA  PAIN:  Are you having pain? Yes: NPRS scale: 2/10 Pain location: L anterior shoulder Pain description: throbbing  Aggravating factors: lifting arm Relieving factors: ice, heat, meds, sling  PRECAUTIONS: Shoulder  RED FLAGS: None   WEIGHT BEARING RESTRICTIONS: Yes NWB L UE   FALLS:  Has patient fallen in last 6 months? No  LIVING ENVIRONMENT: Lives with: lives with their spouse Lives in: House/apartment Stairs: No Has following equipment at home: sling   OCCUPATION: retired  PLOF: Independent  PATIENT GOALS:able to move and use L arm without pain  NEXT MD VISIT:   OBJECTIVE:  Note: Objective measures were completed at Evaluation unless otherwise noted.  DIAGNOSTIC FINDINGS:  na  PATIENT SURVEYS:  SPADI Total Disability Score: 59 / 80 = 73.8 % Total SPADI Score: 105 / 130 = 80.8 %   COGNITION: Overall cognitive status: Within functional limits for tasks  assessed     SENSATION: Reports intermittent numbness and weakness L hand and wrist   POSTURE: Very lean female, non tension tremor noted Ue's and LEs  UPPER EXTREMITY ROM:   Passive ROM Left eval Left 06/24/23 AROM Left 07/06/23 AROM Left AROM 07/23/23 Left AROM 08/12/23  Shoulder flexion 88 86 89 110 119  Shoulder extension nt      Shoulder abduction 60 65 63 80 89  Shoulder adduction       Shoulder internal rotation nt      Shoulder external rotation 18      Elbow flexion wfl      Elbow extension wfl      Wrist flexion       Wrist extension       Wrist ulnar deviation       Wrist radial deviation       Wrist pronation       Wrist supination       (Blank rows =wfl)  UPPER EXTREMITY MMT:  MMT Left eval Left 07/28/23  Shoulder flexion nt 4-  Shoulder extension nt   Shoulder abduction nt 3+  Shoulder adduction nt   Shoulder internal rotation nt 4-  Shoulder external rotation nt 3+  Middle trapezius    Lower trapezius    Elbow flexion 3-   Elbow extension 3-   Wrist flexion    Wrist extension    Wrist ulnar deviation    Wrist radial deviation    Wrist pronation    Wrist supination    Grip strength (lbs)    (Blank rows = not tested)  PALPATION:  Tender, edematous medial upper arm Incision ant upper humerus healing, raised no redness or warmth noted  TREATMENT DATE: 08/17/23 UBE L 2.5 x3 min each  Bilat shoulder Flex 3lb WaTE 2x10 LUE ER & IR red 2x10 Rows blue 2x10 Horiz abd red 2x10 Triceps Ext 15lb 2x10 Biceps curls 2lb 2x10 LUE PROM with end range holds  within protocol limitations   GH Jt mobs  Shoulder abd 2x10  08/14/23 UBE L 2 x3 min each  Shoulder Flex 2lb 2x10 Shoulder abd 1lb 2x10 LUE ER & IR yellow 2x10 Rows blue 2x15 Shoulder Ext red 2x10 Rows & Lats 20lb 2x12  STM to bilateral UT and cervical spine  08/12/23 UBE L 2  x3 min each  Shoulder Flex, Ext, IR 2lb WaTE x10 AAROM Shoulder Flex 2lb 2x10 Shoulder abd 1lb 2x10 Rows & Lats 20lb 2x12  Triceps Ext 15lb 2x10 Biceps curls 2lb 2x10  2 level cabinet reaches   07/30/23 UBE L2 x 3 min each Wall flex, Abd, CW/CCW w/ pillow case  LUE ER red 2x10 LUE IR red 2x10 Shoulder Flex 2lb 2x10 Shoulder abd 1lb 2x10 Rows & Lats 20lb 2x10 Shoulder Ext 5lb 2x5 Triceps Ext 10lb 2x10 2 level cabinet reaches  Biceps curls 2lb 2x10   07/28/23 UBE L2 x 3 min each Goals   MMT  Rows & Lats 20lb 2x10 Shoulder Ext blue 2x10 Horiz abs red 2x10 Triceps Ext 15lb 2x 10 STM to L UT, cervical spine, Deltoid  LUE PROM with end range holds  within protocol limitations  STM to L UT and cervical spine Supine LUE ER/IR 1lb x10 each  07/23/23 UBE L2 x 3 min each Goals STM to L UT, cervical spine, Deltoid  LUE PROM with end range holds  within protocol limitations    GH Jt mobs  Rows & Ext red 2x10 AAROM flex 2x10 2lb  Shoulder abd AROM 2x10  07/20/23 UBE L2 x 3 min each Rows & Lats 15lb 2x10 Shoulder flex & Abd AROM x10  1lb x 10 LUE ER/IR with arm to side 2x10 yellow  15lb triceps Ext 10lb 2x10 LUE PROM with end range holds  within protocol limitations   GH Jt mobs   07/07/23 UBE L2 x 3 min each Rows & Ext ( neutral ) 2x10 LUE ER/IR with arm to side 2x10 yellow  Shoulder flex & Abd AROM x10 Rows & Lats 15lb 2x10 LUE PROM with end range holds  within protocol limitations   GH Jt mobs   07/06/23 UBE L2 x 3 min each ROM Total Disability Score: 38 / 80 = 47.5 % Total SPADI Score: 53 / 130 = 40.8 % AAROM Flex & Abd w/ 1lb dowl 2x10 Shoulder flex & Abd AROM x10 LUE ER/IR with arm to side 2x10 yellow  LUE PROM with end range holds  within protocol limitations    PATIENT EDUCATION: Education details: POC, goals Person educated: Patient and Spouse Education method: Explanation, Demonstration, Tactile cues, Verbal cues, and Handouts Education comprehension:  verbalized understanding, returned demonstration, and verbal cues required  HOME EXERCISE PROGRAM: Provided printed directions  ASSESSMENT:  CLINICAL IMPRESSION: Patient is a 68 y.o. female who was treated today by physical therapy due to recovery from L reverse TSA.   She presents with decreased L shoulder ROM, strength and function.  Continued to exercises to tolerance tolerance following Dr Rosea protocol. Less intense exercise selection today due to subjective repots. Postural cues needed with rows. Tactiel cues to elbow for positioning with ER/IR. No increase in pain during session. Some compensation with shoulder abduction. She should  benefit from skilled PT to address her recovery, she is well motivated and cooperative.   OBJECTIVE IMPAIRMENTS: decreased activity tolerance, decreased ROM, decreased strength, impaired UE functional use, postural dysfunction, and pain.   ACTIVITY LIMITATIONS: carrying, lifting, sleeping, transfers, bathing, dressing, self feeding, and reach over head  PARTICIPATION LIMITATIONS: meal prep, cleaning, laundry, driving, community activity, and yard work  PERSONAL FACTORS: Age, Behavior pattern, Fitness, Past/current experiences, Time since onset of injury/illness/exacerbation, and 1-2 comorbidities: h/o falls, emphysema are also affecting patient's functional outcome.   REHAB POTENTIAL: Good  CLINICAL DECISION MAKING: Evolving/moderate complexity  EVALUATION COMPLEXITY: Moderate   GOALS: Goals reviewed with patient? Yes  SHORT TERM GOALS: Target date: 2 weeks 06/15/23  IHEP Baseline: Goal status: Met 07/23/23   LONG TERM GOALS: Target date: 08/24/23, 12 weeks  Total Disability Score: 59 / 80 = 73.8 % Total SPADI Score: 105 / 130 = 80.8 %: Improve to 20 % DISABLITY OR LESS  Baseline:  Goal status: On going 07/06/23, 07/23/23 Total Disability Score: 21 / 80 = 26.3 % Total SPADI Score: 33 / 130 = 25.4 %  2.  Active ROM L shoulder elevation 130 or  greater Baseline: na yet Goal status: Ongoing 07/06/23, Ongoing 07/22/24, Progressing 08/12/23  3.  Strength L shoulder flex, abd, IR 5/5, ER 4-/5 Baseline: nt Goal status: Progressing 07/28/23   PLAN:  PT FREQUENCY: 2x/week  PT DURATION: 12 weeks  PLANNED INTERVENTIONS: 97110-Therapeutic exercises, 97530- Therapeutic activity, 97112- Neuromuscular re-education, 97535- Self Care, 02859- Manual therapy, and 97033- Ionotophoresis 4mg /ml Dexamethasone   PLAN FOR NEXT SESSION: continue with stretching PROM, manual techniques and modalities as needed to address pain management L shoulder   Tanda KANDICE Sorrow, PTA,  08/17/2023, 3:18 PM

## 2023-08-20 ENCOUNTER — Ambulatory Visit: Admitting: Physical Therapy

## 2023-08-24 ENCOUNTER — Ambulatory Visit: Admitting: Physical Therapy

## 2023-08-27 ENCOUNTER — Ambulatory Visit: Admitting: Physical Therapy

## 2023-08-27 ENCOUNTER — Encounter: Payer: Self-pay | Admitting: Physical Therapy

## 2023-08-27 DIAGNOSIS — M25512 Pain in left shoulder: Secondary | ICD-10-CM | POA: Diagnosis not present

## 2023-08-27 DIAGNOSIS — R29898 Other symptoms and signs involving the musculoskeletal system: Secondary | ICD-10-CM | POA: Diagnosis not present

## 2023-08-27 DIAGNOSIS — M25612 Stiffness of left shoulder, not elsewhere classified: Secondary | ICD-10-CM | POA: Diagnosis not present

## 2023-08-27 NOTE — Therapy (Signed)
 OUTPATIENT PHYSICAL THERAPY SHOULDER TREATMENT     Patient Name: Gabriella Farrell MRN: [id number] DOB:1955-04-08, 68 y.o., female Today's Date: 08/27/2023  END OF SESSION:  PT End of Session - 08/27/23 1258     Visit Number 15    Date for PT Re-Evaluation 09/27/23    PT Start Time 1300    PT Stop Time 1345    PT Time Calculation (min) 45 min    Activity Tolerance Patient tolerated treatment well    Behavior During Therapy WFL for tasks assessed/performed           Past Medical History:  Diagnosis Date   Anxiety    Arthritis    Cancer (HCC)    lesion on right side of neck   Chronic kidney disease    COPD (chronic obstructive pulmonary disease) (HCC)    Depression    Discoid lupus erythematosus    GERD (gastroesophageal reflux disease)    H/O chronic pancreatitis    H/O ETOH abuse    Hypertension    Multiple pulmonary nodules    Stable per Pulmonology   SIADH (syndrome of inappropriate ADH production) (HCC)    Sleep apnea    no CPAP   Smoker    Past Surgical History:  Procedure Laterality Date   BREAST EXCISIONAL BIOPSY Right    CHOLECYSTECTOMY  2010   open cholecystectomy and also repaired ducts in pancreas   COLONOSCOPY W/ BIOPSIES AND POLYPECTOMY     x 5   ESOPHAGOGASTRODUODENOSCOPY     x 3   EYE SURGERY Bilateral 2020   cataract extractons   HARDWARE REMOVAL Left 04/30/2023   Procedure: REMOVAL, HARDWARE;  Surgeon: Melita Drivers, MD;  Location: [facility];  Service: Orthopedics;  Laterality: Left;   REVERSE SHOULDER ARTHROPLASTY Left 04/30/2023   Procedure: ARTHROPLASTY, SHOULDER, TOTAL, REVERSE;  Surgeon: Melita Drivers, MD;  Location: [facility];  Service: Orthopedics;  Laterality: Left;   SHOULDER SURGERY Left 2016   for Fx Humerus and dislocation   WISDOM TOOTH EXTRACTION     Patient Active Problem List   Diagnosis Date Noted   S/P reverse total shoulder arthroplasty, left 04/30/2023   Centrilobular emphysema (HCC) 04/06/2023   Lung nodules 04/06/2023    Preoperative respiratory examination 04/06/2023   Tobacco use disorder 09/27/2018   Excessive daytime sleepiness 09/27/2018   Snoring 09/27/2018   Anxiety 12/04/2014   Depression 12/04/2014    PCP: Katina Pfeiffer, PA-C  REFERRING PROVIDER: Melita Drivers, MD  REFERRING DIAG: s/p removal of hardware and reverse TSA  THERAPY DIAG:  Acute pain of left shoulder  Stiffness of left shoulder, not elsewhere classified  Weakness of left shoulder  Rationale for Evaluation and Treatment: Rehabilitation  ONSET DATE: 04/30/23  SUBJECTIVE:  SUBJECTIVE STATEMENT: Tired but ok  Hand dominance: Right  PERTINENT HISTORY: [place] nearly 8 years ago, had fracture L prox humerus, with ORIF, developed traumatic arthritis, underwent reverse TSA  PAIN:  Are you having pain? Yes: NPRS scale: 2-3/10 Pain location: L anterior shoulder Pain description: throbbing  Aggravating factors: lifting arm Relieving factors: ice, heat, meds, sling  PRECAUTIONS: Shoulder  RED FLAGS: None   WEIGHT BEARING RESTRICTIONS: Yes NWB L UE   FALLS:  Has patient fallen in last 6 months? No  LIVING ENVIRONMENT: Lives with: lives with their spouse Lives in: House/apartment Stairs: No Has following equipment at home: sling   OCCUPATION: retired  PLOF: Independent  PATIENT GOALS:able to move and use L arm without pain  NEXT MD VISIT:   OBJECTIVE:  Note: Objective measures were completed at Evaluation unless otherwise noted.  DIAGNOSTIC FINDINGS:  na  PATIENT SURVEYS:  SPADI Total Disability Score: 59 / 80 = 73.8 % Total SPADI Score: 105 / 130 = 80.8 %   COGNITION: Overall cognitive status: Within functional limits for tasks assessed     SENSATION: Reports intermittent numbness and weakness L hand and wrist    POSTURE: Very lean female, non tension tremor noted Ue's and LEs  UPPER EXTREMITY ROM:   Passive ROM Left eval Left 06/24/23 AROM Left 07/06/23 AROM Left AROM 07/23/23 Left AROM 08/12/23 Left AROM 08/27/23  Shoulder flexion 88 86 89 110 119 125  Shoulder extension nt       Shoulder abduction 60 65 63 80 89 91  Shoulder adduction        Shoulder internal rotation nt       Shoulder external rotation 18       Elbow flexion wfl       Elbow extension wfl       Wrist flexion        Wrist extension        Wrist ulnar deviation        Wrist radial deviation        Wrist pronation        Wrist supination        (Blank rows =wfl)  UPPER EXTREMITY MMT:  MMT Left eval Left 07/28/23 Left  08/27/23  Shoulder flexion nt 4- 4  Shoulder extension nt    Shoulder abduction nt 3+ 4-  Shoulder adduction nt    Shoulder internal rotation nt 4- 4+  Shoulder external rotation nt 3+ 4+  Middle trapezius     Lower trapezius     Elbow flexion 3-    Elbow extension 3-    Wrist flexion     Wrist extension     Wrist ulnar deviation     Wrist radial deviation     Wrist pronation     Wrist supination     Grip strength (lbs)     (Blank rows = not tested)  PALPATION:  Tender, edematous medial upper arm Incision ant upper humerus healing, raised no redness or warmth noted  TREATMENT DATE: 08/27/23 UBE L 2.5 x3 min each  GOALS Shoulder flex 2lb 2x10 Shoulder abd 1lb 2x10 LUE ER & IR green 2x10 Rows & Lats 20lb 2x12  STM to L UT, cervical spine, Deltoid  LUE PROM with end range holds  within protocol limitations Shoulder abd no weight 2x10 08/17/23 UBE L 2.5 x3 min each  Bilat shoulder Flex 3lb WaTE 2x10 LUE ER & IR red 2x10 Rows blue 2x10 Horiz abd red 2x10 Triceps Ext 15lb 2x10 Biceps curls 2lb 2x10 LUE PROM with end range holds  within protocol limitations   GH Jt  mobs  Shoulder abd 2x10  08/14/23 UBE L 2 x3 min each  Shoulder Flex 2lb 2x10 Shoulder abd 1lb 2x10 LUE ER & IR yellow 2x10 Rows blue 2x15 Shoulder Ext red 2x10 Rows & Lats 20lb 2x12  STM to bilateral UT and cervical spine  08/12/23 UBE L 2 x3 min each  Shoulder Flex, Ext, IR 2lb WaTE x10 AAROM Shoulder Flex 2lb 2x10 Shoulder abd 1lb 2x10 Rows & Lats 20lb 2x12  Triceps Ext 15lb 2x10 Biceps curls 2lb 2x10  2 level cabinet reaches   07/30/23 UBE L2 x 3 min each Wall flex, Abd, CW/CCW w/ pillow case  LUE ER red 2x10 LUE IR red 2x10 Shoulder Flex 2lb 2x10 Shoulder abd 1lb 2x10 Rows & Lats 20lb 2x10 Shoulder Ext 5lb 2x5 Triceps Ext 10lb 2x10 2 level cabinet reaches  Biceps curls 2lb 2x10   07/28/23 UBE L2 x 3 min each Goals   MMT  Rows & Lats 20lb 2x10 Shoulder Ext blue 2x10 Horiz abs red 2x10 Triceps Ext 15lb 2x 10 STM to L UT, cervical spine, Deltoid  LUE PROM with end range holds  within protocol limitations  STM to L UT and cervical spine Supine LUE ER/IR 1lb x10 each  PATIENT EDUCATION: Education details: POC, goals Person educated: Patient and Spouse Education method: Explanation, Demonstration, Tactile cues, Verbal cues, and Handouts Education comprehension: verbalized understanding, returned demonstration, and verbal cues required  HOME EXERCISE PROGRAM: Provided printed directions  ASSESSMENT:  CLINICAL IMPRESSION: Patient is a 68 y.o. female who was treated today by physical therapy due to recovery from L reverse TSA.   She is progressing overall with ROM, function, and strength  but deficits still remains. Continued to exercises to tolerance tolerance following Dr Rosea protocol. Postural cues needed with rows. Tactiel cues to elbow for positioning with ER/IR. No increase in pain during session. Some compensation with shoulder abduction. She should benefit from skilled PT to address her recovery, she is well motivated and cooperative.   OBJECTIVE  IMPAIRMENTS: decreased activity tolerance, decreased ROM, decreased strength, impaired UE functional use, postural dysfunction, and pain.   ACTIVITY LIMITATIONS: carrying, lifting, sleeping, transfers, bathing, dressing, self feeding, and reach over head  PARTICIPATION LIMITATIONS: meal prep, cleaning, laundry, driving, community activity, and yard work  PERSONAL FACTORS: Age, Behavior pattern, Fitness, Past/current experiences, Time since onset of injury/illness/exacerbation, and 1-2 comorbidities: h/o falls, emphysema are also affecting patient's functional outcome.   REHAB POTENTIAL: Good  CLINICAL DECISION MAKING: Evolving/moderate complexity  EVALUATION COMPLEXITY: Moderate   GOALS: Goals reviewed with patient? Yes  SHORT TERM GOALS: Target date: 2 weeks 06/15/23  IHEP Baseline: Goal status: Met 07/23/23   LONG TERM GOALS: Target date: 08/24/23, 12 weeks  Total Disability Score: 59 / 80 = 73.8 % Total SPADI Score: 105 / 130 = 80.8 %: Improve to 20 % DISABLITY OR LESS  Baseline:  Goal status:  On going 07/06/23, 07/23/23 Total Disability Score: 21 / 80 = 26.3 % Total SPADI Score: 33 / 130 = 25.4 %, 08/27/23 Total Disability Score: 17 / 80 = 21.3 % SPADI Score: 30 / 130 = 23.1 %  2.  Active ROM L shoulder elevation 130 or greater Baseline: na yet Goal status: Ongoing 07/06/23, Ongoing 07/22/24, Progressing 08/12/23, Progressing 08/27/23  3.  Strength L shoulder flex, abd, IR 5/5, ER 4-/5 Baseline: nt Goal status: Progressing 07/28/23, Progressing 08/27/23   PLAN:  PT FREQUENCY: 2x/week  PT DURATION: 12 weeks  PLANNED INTERVENTIONS: 97110-Therapeutic exercises, 97530- Therapeutic activity, 97112- Neuromuscular re-education, 97535- Self Care, 02859- Manual therapy, and 97033- Ionotophoresis 4mg /ml Dexamethasone   PLAN FOR NEXT SESSION: continue with stretching PROM, manual techniques and modalities as needed to address pain management L shoulder   ALBRIGHT,MICHAEL W, PT,   08/27/2023, 3:29 PM

## 2023-08-31 ENCOUNTER — Ambulatory Visit: Admitting: Physical Therapy

## 2023-08-31 ENCOUNTER — Encounter: Payer: Self-pay | Admitting: Physical Therapy

## 2023-08-31 DIAGNOSIS — M25512 Pain in left shoulder: Secondary | ICD-10-CM

## 2023-08-31 DIAGNOSIS — R29898 Other symptoms and signs involving the musculoskeletal system: Secondary | ICD-10-CM

## 2023-08-31 DIAGNOSIS — M542 Cervicalgia: Secondary | ICD-10-CM | POA: Diagnosis not present

## 2023-08-31 DIAGNOSIS — M25612 Stiffness of left shoulder, not elsewhere classified: Secondary | ICD-10-CM

## 2023-08-31 NOTE — Therapy (Signed)
 OUTPATIENT PHYSICAL THERAPY SHOULDER TREATMENT     Patient Name: Gabriella Farrell MRN: [id number] DOB:1955/07/05, 68 y.o., female Today's Date: 08/31/2023  END OF SESSION:  PT End of Session - 08/31/23 1253     Visit Number 16    Date for PT Re-Evaluation 09/27/23    PT Start Time 1255    PT Stop Time 1340    PT Time Calculation (min) 45 min    Activity Tolerance Patient tolerated treatment well    Behavior During Therapy WFL for tasks assessed/performed           Past Medical History:  Diagnosis Date   Anxiety    Arthritis    Cancer (HCC)    lesion on right side of neck   Chronic kidney disease    COPD (chronic obstructive pulmonary disease) (HCC)    Depression    Discoid lupus erythematosus    GERD (gastroesophageal reflux disease)    H/O chronic pancreatitis    H/O ETOH abuse    Hypertension    Multiple pulmonary nodules    Stable per Pulmonology   SIADH (syndrome of inappropriate ADH production) (HCC)    Sleep apnea    no CPAP   Smoker    Past Surgical History:  Procedure Laterality Date   BREAST EXCISIONAL BIOPSY Right    CHOLECYSTECTOMY  2010   open cholecystectomy and also repaired ducts in pancreas   COLONOSCOPY W/ BIOPSIES AND POLYPECTOMY     x 5   ESOPHAGOGASTRODUODENOSCOPY     x 3   EYE SURGERY Bilateral 2020   cataract extractons   HARDWARE REMOVAL Left 04/30/2023   Procedure: REMOVAL, HARDWARE;  Surgeon: Melita Drivers, MD;  Location: [facility];  Service: Orthopedics;  Laterality: Left;   REVERSE SHOULDER ARTHROPLASTY Left 04/30/2023   Procedure: ARTHROPLASTY, SHOULDER, TOTAL, REVERSE;  Surgeon: Melita Drivers, MD;  Location: [facility];  Service: Orthopedics;  Laterality: Left;   SHOULDER SURGERY Left 2016   for Fx Humerus and dislocation   WISDOM TOOTH EXTRACTION     Patient Active Problem List   Diagnosis Date Noted   S/P reverse total shoulder arthroplasty, left 04/30/2023   Centrilobular emphysema (HCC) 04/06/2023   Lung nodules 04/06/2023    Preoperative respiratory examination 04/06/2023   Tobacco use disorder 09/27/2018   Excessive daytime sleepiness 09/27/2018   Snoring 09/27/2018   Anxiety 12/04/2014   Depression 12/04/2014    PCP: Katina Pfeiffer, PA-C  REFERRING PROVIDER: Melita Drivers, MD  REFERRING DIAG: s/p removal of hardware and reverse TSA  THERAPY DIAG:  Acute pain of left shoulder  Stiffness of left shoulder, not elsewhere classified  Weakness of left shoulder  Rationale for Evaluation and Treatment: Rehabilitation  ONSET DATE: 04/30/23  SUBJECTIVE:  SUBJECTIVE STATEMENT: Shoulder is feeling all right. Will be getting a MRI of her neck Aug 17  Hand dominance: Right  PERTINENT HISTORY: [place] nearly 8 years ago, had fracture L prox humerus, with ORIF, developed traumatic arthritis, underwent reverse TSA  PAIN:  Are you having pain? Yes: NPRS scale: 3/10 Pain location: L anterior shoulder Pain description: throbbing  Aggravating factors: lifting arm Relieving factors: ice, heat, meds, sling  PRECAUTIONS: Shoulder  RED FLAGS: None   WEIGHT BEARING RESTRICTIONS: Yes NWB L UE   FALLS:  Has patient fallen in last 6 months? No  LIVING ENVIRONMENT: Lives with: lives with their spouse Lives in: House/apartment Stairs: No Has following equipment at home: sling   OCCUPATION: retired  PLOF: Independent  PATIENT GOALS:able to move and use L arm without pain  NEXT MD VISIT:   OBJECTIVE:  Note: Objective measures were completed at Evaluation unless otherwise noted.  DIAGNOSTIC FINDINGS:  na  PATIENT SURVEYS:  SPADI Total Disability Score: 59 / 80 = 73.8 % Total SPADI Score: 105 / 130 = 80.8 %   COGNITION: Overall cognitive status: Within functional limits for tasks assessed     SENSATION: Reports  intermittent numbness and weakness L hand and wrist   POSTURE: Very lean female, non tension tremor noted Ue's and LEs  UPPER EXTREMITY ROM:   Passive ROM Left eval Left 06/24/23 AROM Left 07/06/23 AROM Left AROM 07/23/23 Left AROM 08/12/23 Left AROM 08/27/23  Shoulder flexion 88 86 89 110 119 125  Shoulder extension nt       Shoulder abduction 60 65 63 80 89 91  Shoulder adduction        Shoulder internal rotation nt       Shoulder external rotation 18       Elbow flexion wfl       Elbow extension wfl       Wrist flexion        Wrist extension        Wrist ulnar deviation        Wrist radial deviation        Wrist pronation        Wrist supination        (Blank rows =wfl)  UPPER EXTREMITY MMT:  MMT Left eval Left 07/28/23 Left  08/27/23  Shoulder flexion nt 4- 4  Shoulder extension nt    Shoulder abduction nt 3+ 4-  Shoulder adduction nt    Shoulder internal rotation nt 4- 4+  Shoulder external rotation nt 3+ 4+  Middle trapezius     Lower trapezius     Elbow flexion 3-    Elbow extension 3-    Wrist flexion     Wrist extension     Wrist ulnar deviation     Wrist radial deviation     Wrist pronation     Wrist supination     Grip strength (lbs)     (Blank rows = not tested)  PALPATION:  Tender, edematous medial upper arm Incision ant upper humerus healing, raised no redness or warmth noted  TREATMENT DATE: 08/31/23 UBE L 2 x 3 in each 2 level cabinet reaches Flex 2lb, abd 1lb LUE x10 each LUE ER & IR green 2x10 Shoulder flex 2lb 2x10 Shoulder abd 1lb 2x10 Pillow case on wall LEU flex, CW, CCW, Abd Rows & Lats 20lb 2x12  Shoulder Ext 5lb 2x10 STM to cervical spine and UT  08/27/23 UBE L 2.5 x3 min each  GOALS Shoulder flex 2lb 2x10 Shoulder abd 1lb 2x10 LUE ER & IR green 2x10 Rows & Lats 20lb 2x12  STM to L UT, cervical spine,  Deltoid  LUE PROM with end range holds  within protocol limitations Shoulder abd no weight 2x10 08/17/23 UBE L 2.5 x3 min each  Bilat shoulder Flex 3lb WaTE 2x10 LUE ER & IR red 2x10 Rows blue 2x10 Horiz abd red 2x10 Triceps Ext 15lb 2x10 Biceps curls 2lb 2x10 LUE PROM with end range holds  within protocol limitations   GH Jt mobs  Shoulder abd 2x10  08/14/23 UBE L 2 x3 min each  Shoulder Flex 2lb 2x10 Shoulder abd 1lb 2x10 LUE ER & IR yellow 2x10 Rows blue 2x15 Shoulder Ext red 2x10 Rows & Lats 20lb 2x12  STM to bilateral UT and cervical spine  08/12/23 UBE L 2 x3 min each  Shoulder Flex, Ext, IR 2lb WaTE x10 AAROM Shoulder Flex 2lb 2x10 Shoulder abd 1lb 2x10 Rows & Lats 20lb 2x12  Triceps Ext 15lb 2x10 Biceps curls 2lb 2x10  2 level cabinet reaches   07/30/23 UBE L2 x 3 min each Wall flex, Abd, CW/CCW w/ pillow case  LUE ER red 2x10 LUE IR red 2x10 Shoulder Flex 2lb 2x10 Shoulder abd 1lb 2x10 Rows & Lats 20lb 2x10 Shoulder Ext 5lb 2x5 Triceps Ext 10lb 2x10 2 level cabinet reaches  Biceps curls 2lb 2x10   07/28/23 UBE L2 x 3 min each Goals   MMT  Rows & Lats 20lb 2x10 Shoulder Ext blue 2x10 Horiz abs red 2x10 Triceps Ext 15lb 2x 10 STM to L UT, cervical spine, Deltoid  LUE PROM with end range holds  within protocol limitations  STM to L UT and cervical spine Supine LUE ER/IR 1lb x10 each  PATIENT EDUCATION: Education details: POC, goals Person educated: Patient and Spouse Education method: Explanation, Demonstration, Tactile cues, Verbal cues, and Handouts Education comprehension: verbalized understanding, returned demonstration, and verbal cues required  HOME EXERCISE PROGRAM: Provided printed directions  ASSESSMENT:  CLINICAL IMPRESSION: Patient is a 68 y.o. female who was treated today by physical therapy due to recovery from L reverse TSA.   She continues to progress overall with ROM, function, and strength  but deficits still remains. She does  report some radiating symptoms down her arm and has a MRI scheduled for her neck. Continued to exercises to tolerance tolerance following Dr Rosea protocol. Postural cues needed with shoulder Ext.  Tactiel cues to elbow for positioning with ER/IR.  Some compensation with shoulder abduction. She should benefit from skilled PT to address her recovery, she is well motivated and cooperative.   OBJECTIVE IMPAIRMENTS: decreased activity tolerance, decreased ROM, decreased strength, impaired UE functional use, postural dysfunction, and pain.   ACTIVITY LIMITATIONS: carrying, lifting, sleeping, transfers, bathing, dressing, self feeding, and reach over head  PARTICIPATION LIMITATIONS: meal prep, cleaning, laundry, driving, community activity, and yard work  PERSONAL FACTORS: Age, Behavior pattern, Fitness, Past/current experiences, Time since onset of injury/illness/exacerbation, and 1-2 comorbidities: h/o falls, emphysema are also affecting patient's functional outcome.   REHAB POTENTIAL: Good  CLINICAL DECISION MAKING: Evolving/moderate complexity  EVALUATION COMPLEXITY: Moderate   GOALS: Goals reviewed with patient? Yes  SHORT TERM GOALS: Target date: 2 weeks 06/15/23  IHEP Baseline: Goal status: Met 07/23/23   LONG TERM GOALS: Target date: 08/24/23, 12 weeks  Total Disability Score: 59 / 80 = 73.8 % Total SPADI Score: 105 / 130 = 80.8 %: Improve to 20 % DISABLITY OR LESS  Baseline:  Goal status: On going 07/06/23, 07/23/23 Total Disability Score: 21 / 80 = 26.3 % Total SPADI Score: 33 / 130 = 25.4 %, 08/27/23 Total Disability Score: 17 / 80 = 21.3 % SPADI Score: 30 / 130 = 23.1 %  2.  Active ROM L shoulder elevation 130 or greater Baseline: na yet Goal status: Ongoing 07/06/23, Ongoing 07/22/24, Progressing 08/12/23, Progressing 08/27/23  3.  Strength L shoulder flex, abd, IR 5/5, ER 4-/5 Baseline: nt Goal status: Progressing 07/28/23, Progressing 08/27/23   PLAN:  PT FREQUENCY:  2x/week  PT DURATION: 12 weeks  PLANNED INTERVENTIONS: 97110-Therapeutic exercises, 97530- Therapeutic activity, 97112- Neuromuscular re-education, 97535- Self Care, 02859- Manual therapy, and 97033- Ionotophoresis 4mg /ml Dexamethasone   PLAN FOR NEXT SESSION: continue with stretching PROM, manual techniques and modalities as needed to address pain management L shoulder   Tanda KANDICE Sorrow, PTA,  08/31/2023, 12:54 PM

## 2023-09-01 DIAGNOSIS — M818 Other osteoporosis without current pathological fracture: Secondary | ICD-10-CM | POA: Diagnosis not present

## 2023-09-03 ENCOUNTER — Ambulatory Visit: Admitting: Physical Therapy

## 2023-09-07 ENCOUNTER — Encounter: Payer: Self-pay | Admitting: Physical Therapy

## 2023-09-07 ENCOUNTER — Ambulatory Visit: Attending: Orthopedic Surgery | Admitting: Physical Therapy

## 2023-09-07 DIAGNOSIS — M25512 Pain in left shoulder: Secondary | ICD-10-CM | POA: Diagnosis not present

## 2023-09-07 DIAGNOSIS — M25612 Stiffness of left shoulder, not elsewhere classified: Secondary | ICD-10-CM | POA: Insufficient documentation

## 2023-09-07 DIAGNOSIS — R29898 Other symptoms and signs involving the musculoskeletal system: Secondary | ICD-10-CM | POA: Insufficient documentation

## 2023-09-07 NOTE — Therapy (Signed)
 OUTPATIENT PHYSICAL THERAPY SHOULDER TREATMENT     Patient Name: Gabriella Farrell MRN: [id number] DOB:08-28-1955, 68 y.o., female Today's Date: 09/07/2023  END OF SESSION:  PT End of Session - 09/07/23 1300     Visit Number 17    Date for PT Re-Evaluation 09/27/23    PT Start Time 1300    PT Stop Time 1345    PT Time Calculation (min) 45 min    Activity Tolerance Patient tolerated treatment well    Behavior During Therapy WFL for tasks assessed/performed           Past Medical History:  Diagnosis Date   Anxiety    Arthritis    Cancer (HCC)    lesion on right side of neck   Chronic kidney disease    COPD (chronic obstructive pulmonary disease) (HCC)    Depression    Discoid lupus erythematosus    GERD (gastroesophageal reflux disease)    H/O chronic pancreatitis    H/O ETOH abuse    Hypertension    Multiple pulmonary nodules    Stable per Pulmonology   SIADH (syndrome of inappropriate ADH production) (HCC)    Sleep apnea    no CPAP   Smoker    Past Surgical History:  Procedure Laterality Date   BREAST EXCISIONAL BIOPSY Right    CHOLECYSTECTOMY  2010   open cholecystectomy and also repaired ducts in pancreas   COLONOSCOPY W/ BIOPSIES AND POLYPECTOMY     x 5   ESOPHAGOGASTRODUODENOSCOPY     x 3   EYE SURGERY Bilateral 2020   cataract extractons   HARDWARE REMOVAL Left 04/30/2023   Procedure: REMOVAL, HARDWARE;  Surgeon: Melita Drivers, MD;  Location: [facility];  Service: Orthopedics;  Laterality: Left;   REVERSE SHOULDER ARTHROPLASTY Left 04/30/2023   Procedure: ARTHROPLASTY, SHOULDER, TOTAL, REVERSE;  Surgeon: Melita Drivers, MD;  Location: [facility];  Service: Orthopedics;  Laterality: Left;   SHOULDER SURGERY Left 2016   for Fx Humerus and dislocation   WISDOM TOOTH EXTRACTION     Patient Active Problem List   Diagnosis Date Noted   S/P reverse total shoulder arthroplasty, left 04/30/2023   Centrilobular emphysema (HCC) 04/06/2023   Lung nodules 04/06/2023    Preoperative respiratory examination 04/06/2023   Tobacco use disorder 09/27/2018   Excessive daytime sleepiness 09/27/2018   Snoring 09/27/2018   Anxiety 12/04/2014   Depression 12/04/2014    PCP: Katina Pfeiffer, PA-C  REFERRING PROVIDER: Melita Drivers, MD  REFERRING DIAG: s/p removal of hardware and reverse TSA  THERAPY DIAG:  Acute pain of left shoulder  Weakness of left shoulder  Stiffness of left shoulder, not elsewhere classified  Rationale for Evaluation and Treatment: Rehabilitation  ONSET DATE: 04/30/23  SUBJECTIVE:  SUBJECTIVE STATEMENT: Better had a rough week last week IV drip for osteoporosis . MRI of her neck Aug 17  Hand dominance: Right  PERTINENT HISTORY: [place] nearly 8 years ago, had fracture L prox humerus, with ORIF, developed traumatic arthritis, underwent reverse TSA  PAIN:  Are you having pain? Yes: NPRS scale: 2/10 Pain location: L anterior shoulder Pain description: throbbing  Aggravating factors: lifting arm Relieving factors: ice, heat, meds, sling  PRECAUTIONS: Shoulder  RED FLAGS: None   WEIGHT BEARING RESTRICTIONS: Yes NWB L UE   FALLS:  Has patient fallen in last 6 months? No  LIVING ENVIRONMENT: Lives with: lives with their spouse Lives in: House/apartment Stairs: No Has following equipment at home: sling   OCCUPATION: retired  PLOF: Independent  PATIENT GOALS:able to move and use L arm without pain  NEXT MD VISIT:   OBJECTIVE:  Note: Objective measures were completed at Evaluation unless otherwise noted.  DIAGNOSTIC FINDINGS:  na  PATIENT SURVEYS:  SPADI Total Disability Score: 59 / 80 = 73.8 % Total SPADI Score: 105 / 130 = 80.8 %   COGNITION: Overall cognitive status: Within functional limits for tasks  assessed     SENSATION: Reports intermittent numbness and weakness L hand and wrist   POSTURE: Very lean female, non tension tremor noted Ue's and LEs  UPPER EXTREMITY ROM:   Passive ROM Left eval Left 06/24/23 AROM Left 07/06/23 AROM Left AROM 07/23/23 Left AROM 08/12/23 Left AROM 08/27/23  Shoulder flexion 88 86 89 110 119 125  Shoulder extension nt       Shoulder abduction 60 65 63 80 89 91  Shoulder adduction        Shoulder internal rotation nt       Shoulder external rotation 18       Elbow flexion wfl       Elbow extension wfl       Wrist flexion        Wrist extension        Wrist ulnar deviation        Wrist radial deviation        Wrist pronation        Wrist supination        (Blank rows =wfl)  UPPER EXTREMITY MMT:  MMT Left eval Left 07/28/23 Left  08/27/23  Shoulder flexion nt 4- 4  Shoulder extension nt    Shoulder abduction nt 3+ 4-  Shoulder adduction nt    Shoulder internal rotation nt 4- 4+  Shoulder external rotation nt 3+ 4+  Middle trapezius     Lower trapezius     Elbow flexion 3-    Elbow extension 3-    Wrist flexion     Wrist extension     Wrist ulnar deviation     Wrist radial deviation     Wrist pronation     Wrist supination     Grip strength (lbs)     (Blank rows = not tested)  PALPATION:  Tender, edematous medial upper arm Incision ant upper humerus healing, raised no redness or warmth noted  TREATMENT DATE: 09/07/23 UBE L2.5 x 3 min each Pillow case on wall LUE flex, CW, CCW, Abd Seated Rows 25lb 2x10 Lat pull downs 25lb x 8, 20lb x8 LUE ER green 2x10, IR 2x10 Shoulder abd 1lb 2x10 Shoulder Flex 1lb 2x10 STM to UT & cervical spine  Cervical AROM  08/31/23 UBE L 2 x 3 in each 2 level cabinet reaches Flex 2lb, abd 1lb LUE x10 each LUE ER & IR green 2x10 Shoulder flex 2lb 2x10 Shoulder abd 1lb 2x10 Pillow  case on wall LEU flex, CW, CCW, Abd Rows & Lats 20lb 2x12  Shoulder Ext 5lb 2x10 STM to cervical spine and UT  08/27/23 UBE L 2.5 x3 min each  GOALS Shoulder flex 2lb 2x10 Shoulder abd 1lb 2x10 LUE ER & IR green 2x10 Rows & Lats 20lb 2x12  STM to L UT, cervical spine, Deltoid  LUE PROM with end range holds  within protocol limitations Shoulder abd no weight 2x10 08/17/23 UBE L 2.5 x3 min each  Bilat shoulder Flex 3lb WaTE 2x10 LUE ER & IR red 2x10 Rows blue 2x10 Horiz abd red 2x10 Triceps Ext 15lb 2x10 Biceps curls 2lb 2x10 LUE PROM with end range holds  within protocol limitations   GH Jt mobs  Shoulder abd 2x10  08/14/23 UBE L 2 x3 min each  Shoulder Flex 2lb 2x10 Shoulder abd 1lb 2x10 LUE ER & IR yellow 2x10 Rows blue 2x15 Shoulder Ext red 2x10 Rows & Lats 20lb 2x12  STM to bilateral UT and cervical spine  08/12/23 UBE L 2 x3 min each  Shoulder Flex, Ext, IR 2lb WaTE x10 AAROM Shoulder Flex 2lb 2x10 Shoulder abd 1lb 2x10 Rows & Lats 20lb 2x12  Triceps Ext 15lb 2x10 Biceps curls 2lb 2x10  2 level cabinet reaches   07/30/23 UBE L2 x 3 min each Wall flex, Abd, CW/CCW w/ pillow case  LUE ER red 2x10 LUE IR red 2x10 Shoulder Flex 2lb 2x10 Shoulder abd 1lb 2x10 Rows & Lats 20lb 2x10 Shoulder Ext 5lb 2x5 Triceps Ext 10lb 2x10 2 level cabinet reaches  Biceps curls 2lb 2x10   07/28/23 UBE L2 x 3 min each Goals   MMT  Rows & Lats 20lb 2x10 Shoulder Ext blue 2x10 Horiz abs red 2x10 Triceps Ext 15lb 2x 10 STM to L UT, cervical spine, Deltoid  LUE PROM with end range holds  within protocol limitations  STM to L UT and cervical spine Supine LUE ER/IR 1lb x10 each  PATIENT EDUCATION: Education details: POC, goals Person educated: Patient and Spouse Education method: Explanation, Demonstration, Tactile cues, Verbal cues, and Handouts Education comprehension: verbalized understanding, returned demonstration, and verbal cues required  HOME EXERCISE  PROGRAM: Provided printed directions  ASSESSMENT:  CLINICAL IMPRESSION: Patient is a 68 y.o. female who was treated today by physical therapy due to recovery from L reverse TSA.   She continues to progress overall with ROM, function, and strength  but deficits still remains. Increase pain today that she reports is the side effects form her osteopetrosis IV drip. Continued to exercises to tolerance tolerance following Dr Rosea protocol.  Tactiel cues to elbow for positioning with ER/IR.  Some shoulder elevation with shoulder abduction and flexion. She should benefit from skilled PT to address her recovery, she is well motivated and cooperative.   OBJECTIVE IMPAIRMENTS: decreased activity tolerance, decreased ROM, decreased strength, impaired UE functional use, postural dysfunction, and pain.   ACTIVITY LIMITATIONS: carrying, lifting, sleeping, transfers, bathing, dressing, self feeding, and reach  over head  PARTICIPATION LIMITATIONS: meal prep, cleaning, laundry, driving, community activity, and yard work  PERSONAL FACTORS: Age, Behavior pattern, Fitness, Past/current experiences, Time since onset of injury/illness/exacerbation, and 1-2 comorbidities: h/o falls, emphysema are also affecting patient's functional outcome.   REHAB POTENTIAL: Good  CLINICAL DECISION MAKING: Evolving/moderate complexity  EVALUATION COMPLEXITY: Moderate   GOALS: Goals reviewed with patient? Yes  SHORT TERM GOALS: Target date: 2 weeks 06/15/23  IHEP Baseline: Goal status: Met 07/23/23   LONG TERM GOALS: Target date: 08/24/23, 12 weeks  Total Disability Score: 59 / 80 = 73.8 % Total SPADI Score: 105 / 130 = 80.8 %: Improve to 20 % DISABLITY OR LESS  Baseline:  Goal status: On going 07/06/23, 07/23/23 Total Disability Score: 21 / 80 = 26.3 % Total SPADI Score: 33 / 130 = 25.4 %, 08/27/23 Total Disability Score: 17 / 80 = 21.3 % SPADI Score: 30 / 130 = 23.1 %  2.  Active ROM L shoulder elevation 130 or  greater Baseline: na yet Goal status: Ongoing 07/06/23, Ongoing 07/22/24, Progressing 08/12/23, Progressing 08/27/23  3.  Strength L shoulder flex, abd, IR 5/5, ER 4-/5 Baseline: nt Goal status: Progressing 07/28/23, Progressing 08/27/23   PLAN:  PT FREQUENCY: 2x/week  PT DURATION: 12 weeks  PLANNED INTERVENTIONS: 97110-Therapeutic exercises, 97530- Therapeutic activity, 97112- Neuromuscular re-education, 97535- Self Care, 02859- Manual therapy, and 97033- Ionotophoresis 4mg /ml Dexamethasone   PLAN FOR NEXT SESSION: continue with stretching PROM, manual techniques and modalities as needed to address pain management L shoulder   Tanda KANDICE Sorrow, PTA,  09/07/2023, 1:01 PM

## 2023-09-09 DIAGNOSIS — J029 Acute pharyngitis, unspecified: Secondary | ICD-10-CM | POA: Diagnosis not present

## 2023-09-10 ENCOUNTER — Ambulatory Visit: Admitting: Physical Therapy

## 2023-09-10 ENCOUNTER — Encounter: Payer: Self-pay | Admitting: Physical Therapy

## 2023-09-10 DIAGNOSIS — M25612 Stiffness of left shoulder, not elsewhere classified: Secondary | ICD-10-CM | POA: Diagnosis not present

## 2023-09-10 DIAGNOSIS — M25512 Pain in left shoulder: Secondary | ICD-10-CM

## 2023-09-10 DIAGNOSIS — R29898 Other symptoms and signs involving the musculoskeletal system: Secondary | ICD-10-CM

## 2023-09-10 NOTE — Therapy (Signed)
 OUTPATIENT PHYSICAL THERAPY SHOULDER TREATMENT     Patient Name: Gabriella Farrell MRN: [id number] DOB:14-Jun-1955, 68 y.o., female Today's Date: 09/10/2023  END OF SESSION:  PT End of Session - 09/10/23 1429     Visit Number 18    Date for PT Re-Evaluation 09/27/23    PT Start Time 1430    PT Stop Time 1515    PT Time Calculation (min) 45 min    Activity Tolerance Patient tolerated treatment well    Behavior During Therapy WFL for tasks assessed/performed           Past Medical History:  Diagnosis Date   Anxiety    Arthritis    Cancer (HCC)    lesion on right side of neck   Chronic kidney disease    COPD (chronic obstructive pulmonary disease) (HCC)    Depression    Discoid lupus erythematosus    GERD (gastroesophageal reflux disease)    H/O chronic pancreatitis    H/O ETOH abuse    Hypertension    Multiple pulmonary nodules    Stable per Pulmonology   SIADH (syndrome of inappropriate ADH production) (HCC)    Sleep apnea    no CPAP   Smoker    Past Surgical History:  Procedure Laterality Date   BREAST EXCISIONAL BIOPSY Right    CHOLECYSTECTOMY  2010   open cholecystectomy and also repaired ducts in pancreas   COLONOSCOPY W/ BIOPSIES AND POLYPECTOMY     x 5   ESOPHAGOGASTRODUODENOSCOPY     x 3   EYE SURGERY Bilateral 2020   cataract extractons   HARDWARE REMOVAL Left 04/30/2023   Procedure: REMOVAL, HARDWARE;  Surgeon: Melita Drivers, MD;  Location: [facility];  Service: Orthopedics;  Laterality: Left;   REVERSE SHOULDER ARTHROPLASTY Left 04/30/2023   Procedure: ARTHROPLASTY, SHOULDER, TOTAL, REVERSE;  Surgeon: Melita Drivers, MD;  Location: [facility];  Service: Orthopedics;  Laterality: Left;   SHOULDER SURGERY Left 2016   for Fx Humerus and dislocation   WISDOM TOOTH EXTRACTION     Patient Active Problem List   Diagnosis Date Noted   S/P reverse total shoulder arthroplasty, left 04/30/2023   Centrilobular emphysema (HCC) 04/06/2023   Lung nodules 04/06/2023    Preoperative respiratory examination 04/06/2023   Tobacco use disorder 09/27/2018   Excessive daytime sleepiness 09/27/2018   Snoring 09/27/2018   Anxiety 12/04/2014   Depression 12/04/2014    PCP: Katina Pfeiffer, PA-C  REFERRING PROVIDER: Melita Drivers, MD  REFERRING DIAG: s/p removal of hardware and reverse TSA  THERAPY DIAG:  Acute pain of left shoulder  Weakness of left shoulder  Stiffness of left shoulder, not elsewhere classified  Rationale for Evaluation and Treatment: Rehabilitation  ONSET DATE: 04/30/23  SUBJECTIVE:  SUBJECTIVE STATEMENT: Doing ok  Hand dominance: Right  PERTINENT HISTORY: [place] nearly 8 years ago, had fracture L prox humerus, with ORIF, developed traumatic arthritis, underwent reverse TSA  PAIN:  Are you having pain? Yes: NPRS scale: 2/10 Pain location: L anterior shoulder Pain description: throbbing  Aggravating factors: lifting arm Relieving factors: ice, heat, meds, sling  PRECAUTIONS: Shoulder  RED FLAGS: None   WEIGHT BEARING RESTRICTIONS: Yes NWB L UE   FALLS:  Has patient fallen in last 6 months? No  LIVING ENVIRONMENT: Lives with: lives with their spouse Lives in: House/apartment Stairs: No Has following equipment at home: sling   OCCUPATION: retired  PLOF: Independent  PATIENT GOALS:able to move and use L arm without pain  NEXT MD VISIT:   OBJECTIVE:  Note: Objective measures were completed at Evaluation unless otherwise noted.  DIAGNOSTIC FINDINGS:  na  PATIENT SURVEYS:  SPADI Total Disability Score: 59 / 80 = 73.8 % Total SPADI Score: 105 / 130 = 80.8 %   COGNITION: Overall cognitive status: Within functional limits for tasks assessed     SENSATION: Reports intermittent numbness and weakness L hand and wrist    POSTURE: Very lean female, non tension tremor noted Ue's and LEs  UPPER EXTREMITY ROM:   Passive ROM Left eval Left 06/24/23 AROM Left 07/06/23 AROM Left AROM 07/23/23 Left AROM 08/12/23 Left AROM 08/27/23 Left AROM 09/10/23  Shoulder flexion 88 86 89 110 119 125 133  Shoulder extension nt        Shoulder abduction 60 65 63 80 89 91 96  Shoulder adduction         Shoulder internal rotation nt        Shoulder external rotation 18        Elbow flexion wfl        Elbow extension wfl        Wrist flexion         Wrist extension         Wrist ulnar deviation         Wrist radial deviation         Wrist pronation         Wrist supination         (Blank rows =wfl)  UPPER EXTREMITY MMT:  MMT Left eval Left 07/28/23 Left  08/27/23 Left 09/10/23  Shoulder flexion nt 4- 4 4+  Shoulder extension nt     Shoulder abduction nt 3+ 4- 4  Shoulder adduction nt     Shoulder internal rotation nt 4- 4+ 5  Shoulder external rotation nt 3+ 4+ 5  Middle trapezius      Lower trapezius      Elbow flexion 3-     Elbow extension 3-     Wrist flexion      Wrist extension      Wrist ulnar deviation      Wrist radial deviation      Wrist pronation      Wrist supination      Grip strength (lbs)      (Blank rows = not tested)  PALPATION:  Tender, edematous medial upper arm Incision ant upper humerus healing, raised no redness or warmth noted  TREATMENT DATE: 09/10/23 UBE L2.5 x 3 min each Goals Shoulder abd 1lb 2x10 Shoulder Flex 2lb 2x10  LUE ER/IR green 2x10 Standing rows blue 2x12 Horiz abd red 2x10 STM to UT & cervical spine  Cervical AROM  09/07/23 UBE L2.5 x 3 min each Pillow case on wall LUE flex, CW, CCW, Abd Seated Rows 25lb 2x10 Lat pull downs 25lb x 8, 20lb x8 LUE ER green 2x10, IR 2x10 Shoulder abd 1lb 2x10 Shoulder Flex 1lb 2x10 STM to UT & cervical  spine  Cervical AROM  08/31/23 UBE L 2 x 3 in each 2 level cabinet reaches Flex 2lb, abd 1lb LUE x10 each LUE ER & IR green 2x10 Shoulder flex 2lb 2x10 Shoulder abd 1lb 2x10 Pillow case on wall LEU flex, CW, CCW, Abd Rows & Lats 20lb 2x12  Shoulder Ext 5lb 2x10 STM to cervical spine and UT  08/27/23 UBE L 2.5 x3 min each  GOALS Shoulder flex 2lb 2x10 Shoulder abd 1lb 2x10 LUE ER & IR green 2x10 Rows & Lats 20lb 2x12  STM to L UT, cervical spine, Deltoid  LUE PROM with end range holds  within protocol limitations Shoulder abd no weight 2x10  08/17/23 UBE L 2.5 x3 min each  Bilat shoulder Flex 3lb WaTE 2x10 LUE ER & IR red 2x10 Rows blue 2x10 Horiz abd red 2x10 Triceps Ext 15lb 2x10 Biceps curls 2lb 2x10 LUE PROM with end range holds  within protocol limitations   GH Jt mobs  Shoulder abd 2x10  08/14/23 UBE L 2 x3 min each  Shoulder Flex 2lb 2x10 Shoulder abd 1lb 2x10 LUE ER & IR yellow 2x10 Rows blue 2x15 Shoulder Ext red 2x10 Rows & Lats 20lb 2x12  STM to bilateral UT and cervical spine  08/12/23 UBE L 2 x3 min each  Shoulder Flex, Ext, IR 2lb WaTE x10 AAROM Shoulder Flex 2lb 2x10 Shoulder abd 1lb 2x10 Rows & Lats 20lb 2x12  Triceps Ext 15lb 2x10 Biceps curls 2lb 2x10  2 level cabinet reaches   07/30/23 UBE L2 x 3 min each Wall flex, Abd, CW/CCW w/ pillow case  LUE ER red 2x10 LUE IR red 2x10 Shoulder Flex 2lb 2x10 Shoulder abd 1lb 2x10 Rows & Lats 20lb 2x10 Shoulder Ext 5lb 2x5 Triceps Ext 10lb 2x10 2 level cabinet reaches  Biceps curls 2lb 2x10   07/28/23 UBE L2 x 3 min each Goals   MMT  Rows & Lats 20lb 2x10 Shoulder Ext blue 2x10 Horiz abs red 2x10 Triceps Ext 15lb 2x 10 STM to L UT, cervical spine, Deltoid  LUE PROM with end range holds  within protocol limitations  STM to L UT and cervical spine Supine LUE ER/IR 1lb x10 each  PATIENT EDUCATION: Education details: POC, goals Person educated: Patient and Spouse Education method:  Explanation, Demonstration, Tactile cues, Verbal cues, and Handouts Education comprehension: verbalized understanding, returned demonstration, and verbal cues required  HOME EXERCISE PROGRAM: Provided printed directions  ASSESSMENT:  CLINICAL IMPRESSION: Patient is a 68 y.o. female who was treated today by physical therapy due to recovery from L reverse TSA.  Despite meeting MMT and ROM goals she is still limited regarding her  total disability score. Continued to exercises to tolerance following Dr Rosea protocol.  Tactiel cues to elbow for positioning with ER/IR.  Some shoulder elevation with shoulder abduction and flexion. Horizontal abduction cause some UT tightness. Positive response to STM. She should benefit from skilled PT to address her recovery, she is well motivated and  cooperative.   OBJECTIVE IMPAIRMENTS: decreased activity tolerance, decreased ROM, decreased strength, impaired UE functional use, postural dysfunction, and pain.   ACTIVITY LIMITATIONS: carrying, lifting, sleeping, transfers, bathing, dressing, self feeding, and reach over head  PARTICIPATION LIMITATIONS: meal prep, cleaning, laundry, driving, community activity, and yard work  PERSONAL FACTORS: Age, Behavior pattern, Fitness, Past/current experiences, Time since onset of injury/illness/exacerbation, and 1-2 comorbidities: h/o falls, emphysema are also affecting patient's functional outcome.   REHAB POTENTIAL: Good  CLINICAL DECISION MAKING: Evolving/moderate complexity  EVALUATION COMPLEXITY: Moderate   GOALS: Goals reviewed with patient? Yes  SHORT TERM GOALS: Target date: 2 weeks 06/15/23  IHEP Baseline: Goal status: Met 07/23/23   LONG TERM GOALS: Target date: 08/24/23, 12 weeks  Total Disability Score: 59 / 80 = 73.8 % Total SPADI Score: 105 / 130 = 80.8 %: Improve to 20 % DISABLITY OR LESS  Baseline:  Goal status: On going 07/06/23, 07/23/23 Total Disability Score: 21 / 80 = 26.3 % Total SPADI  Score: 33 / 130 = 25.4 %, 08/27/23 Total Disability Score: 17 / 80 = 21.3 % SPADI Score: 30 / 130 = 23.1 %, 09/10/23 Total Disability Score: 20 / 80 = 25.0 % Total SPADI Score: 32 / 130 = 24.6 %  2.  Active ROM L shoulder elevation 130 or greater Baseline: na yet Goal status: Ongoing 07/06/23, Ongoing 07/22/24, Progressing 08/12/23, Progressing 08/27/23, 09/10/23 Met  3.  Strength L shoulder flex, abd, IR 5/5, ER 4-/5 Baseline: nt Goal status: Progressing 07/28/23, Progressing 08/27/23, Met 09/10/23   PLAN:  PT FREQUENCY: 2x/week  PT DURATION: 12 weeks  PLANNED INTERVENTIONS: 97110-Therapeutic exercises, 97530- Therapeutic activity, 97112- Neuromuscular re-education, 97535- Self Care, 02859- Manual therapy, and 97033- Ionotophoresis 4mg /ml Dexamethasone   PLAN FOR NEXT SESSION: continue with stretching PROM, manual techniques and modalities as needed to address pain management L shoulder   Tanda KANDICE Sorrow, PTA,  09/10/2023, 2:30 PM

## 2023-09-16 ENCOUNTER — Ambulatory Visit: Admitting: Physical Therapy

## 2023-09-20 DIAGNOSIS — M542 Cervicalgia: Secondary | ICD-10-CM | POA: Diagnosis not present

## 2023-09-21 ENCOUNTER — Other Ambulatory Visit: Payer: Self-pay

## 2023-09-21 ENCOUNTER — Ambulatory Visit

## 2023-09-21 DIAGNOSIS — R29898 Other symptoms and signs involving the musculoskeletal system: Secondary | ICD-10-CM

## 2023-09-21 DIAGNOSIS — M25612 Stiffness of left shoulder, not elsewhere classified: Secondary | ICD-10-CM

## 2023-09-21 DIAGNOSIS — M25512 Pain in left shoulder: Secondary | ICD-10-CM | POA: Diagnosis not present

## 2023-09-21 NOTE — Therapy (Signed)
 OUTPATIENT PHYSICAL THERAPY SHOULDER TREATMENT     Patient Name: Gabriella Farrell MRN: [id number] DOB:21-Mar-1955, 68 y.o., female Today's Date: 09/21/2023  END OF SESSION:  PT End of Session - 09/21/23 1430     Visit Number 19    Date for PT Re-Evaluation 09/27/23    Progress Note Due on Visit 20    PT Start Time 1430    PT Stop Time 1503    PT Time Calculation (min) 33 min    Activity Tolerance Patient tolerated treatment well;Patient limited by pain    Behavior During Therapy WFL for tasks assessed/performed           Past Medical History:  Diagnosis Date   Anxiety    Arthritis    Cancer (HCC)    lesion on right side of neck   Chronic kidney disease    COPD (chronic obstructive pulmonary disease) (HCC)    Depression    Discoid lupus erythematosus    GERD (gastroesophageal reflux disease)    H/O chronic pancreatitis    H/O ETOH abuse    Hypertension    Multiple pulmonary nodules    Stable per Pulmonology   SIADH (syndrome of inappropriate ADH production) (HCC)    Sleep apnea    no CPAP   Smoker    Past Surgical History:  Procedure Laterality Date   BREAST EXCISIONAL BIOPSY Right    CHOLECYSTECTOMY  2010   open cholecystectomy and also repaired ducts in pancreas   COLONOSCOPY W/ BIOPSIES AND POLYPECTOMY     x 5   ESOPHAGOGASTRODUODENOSCOPY     x 3   EYE SURGERY Bilateral 2020   cataract extractons   HARDWARE REMOVAL Left 04/30/2023   Procedure: REMOVAL, HARDWARE;  Surgeon: Melita Drivers, MD;  Location: [facility];  Service: Orthopedics;  Laterality: Left;   REVERSE SHOULDER ARTHROPLASTY Left 04/30/2023   Procedure: ARTHROPLASTY, SHOULDER, TOTAL, REVERSE;  Surgeon: Melita Drivers, MD;  Location: [facility];  Service: Orthopedics;  Laterality: Left;   SHOULDER SURGERY Left 2016   for Fx Humerus and dislocation   WISDOM TOOTH EXTRACTION     Patient Active Problem List   Diagnosis Date Noted   S/P reverse total shoulder arthroplasty, left 04/30/2023   Centrilobular  emphysema (HCC) 04/06/2023   Lung nodules 04/06/2023   Preoperative respiratory examination 04/06/2023   Tobacco use disorder 09/27/2018   Excessive daytime sleepiness 09/27/2018   Snoring 09/27/2018   Anxiety 12/04/2014   Depression 12/04/2014    PCP: Katina Pfeiffer, PA-C  REFERRING PROVIDER: Melita Drivers, MD  REFERRING DIAG: s/p removal of hardware and reverse TSA  THERAPY DIAG:  Weakness of left shoulder  Acute pain of left shoulder  Stiffness of left shoulder, not elsewhere classified  Rationale for Evaluation and Treatment: Rehabilitation  ONSET DATE: 04/30/23  SUBJECTIVE:  SUBJECTIVE STATEMENT: Today is a pretty bad day.  I woke up with deep ache/ pain L shoulder (points to prox ant shoulder) Can't get rid of it.  Had MRI of neck due to this constant pain and the numbness L hand thumb, index, finger, middle finger.   Hand dominance: Right  PERTINENT HISTORY: [place] nearly 8 years ago, had fracture L prox humerus, with ORIF, developed traumatic arthritis, underwent reverse TSA  PAIN:  Are you having pain? Yes: NPRS scale: 2/10 Pain location: L anterior shoulder Pain description: throbbing  Aggravating factors: lifting arm Relieving factors: ice, heat, meds, sling  PRECAUTIONS: Shoulder  RED FLAGS: None   WEIGHT BEARING RESTRICTIONS: Yes NWB L UE   FALLS:  Has patient fallen in last 6 months? No  LIVING ENVIRONMENT: Lives with: lives with their spouse Lives in: House/apartment Stairs: No Has following equipment at home: sling   OCCUPATION: retired  PLOF: Independent  PATIENT GOALS:able to move and use L arm without pain  NEXT MD VISIT:   OBJECTIVE:  Note: Objective measures were completed at Evaluation unless otherwise noted.  DIAGNOSTIC FINDINGS:  na  PATIENT  SURVEYS:  SPADI Total Disability Score: 59 / 80 = 73.8 % Total SPADI Score: 105 / 130 = 80.8 %   COGNITION: Overall cognitive status: Within functional limits for tasks assessed     SENSATION: Reports intermittent numbness and weakness L hand and wrist   POSTURE: Very lean female, non tension tremor noted Ue's and LEs  UPPER EXTREMITY ROM:   Passive ROM Left eval Left 06/24/23 AROM Left 07/06/23 AROM Left AROM 07/23/23 Left AROM 08/12/23 Left AROM 08/27/23 Left AROM 09/10/23  Shoulder flexion 88 86 89 110 119 125 133  Shoulder extension nt        Shoulder abduction 60 65 63 80 89 91 96  Shoulder adduction         Shoulder internal rotation nt        Shoulder external rotation 18        Elbow flexion wfl        Elbow extension wfl        Wrist flexion         Wrist extension         Wrist ulnar deviation         Wrist radial deviation         Wrist pronation         Wrist supination         (Blank rows =wfl)  UPPER EXTREMITY MMT:  MMT Left eval Left 07/28/23 Left  08/27/23 Left 09/10/23  Shoulder flexion nt 4- 4 4+  Shoulder extension nt     Shoulder abduction nt 3+ 4- 4  Shoulder adduction nt     Shoulder internal rotation nt 4- 4+ 5  Shoulder external rotation nt 3+ 4+ 5  Middle trapezius      Lower trapezius      Elbow flexion 3-     Elbow extension 3-     Wrist flexion      Wrist extension      Wrist ulnar deviation      Wrist radial deviation      Wrist pronation      Wrist supination      Grip strength (lbs)      (Blank rows = not tested)  PALPATION:  Tender, edematous medial upper arm Incision ant upper humerus healing, raised no redness or warmth noted  TREATMENT DATE: 09/22/23:  Supine for cross friciton massage L proximal biceps  Supine cervical manual distraction, also manual L upper traps and L levator scapula stretch, 6   Reps 2  bouts each Supine for manual resistance, rhythmic stabilization, light manual resistance, for L shoulder add, ext, IR/ER 2 bouts each Supine yellow t band B ER with L elbow propped on rolled towel to avoid excessive L shoulder extension Seated yellow t band triceps extension 15 x UBE level 1 , 3 F, 3 B    09/10/23 UBE L2.5 x 3 min each Goals Shoulder abd 1lb 2x10 Shoulder Flex 2lb 2x10  LUE ER/IR green 2x10 Standing rows blue 2x12 Horiz abd red 2x10 STM to UT & cervical spine  Cervical AROM  09/07/23 UBE L2.5 x 3 min each Pillow case on wall LUE flex, CW, CCW, Abd Seated Rows 25lb 2x10 Lat pull downs 25lb x 8, 20lb x8 LUE ER green 2x10, IR 2x10 Shoulder abd 1lb 2x10 Shoulder Flex 1lb 2x10 STM to UT & cervical spine  Cervical AROM  08/31/23 UBE L 2 x 3 in each 2 level cabinet reaches Flex 2lb, abd 1lb LUE x10 each LUE ER & IR green 2x10 Shoulder flex 2lb 2x10 Shoulder abd 1lb 2x10 Pillow case on wall LEU flex, CW, CCW, Abd Rows & Lats 20lb 2x12  Shoulder Ext 5lb 2x10 STM to cervical spine and UT  08/27/23 UBE L 2.5 x3 min each  GOALS Shoulder flex 2lb 2x10 Shoulder abd 1lb 2x10 LUE ER & IR green 2x10 Rows & Lats 20lb 2x12  STM to L UT, cervical spine, Deltoid  LUE PROM with end range holds  within protocol limitations Shoulder abd no weight 2x10  08/17/23 UBE L 2.5 x3 min each  Bilat shoulder Flex 3lb WaTE 2x10 LUE ER & IR red 2x10 Rows blue 2x10 Horiz abd red 2x10 Triceps Ext 15lb 2x10 Biceps curls 2lb 2x10 LUE PROM with end range holds  within protocol limitations   GH Jt mobs  Shoulder abd 2x10  08/14/23 UBE L 2 x3 min each  Shoulder Flex 2lb 2x10 Shoulder abd 1lb 2x10 LUE ER & IR yellow 2x10 Rows blue 2x15 Shoulder Ext red 2x10 Rows & Lats 20lb 2x12  STM to bilateral UT and cervical spine  08/12/23 UBE L 2 x3 min each  Shoulder Flex, Ext, IR 2lb WaTE x10 AAROM Shoulder Flex 2lb 2x10 Shoulder abd 1lb 2x10 Rows & Lats 20lb 2x12  Triceps Ext 15lb  2x10 Biceps curls 2lb 2x10  2 level cabinet reaches   07/30/23 UBE L2 x 3 min each Wall flex, Abd, CW/CCW w/ pillow case  LUE ER red 2x10 LUE IR red 2x10 Shoulder Flex 2lb 2x10 Shoulder abd 1lb 2x10 Rows & Lats 20lb 2x10 Shoulder Ext 5lb 2x5 Triceps Ext 10lb 2x10 2 level cabinet reaches  Biceps curls 2lb 2x10   07/28/23 UBE L2 x 3 min each Goals   MMT  Rows & Lats 20lb 2x10 Shoulder Ext blue 2x10 Horiz abs red 2x10 Triceps Ext 15lb 2x 10 STM to L UT, cervical spine, Deltoid  LUE PROM with end range holds  within protocol limitations  STM to L UT and cervical spine Supine LUE ER/IR 1lb x10 each  PATIENT EDUCATION: Education details: POC, goals Person educated: Patient and Spouse Education method: Explanation, Demonstration, Tactile cues, Verbal cues, and Handouts Education comprehension: verbalized understanding, returned demonstration, and verbal cues required  HOME EXERCISE PROGRAM: Provided printed directions  ASSESSMENT:  CLINICAL IMPRESSION: Patient  is a 68 y.o. female who participated in a physical therapy session today to recovery from L reverse TSA. She is now 5 months post op .  She is reporting intractable pain L ant prox humerus that does not change regardless of position or activity level.  Today we utilized very gentle manual techniques and light exercise.  She only completed  30 min of her scheduled 45 min session, due to her high pain levels.  She is to follow up with the MD regarding her MRI results at end of this week.  Her pain level is very high at this point in her recovery.  Despite meeting MMT and ROM goals she is still limited regarding her  total disability score.  she is well motivated and cooperative, hopefully will get good information when she follows up regarding her MRI results.  OBJECTIVE IMPAIRMENTS: decreased activity tolerance, decreased ROM, decreased strength, impaired UE functional use, postural dysfunction, and pain.   ACTIVITY  LIMITATIONS: carrying, lifting, sleeping, transfers, bathing, dressing, self feeding, and reach over head  PARTICIPATION LIMITATIONS: meal prep, cleaning, laundry, driving, community activity, and yard work  PERSONAL FACTORS: Age, Behavior pattern, Fitness, Past/current experiences, Time since onset of injury/illness/exacerbation, and 1-2 comorbidities: h/o falls, emphysema are also affecting patient's functional outcome.   REHAB POTENTIAL: Good  CLINICAL DECISION MAKING: Evolving/moderate complexity  EVALUATION COMPLEXITY: Moderate   GOALS: Goals reviewed with patient? Yes  SHORT TERM GOALS: Target date: 2 weeks 06/15/23  IHEP Baseline: Goal status: Met 07/23/23   LONG TERM GOALS: Target date: 08/24/23, 12 weeks  Total Disability Score: 59 / 80 = 73.8 % Total SPADI Score: 105 / 130 = 80.8 %: Improve to 20 % DISABLITY OR LESS  Baseline:  Goal status: On going 07/06/23, 07/23/23 Total Disability Score: 21 / 80 = 26.3 % Total SPADI Score: 33 / 130 = 25.4 %, 08/27/23 Total Disability Score: 17 / 80 = 21.3 % SPADI Score: 30 / 130 = 23.1 %, 09/10/23 Total Disability Score: 20 / 80 = 25.0 % Total SPADI Score: 32 / 130 = 24.6 %  2.  Active ROM L shoulder elevation 130 or greater Baseline: na yet Goal status: Ongoing 07/06/23, Ongoing 07/22/24, Progressing 08/12/23, Progressing 08/27/23, 09/10/23 Met  3.  Strength L shoulder flex, abd, IR 5/5, ER 4-/5 Baseline: nt Goal status: Progressing 07/28/23, Progressing 08/27/23, Met 09/10/23   PLAN:  PT FREQUENCY: 2x/week  PT DURATION: 12 weeks  PLANNED INTERVENTIONS: 97110-Therapeutic exercises, 97530- Therapeutic activity, 97112- Neuromuscular re-education, 97535- Self Care, 02859- Manual therapy, and 97033- Ionotophoresis 4mg /ml Dexamethasone   PLAN FOR NEXT SESSION: continue with stretching PROM, manual techniques and modalities as needed to address pain management L shoulder   Jaimere Feutz L Joyia Riehle, PT, DPT, OCS 09/21/2023, 3:12 PM

## 2023-09-23 ENCOUNTER — Ambulatory Visit: Admitting: Physical Therapy

## 2023-09-23 ENCOUNTER — Encounter: Payer: Self-pay | Admitting: Physical Therapy

## 2023-09-23 DIAGNOSIS — M25512 Pain in left shoulder: Secondary | ICD-10-CM | POA: Diagnosis not present

## 2023-09-23 DIAGNOSIS — R29898 Other symptoms and signs involving the musculoskeletal system: Secondary | ICD-10-CM

## 2023-09-23 DIAGNOSIS — M25612 Stiffness of left shoulder, not elsewhere classified: Secondary | ICD-10-CM

## 2023-09-23 NOTE — Therapy (Signed)
 OUTPATIENT PHYSICAL THERAPY SHOULDER PROGRESS NOTE      Patient Name: Gabriella Farrell MRN: [id number] DOB:[birth year], 68 y.o., female Today's Date: 09/23/2023  Progress Note Reporting Period 07/29/23 to 09/23/23  See note below for Objective Data and Assessment of Progress/Goals.      END OF SESSION:  PT End of Session - 09/23/23 1325     Visit Number 20    Number of Visits 28    Date for PT Re-Evaluation 10/21/23    Authorization Type [employer] M[employer]    Progress Note Due on Visit 30    PT Start Time 1302    PT Stop Time 1340    PT Time Calculation (min) 38 min    Activity Tolerance Patient tolerated treatment well;Patient limited by pain    Behavior During Therapy WFL for tasks assessed/performed            Past Medical History:  Diagnosis Date   Anxiety    Arthritis    Cancer (HCC)    lesion on right side of neck   Chronic kidney disease    COPD (chronic obstructive pulmonary disease) (HCC)    Depression    Discoid lupus erythematosus    GERD (gastroesophageal reflux disease)    H/O chronic pancreatitis    H/O ETOH abuse    Hypertension    Multiple pulmonary nodules    Stable per Pulmonology   SIADH (syndrome of inappropriate ADH production) (HCC)    Sleep apnea    no CPAP   Smoker    Past Surgical History:  Procedure Laterality Date   BREAST EXCISIONAL BIOPSY Right    CHOLECYSTECTOMY  2010   open cholecystectomy and also repaired ducts in pancreas   COLONOSCOPY W/ BIOPSIES AND POLYPECTOMY     x 5   ESOPHAGOGASTRODUODENOSCOPY     x 3   EYE SURGERY Bilateral 2020   cataract extractons   HARDWARE REMOVAL Left 04/30/2023   Procedure: REMOVAL, HARDWARE;  Surgeon: Melita Drivers, MD;  Location: [facility];  Service: Orthopedics;  Laterality: Left;   REVERSE SHOULDER ARTHROPLASTY Left 04/30/2023   Procedure: ARTHROPLASTY, SHOULDER, TOTAL, REVERSE;  Surgeon: Melita Drivers, MD;  Location: [facility];  Service: Orthopedics;  Laterality: Left;   SHOULDER SURGERY Left  2016   for Fx Humerus and dislocation   WISDOM TOOTH EXTRACTION     Patient Active Problem List   Diagnosis Date Noted   S/P reverse total shoulder arthroplasty, left 04/30/2023   Centrilobular emphysema (HCC) 04/06/2023   Lung nodules 04/06/2023   Preoperative respiratory examination 04/06/2023   Tobacco use disorder 09/27/2018   Excessive daytime sleepiness 09/27/2018   Snoring 09/27/2018   Anxiety 12/04/2014   Depression 12/04/2014    PCP: Katina Pfeiffer, PA-C  REFERRING PROVIDER: Melita Drivers, MD  REFERRING DIAG: s/p removal of hardware and reverse TSA  THERAPY DIAG:  Weakness of left shoulder - Plan: PT plan of care cert/re-cert  Acute pain of left shoulder - Plan: PT plan of care cert/re-cert  Stiffness of left shoulder, not elsewhere classified - Plan: PT plan of care cert/re-cert  Rationale for Evaluation and Treatment: Rehabilitation  ONSET DATE: 04/30/23  SUBJECTIVE:  SUBJECTIVE STATEMENT:  Had MRI on Sunday, seeing the nerve doctor on Thursday and the shoulder doctor on September. Pain radiates from neck down, not really sure about aggravating or easing factors for pain    Hand dominance: Right  PERTINENT HISTORY: [place] nearly 8 years ago, had fracture L prox humerus, with ORIF, developed traumatic arthritis, underwent reverse TSA  PAIN:  Are you having pain? Yes: NPRS scale: 3/10 Pain location: L anterior shoulder Pain description: throbbing in shoulder, more numbness than usual in first 3 fingers and wrist   Aggravating factors: extending shoulder  Relieving factors: not extending shoulder behind her  PRECAUTIONS: Shoulder  RED FLAGS: None   WEIGHT BEARING RESTRICTIONS: Yes NWB L UE   FALLS:  Has patient fallen in last 6 months? No  LIVING ENVIRONMENT: Lives  with: lives with their spouse Lives in: House/apartment Stairs: No Has following equipment at home: sling   OCCUPATION: retired  PLOF: Independent  PATIENT GOALS:able to move and use L arm without pain  NEXT MD VISIT:   OBJECTIVE:  Note: Objective measures were completed at Evaluation unless otherwise noted.  DIAGNOSTIC FINDINGS:  na  PATIENT SURVEYS:  SPADI Total Disability Score: 59 / 80 = 73.8 %; 09/23/23- 23.8% Total SPADI Score: 105 / 130 = 80.8 %; 09/23/23 31/130   COGNITION: Overall cognitive status: Within functional limits for tasks assessed     SENSATION: Reports intermittent numbness and weakness L hand and wrist   POSTURE: Very lean female, non tension tremor noted Ue's and LEs  09/23/23 thoracic kyphosis, scapular winging noted at rest   UPPER EXTREMITY ROM:   Passive ROM Left eval Left 06/24/23 AROM Left 07/06/23 AROM Left AROM 07/23/23 Left AROM 08/12/23 Left AROM 08/27/23 Left AROM 09/10/23 Left AROM 09/23/23  Shoulder flexion 88 86 89 110 119 125 133 138*  Shoulder extension nt         Shoulder abduction 60 65 63 80 89 91 96 100*  Shoulder adduction          Shoulder internal rotation nt         Shoulder external rotation 18         Elbow flexion wfl         Elbow extension wfl         Wrist flexion          Wrist extension          Wrist ulnar deviation          Wrist radial deviation          Wrist pronation          Wrist supination          (Blank rows =wfl)  UPPER EXTREMITY MMT:  MMT Left eval Left 07/28/23 Left  08/27/23 Left 09/10/23 Left 09/23/23  Shoulder flexion nt 4- 4 4+ 4- pain limited   Shoulder extension nt      Shoulder abduction nt 3+ 4- 4 3+ pain limited   Shoulder adduction nt      Shoulder internal rotation nt 4- 4+ 5 4-  Shoulder external rotation nt 3+ 4+ 5 4+  Middle trapezius       Lower trapezius       Elbow flexion 3-      Elbow extension 3-      Wrist flexion       Wrist extension       Wrist ulnar deviation        Wrist radial deviation  Wrist pronation       Wrist supination       Grip strength (lbs)       (Blank rows = not tested)  PALPATION:  Tender, edematous medial upper arm Incision ant upper humerus healing, raised no redness or warmth noted  09/23/23  Lots of trigger points and spasms noted in L delts                                                                                                                              TREATMENT DATE:  09/23/23  MMT, ROM, SPADI, goals for 20th visit progress note, education on POC and progress moving forward as well as likely cx involvement   Chin tucks 10x3 seconds Chin tucks + extension x10- increased radicular sx  Supine shoulder flexion 2# 2x10 L Supine serratus punch 2# 2x10 L  UBE L1 x3 min forward, 3 min backward      PATIENT EDUCATION: Education details: POC, goals Person educated: Patient and Spouse Education method: Explanation, Demonstration, Tactile cues, Verbal cues, and Handouts Education comprehension: verbalized understanding, returned demonstration, and verbal cues required  HOME EXERCISE PROGRAM: Provided printed directions  ASSESSMENT:  CLINICAL IMPRESSION:   Arrives today doing OK, reports she feels she does have good functional use of the arm/shoulder but pain is really still a problem. Updated objectives/goals for 20th visit progress note. ROM is slowly improving, strength however remains limited by pain. She sees her shoulder doctor in September, MRI results are pending. Will continue with skilled PT services moving forward, however will need to consider transition to independent exercise program especially given hx of past trauma/surgery to shoulder and now possible complicating factor of cervical radic. Will await MRI results- she does very much present with likely cervical involvement.   OBJECTIVE IMPAIRMENTS: decreased activity tolerance, decreased ROM, decreased strength, impaired UE functional use,  postural dysfunction, and pain.   ACTIVITY LIMITATIONS: carrying, lifting, sleeping, transfers, bathing, dressing, self feeding, and reach over head  PARTICIPATION LIMITATIONS: meal prep, cleaning, laundry, driving, community activity, and yard work  PERSONAL FACTORS: Age, Behavior pattern, Fitness, Past/current experiences, Time since onset of injury/illness/exacerbation, and 1-2 comorbidities: h/o falls, emphysema are also affecting patient's functional outcome.   REHAB POTENTIAL: Good  CLINICAL DECISION MAKING: Evolving/moderate complexity  EVALUATION COMPLEXITY: Moderate   GOALS: Goals reviewed with patient? Yes  SHORT TERM GOALS: Target date: 10/07/2023    IHEP Baseline: Goal status: Met 07/23/23   LONG TERM GOALS: Target date: 10/21/2023    Total Disability Score: 59 / 80 = 73.8 % Total SPADI Score: 105 / 130 = 80.8 %: Improve to 20 % DISABLITY OR LESS  Baseline:  Goal status: ONGOING 09/23/23 see above   2.  Active ROM L shoulder elevation 130 or greater Baseline: na yet Goal status: Ongoing 07/06/23, Ongoing 07/22/24, Progressing 08/12/23, Progressing 08/27/23, 09/10/23 Met  3.  Strength L shoulder flex, abd, IR 5/5, ER 4-/5 Baseline: nt Goal status:  ONGOING 09/23/23- fluctuating based on pain levels (see chart above)   PLAN:  PT FREQUENCY: 2x/week  PT DURATION: 4 weeks  PLANNED INTERVENTIONS: 97110-Therapeutic exercises, 97530- Therapeutic activity, [id number]- Neuromuscular re-education, 97535- Self Care, 02859- Manual therapy, and 97033- Ionotophoresis 4mg /ml Dexamethasone   PLAN FOR NEXT SESSION: continue with stretching PROM, manual techniques and modalities as needed to address pain management L shoulder. Any word on MRI results? DC to independent program if no significant forward progress after another month of skilled services    Josette Rough, PT, DPT 09/23/23 1:42 PM

## 2023-09-25 DIAGNOSIS — E222 Syndrome of inappropriate secretion of antidiuretic hormone: Secondary | ICD-10-CM | POA: Diagnosis not present

## 2023-09-28 ENCOUNTER — Ambulatory Visit: Admitting: Physical Therapy

## 2023-09-28 ENCOUNTER — Encounter: Payer: Self-pay | Admitting: Physical Therapy

## 2023-09-28 DIAGNOSIS — M25512 Pain in left shoulder: Secondary | ICD-10-CM

## 2023-09-28 DIAGNOSIS — M25612 Stiffness of left shoulder, not elsewhere classified: Secondary | ICD-10-CM

## 2023-09-28 DIAGNOSIS — R29898 Other symptoms and signs involving the musculoskeletal system: Secondary | ICD-10-CM

## 2023-09-28 NOTE — Therapy (Signed)
 OUTPATIENT PHYSICAL THERAPY SHOULDER PROGRESS NOTE      Patient Name: Gabriella Farrell MRN: [id number] DOB:12-Jan-1956, 68 y.o., female Today's Date: 09/28/2023  Progress Note Reporting Period 07/29/23 to 09/23/23  See note below for Objective Data and Assessment of Progress/Goals.      END OF SESSION:  PT End of Session - 09/28/23 1058     Visit Number 21    Date for PT Re-Evaluation 10/21/23    PT Start Time 1100    PT Stop Time 1145    PT Time Calculation (min) 45 min    Activity Tolerance Patient tolerated treatment well;Patient limited by pain    Behavior During Therapy WFL for tasks assessed/performed            Past Medical History:  Diagnosis Date   Anxiety    Arthritis    Cancer (HCC)    lesion on right side of neck   Chronic kidney disease    COPD (chronic obstructive pulmonary disease) (HCC)    Depression    Discoid lupus erythematosus    GERD (gastroesophageal reflux disease)    H/O chronic pancreatitis    H/O ETOH abuse    Hypertension    Multiple pulmonary nodules    Stable per Pulmonology   SIADH (syndrome of inappropriate ADH production) (HCC)    Sleep apnea    no CPAP   Smoker    Past Surgical History:  Procedure Laterality Date   BREAST EXCISIONAL BIOPSY Right    CHOLECYSTECTOMY  2010   open cholecystectomy and also repaired ducts in pancreas   COLONOSCOPY W/ BIOPSIES AND POLYPECTOMY     x 5   ESOPHAGOGASTRODUODENOSCOPY     x 3   EYE SURGERY Bilateral 2020   cataract extractons   HARDWARE REMOVAL Left 04/30/2023   Procedure: REMOVAL, HARDWARE;  Surgeon: Melita Drivers, MD;  Location: [facility];  Service: Orthopedics;  Laterality: Left;   REVERSE SHOULDER ARTHROPLASTY Left 04/30/2023   Procedure: ARTHROPLASTY, SHOULDER, TOTAL, REVERSE;  Surgeon: Melita Drivers, MD;  Location: [facility];  Service: Orthopedics;  Laterality: Left;   SHOULDER SURGERY Left 2016   for Fx Humerus and dislocation   WISDOM TOOTH EXTRACTION     Patient Active  Problem List   Diagnosis Date Noted   S/P reverse total shoulder arthroplasty, left 04/30/2023   Centrilobular emphysema (HCC) 04/06/2023   Lung nodules 04/06/2023   Preoperative respiratory examination 04/06/2023   Tobacco use disorder 09/27/2018   Excessive daytime sleepiness 09/27/2018   Snoring 09/27/2018   Anxiety 12/04/2014   Depression 12/04/2014    PCP: Katina Pfeiffer, PA-C  REFERRING PROVIDER: Melita Drivers, MD  REFERRING DIAG: s/p removal of hardware and reverse TSA  THERAPY DIAG:  Weakness of left shoulder  Acute pain of left shoulder  Stiffness of left shoulder, not elsewhere classified  Rationale for Evaluation and Treatment: Rehabilitation  ONSET DATE: 04/30/23  SUBJECTIVE:  SUBJECTIVE STATEMENT:  Rough weekend but feels great today, couldn't get a bunch done    Hand dominance: Right  PERTINENT HISTORY: [place] nearly 8 years ago, had fracture L prox humerus, with ORIF, developed traumatic arthritis, underwent reverse TSA  PAIN:  Are you having pain? Yes: NPRS scale: 1/10 Pain location: L anterior shoulder Pain description: throbbing in shoulder, more numbness than usual in first 3 fingers and wrist   Aggravating factors: extending shoulder  Relieving factors: not extending shoulder behind her  PRECAUTIONS: Shoulder  RED FLAGS: None   WEIGHT BEARING RESTRICTIONS: Yes NWB L UE   FALLS:  Has patient fallen in last 6 months? No  LIVING ENVIRONMENT: Lives with: lives with their spouse Lives in: House/apartment Stairs: No Has following equipment at home: sling   OCCUPATION: retired  PLOF: Independent  PATIENT GOALS:able to move and use L arm without pain  NEXT MD VISIT:   OBJECTIVE:  Note: Objective measures were completed at Evaluation unless otherwise  noted.  DIAGNOSTIC FINDINGS:  na  PATIENT SURVEYS:  SPADI Total Disability Score: 59 / 80 = 73.8 %; 09/23/23- 23.8% Total SPADI Score: 105 / 130 = 80.8 %; 09/23/23 31/130   COGNITION: Overall cognitive status: Within functional limits for tasks assessed     SENSATION: Reports intermittent numbness and weakness L hand and wrist   POSTURE: Very lean female, non tension tremor noted Ue's and LEs  09/23/23 thoracic kyphosis, scapular winging noted at rest   UPPER EXTREMITY ROM:   Passive ROM Left eval Left 06/24/23 AROM Left 07/06/23 AROM Left AROM 07/23/23 Left AROM 08/12/23 Left AROM 08/27/23 Left AROM 09/10/23 Left AROM 09/23/23  Shoulder flexion 88 86 89 110 119 125 133 138*  Shoulder extension nt         Shoulder abduction 60 65 63 80 89 91 96 100*  Shoulder adduction          Shoulder internal rotation nt         Shoulder external rotation 18         Elbow flexion wfl         Elbow extension wfl         Wrist flexion          Wrist extension          Wrist ulnar deviation          Wrist radial deviation          Wrist pronation          Wrist supination          (Blank rows =wfl)  UPPER EXTREMITY MMT:  MMT Left eval Left 07/28/23 Left  08/27/23 Left 09/10/23 Left 09/23/23  Shoulder flexion nt 4- 4 4+ 4- pain limited   Shoulder extension nt      Shoulder abduction nt 3+ 4- 4 3+ pain limited   Shoulder adduction nt      Shoulder internal rotation nt 4- 4+ 5 4-  Shoulder external rotation nt 3+ 4+ 5 4+  Middle trapezius       Lower trapezius       Elbow flexion 3-      Elbow extension 3-      Wrist flexion       Wrist extension       Wrist ulnar deviation       Wrist radial deviation       Wrist pronation       Wrist supination  Grip strength (lbs)       (Blank rows = not tested)  PALPATION:  Tender, edematous medial upper arm Incision ant upper humerus healing, raised no redness or warmth noted  09/23/23  Lots of trigger points and spasms noted in L delts                                                                                                                               TREATMENT DATE: 09/28/23 NuStep L5 x 5 min Shoulder Ext 5lb 2x10 Shoulder abd 2lb 2x10   Flex x10 Row & Lats 20lb 2x10  LUE ER/IR yellow 2x10 Chin tucks 10x3 seconds Triceps Ext 20lb 2x10    09/23/23  MMT, ROM, SPADI, goals for 20th visit progress note, education on POC and progress moving forward as well as likely cx involvement   Chin tucks 10x3 seconds Chin tucks + extension x10- increased radicular sx  Supine shoulder flexion 2# 2x10 L Supine serratus punch 2# 2x10 L  UBE L1 x3 min forward, 3 min backward      PATIENT EDUCATION: Education details: POC, goals Person educated: Patient and Spouse Education method: Explanation, Demonstration, Tactile cues, Verbal cues, and Handouts Education comprehension: verbalized understanding, returned demonstration, and verbal cues required  HOME EXERCISE PROGRAM: Provided printed directions  ASSESSMENT:  CLINICAL IMPRESSION:   Arrives today doing OK, reports she feels she does have good functional use of the arm/shoulder but pain is really still a problem. She sees her shoulder doctor this week for MRI results, due to radiating symptoms down LUE. All interventions completed well. Some compensation with LUE ER/IR.  Will continue with skilled PT services moving forward, however will need to consider transition to independent exercise program especially given hx of past trauma/surgery to shoulder and now possible complicating factor of cervical radic. Will await MRI results- she does very much present with likely cervical involvement.   OBJECTIVE IMPAIRMENTS: decreased activity tolerance, decreased ROM, decreased strength, impaired UE functional use, postural dysfunction, and pain.   ACTIVITY LIMITATIONS: carrying, lifting, sleeping, transfers, bathing, dressing, self feeding, and reach over  head  PARTICIPATION LIMITATIONS: meal prep, cleaning, laundry, driving, community activity, and yard work  PERSONAL FACTORS: Age, Behavior pattern, Fitness, Past/current experiences, Time since onset of injury/illness/exacerbation, and 1-2 comorbidities: h/o falls, emphysema are also affecting patient's functional outcome.   REHAB POTENTIAL: Good  CLINICAL DECISION MAKING: Evolving/moderate complexity  EVALUATION COMPLEXITY: Moderate   GOALS: Goals reviewed with patient? Yes  SHORT TERM GOALS: Target date: 10/07/2023    IHEP Baseline: Goal status: Met 07/23/23   LONG TERM GOALS: Target date: 10/21/2023    Total Disability Score: 59 / 80 = 73.8 % Total SPADI Score: 105 / 130 = 80.8 %: Improve to 20 % DISABLITY OR LESS  Baseline:  Goal status: ONGOING 09/23/23 see above   2.  Active ROM L shoulder elevation 130 or greater Baseline: na yet Goal status: Ongoing 07/06/23, Ongoing 07/22/24, Progressing  08/12/23, Progressing 08/27/23, 09/10/23 Met  3.  Strength L shoulder flex, abd, IR 5/5, ER 4-/5 Baseline: nt Goal status: ONGOING 09/23/23- fluctuating based on pain levels (see chart above)   PLAN:  PT FREQUENCY: 2x/week  PT DURATION: 4 weeks  PLANNED INTERVENTIONS: 97110-Therapeutic exercises, 97530- Therapeutic activity, [id number]- Neuromuscular re-education, 97535- Self Care, 02859- Manual therapy, and 97033- Ionotophoresis 4mg /ml Dexamethasone   PLAN FOR NEXT SESSION: continue with stretching PROM, manual techniques and modalities as needed to address pain management L shoulder. Any word on MRI results? DC to independent program if no significant forward progress after another month of skilled services    Tanda Sorrow, PTA 09/28/23 10:58 AM

## 2023-09-30 DIAGNOSIS — M5412 Radiculopathy, cervical region: Secondary | ICD-10-CM | POA: Diagnosis not present

## 2023-10-01 ENCOUNTER — Ambulatory Visit: Admitting: Physical Therapy

## 2023-10-15 DIAGNOSIS — M5412 Radiculopathy, cervical region: Secondary | ICD-10-CM | POA: Diagnosis not present

## 2023-10-29 DIAGNOSIS — R200 Anesthesia of skin: Secondary | ICD-10-CM | POA: Diagnosis not present

## 2023-11-02 DIAGNOSIS — Z96612 Presence of left artificial shoulder joint: Secondary | ICD-10-CM | POA: Diagnosis not present

## 2023-11-04 ENCOUNTER — Telehealth: Payer: Self-pay | Admitting: Emergency Medicine

## 2023-11-04 DIAGNOSIS — G5602 Carpal tunnel syndrome, left upper limb: Secondary | ICD-10-CM | POA: Diagnosis not present

## 2023-11-04 DIAGNOSIS — G5603 Carpal tunnel syndrome, bilateral upper limbs: Secondary | ICD-10-CM | POA: Diagnosis not present

## 2023-11-04 NOTE — Telephone Encounter (Signed)
 CT with contrast is booked 11/09/2023. Per scheduler, labs required to show creatinine levels because of the age of pt, prior to having this scan. Please advise.

## 2023-11-05 DIAGNOSIS — G5621 Lesion of ulnar nerve, right upper limb: Secondary | ICD-10-CM | POA: Diagnosis not present

## 2023-11-05 DIAGNOSIS — M5412 Radiculopathy, cervical region: Secondary | ICD-10-CM | POA: Diagnosis not present

## 2023-11-09 ENCOUNTER — Ambulatory Visit (HOSPITAL_BASED_OUTPATIENT_CLINIC_OR_DEPARTMENT_OTHER)
Admission: RE | Admit: 2023-11-09 | Discharge: 2023-11-09 | Disposition: A | Discharge status: Home / Self Care | Source: Ambulatory Visit | Attending: Emergency Medicine | Admitting: Emergency Medicine

## 2023-11-09 ENCOUNTER — Encounter (HOSPITAL_BASED_OUTPATIENT_CLINIC_OR_DEPARTMENT_OTHER): Payer: Self-pay

## 2023-11-09 DIAGNOSIS — R918 Other nonspecific abnormal finding of lung field: Secondary | ICD-10-CM | POA: Insufficient documentation

## 2023-11-09 DIAGNOSIS — D3501 Benign neoplasm of right adrenal gland: Secondary | ICD-10-CM | POA: Diagnosis not present

## 2023-11-09 DIAGNOSIS — J432 Centrilobular emphysema: Secondary | ICD-10-CM | POA: Diagnosis not present

## 2023-11-09 MED ORDER — IOHEXOL 300 MG/ML  SOLN
100.0000 mL | Freq: Once | INTRAMUSCULAR | Status: AC | PRN
Start: 2023-11-09 — End: 2023-11-09
  Administered 2023-11-09: 80 mL via INTRAVENOUS

## 2023-11-09 NOTE — Telephone Encounter (Signed)
 No that's ok if the scan has been done

## 2023-11-09 NOTE — Addendum Note (Signed)
 Addended byBETHA FRIES, Jayceion Lisenby A on: 11/09/2023 12:24 PM   Modules accepted: Orders

## 2023-11-09 NOTE — Telephone Encounter (Addendum)
 ATC x1. Lvm to call back  Looks like pt has already arrived for CT scan. Will await call back.

## 2023-11-09 NOTE — Telephone Encounter (Signed)
 Dr. Shelah can you please advise of lab orders.

## 2023-11-09 NOTE — Telephone Encounter (Signed)
 Yes please check BMP prior to CT chest

## 2023-11-09 NOTE — Telephone Encounter (Signed)
 Pt returning phone call.  Pt was able to have CT scan without labs being drawn.  Dr. Shelah would you still like pt to have BMP checked?  Note to self: Lab order needs to be placed to [facility]

## 2023-11-09 NOTE — Telephone Encounter (Signed)
 Pt is aware no labs are needed. Scheduled pt ov to review scan.  Nfn

## 2023-11-12 DIAGNOSIS — G5603 Carpal tunnel syndrome, bilateral upper limbs: Secondary | ICD-10-CM | POA: Diagnosis not present

## 2023-11-14 ENCOUNTER — Emergency Department (HOSPITAL_COMMUNITY)

## 2023-11-14 ENCOUNTER — Encounter (HOSPITAL_COMMUNITY): Payer: Self-pay

## 2023-11-14 ENCOUNTER — Inpatient Hospital Stay (HOSPITAL_COMMUNITY)
Admission: EM | Admit: 2023-11-14 | Discharge: 2023-11-19 | DRG: 481 | Disposition: A | Discharge status: Home Health | Attending: Family Medicine | Admitting: Family Medicine

## 2023-11-14 ENCOUNTER — Other Ambulatory Visit: Payer: Self-pay

## 2023-11-14 DIAGNOSIS — Z79899 Other long term (current) drug therapy: Secondary | ICD-10-CM | POA: Diagnosis not present

## 2023-11-14 DIAGNOSIS — F17210 Nicotine dependence, cigarettes, uncomplicated: Secondary | ICD-10-CM | POA: Diagnosis not present

## 2023-11-14 DIAGNOSIS — E538 Deficiency of other specified B group vitamins: Secondary | ICD-10-CM | POA: Diagnosis not present

## 2023-11-14 DIAGNOSIS — I10 Essential (primary) hypertension: Secondary | ICD-10-CM | POA: Diagnosis not present

## 2023-11-14 DIAGNOSIS — Z96612 Presence of left artificial shoulder joint: Secondary | ICD-10-CM | POA: Diagnosis not present

## 2023-11-14 DIAGNOSIS — R059 Cough, unspecified: Secondary | ICD-10-CM | POA: Diagnosis not present

## 2023-11-14 DIAGNOSIS — E871 Hypo-osmolality and hyponatremia: Secondary | ICD-10-CM | POA: Diagnosis not present

## 2023-11-14 DIAGNOSIS — M79652 Pain in left thigh: Secondary | ICD-10-CM | POA: Diagnosis not present

## 2023-11-14 DIAGNOSIS — K700 Alcoholic fatty liver: Secondary | ICD-10-CM | POA: Insufficient documentation

## 2023-11-14 DIAGNOSIS — Z7951 Long term (current) use of inhaled steroids: Secondary | ICD-10-CM | POA: Diagnosis not present

## 2023-11-14 DIAGNOSIS — M25562 Pain in left knee: Secondary | ICD-10-CM | POA: Diagnosis not present

## 2023-11-14 DIAGNOSIS — D509 Iron deficiency anemia, unspecified: Secondary | ICD-10-CM | POA: Diagnosis not present

## 2023-11-14 DIAGNOSIS — D539 Nutritional anemia, unspecified: Secondary | ICD-10-CM

## 2023-11-14 DIAGNOSIS — W19XXXA Unspecified fall, initial encounter: Secondary | ICD-10-CM | POA: Diagnosis not present

## 2023-11-14 DIAGNOSIS — J449 Chronic obstructive pulmonary disease, unspecified: Secondary | ICD-10-CM | POA: Diagnosis not present

## 2023-11-14 DIAGNOSIS — R918 Other nonspecific abnormal finding of lung field: Secondary | ICD-10-CM | POA: Diagnosis present

## 2023-11-14 DIAGNOSIS — R636 Underweight: Secondary | ICD-10-CM | POA: Diagnosis present

## 2023-11-14 DIAGNOSIS — G8929 Other chronic pain: Secondary | ICD-10-CM | POA: Diagnosis not present

## 2023-11-14 DIAGNOSIS — F1010 Alcohol abuse, uncomplicated: Secondary | ICD-10-CM | POA: Diagnosis not present

## 2023-11-14 DIAGNOSIS — K8681 Exocrine pancreatic insufficiency: Secondary | ICD-10-CM | POA: Diagnosis not present

## 2023-11-14 DIAGNOSIS — F419 Anxiety disorder, unspecified: Secondary | ICD-10-CM | POA: Diagnosis present

## 2023-11-14 DIAGNOSIS — K860 Alcohol-induced chronic pancreatitis: Secondary | ICD-10-CM | POA: Diagnosis present

## 2023-11-14 DIAGNOSIS — K219 Gastro-esophageal reflux disease without esophagitis: Secondary | ICD-10-CM | POA: Diagnosis present

## 2023-11-14 DIAGNOSIS — S72142A Displaced intertrochanteric fracture of left femur, initial encounter for closed fracture: Secondary | ICD-10-CM | POA: Diagnosis not present

## 2023-11-14 DIAGNOSIS — Z91030 Bee allergy status: Secondary | ICD-10-CM

## 2023-11-14 DIAGNOSIS — K7581 Nonalcoholic steatohepatitis (NASH): Secondary | ICD-10-CM | POA: Diagnosis present

## 2023-11-14 DIAGNOSIS — L930 Discoid lupus erythematosus: Secondary | ICD-10-CM | POA: Diagnosis not present

## 2023-11-14 DIAGNOSIS — W109XXA Fall (on) (from) unspecified stairs and steps, initial encounter: Secondary | ICD-10-CM | POA: Diagnosis not present

## 2023-11-14 DIAGNOSIS — F32A Depression, unspecified: Secondary | ICD-10-CM | POA: Diagnosis not present

## 2023-11-14 DIAGNOSIS — Z85820 Personal history of malignant melanoma of skin: Secondary | ICD-10-CM

## 2023-11-14 DIAGNOSIS — K5730 Diverticulosis of large intestine without perforation or abscess without bleeding: Secondary | ICD-10-CM | POA: Insufficient documentation

## 2023-11-14 DIAGNOSIS — E785 Hyperlipidemia, unspecified: Secondary | ICD-10-CM | POA: Diagnosis not present

## 2023-11-14 DIAGNOSIS — S72002A Fracture of unspecified part of neck of left femur, initial encounter for closed fracture: Secondary | ICD-10-CM | POA: Diagnosis not present

## 2023-11-14 DIAGNOSIS — Z681 Body mass index (BMI) 19 or less, adult: Secondary | ICD-10-CM

## 2023-11-14 DIAGNOSIS — E041 Nontoxic single thyroid nodule: Secondary | ICD-10-CM | POA: Insufficient documentation

## 2023-11-14 DIAGNOSIS — K259 Gastric ulcer, unspecified as acute or chronic, without hemorrhage or perforation: Secondary | ICD-10-CM | POA: Insufficient documentation

## 2023-11-14 DIAGNOSIS — Z808 Family history of malignant neoplasm of other organs or systems: Secondary | ICD-10-CM

## 2023-11-14 DIAGNOSIS — E7800 Pure hypercholesterolemia, unspecified: Secondary | ICD-10-CM | POA: Insufficient documentation

## 2023-11-14 DIAGNOSIS — Z9049 Acquired absence of other specified parts of digestive tract: Secondary | ICD-10-CM

## 2023-11-14 DIAGNOSIS — D62 Acute posthemorrhagic anemia: Secondary | ICD-10-CM | POA: Diagnosis not present

## 2023-11-14 DIAGNOSIS — J432 Centrilobular emphysema: Secondary | ICD-10-CM | POA: Diagnosis present

## 2023-11-14 DIAGNOSIS — G4730 Sleep apnea, unspecified: Secondary | ICD-10-CM | POA: Diagnosis present

## 2023-11-14 DIAGNOSIS — R1319 Other dysphagia: Secondary | ICD-10-CM | POA: Insufficient documentation

## 2023-11-14 DIAGNOSIS — K861 Other chronic pancreatitis: Secondary | ICD-10-CM | POA: Diagnosis present

## 2023-11-14 DIAGNOSIS — Z8249 Family history of ischemic heart disease and other diseases of the circulatory system: Secondary | ICD-10-CM

## 2023-11-14 DIAGNOSIS — K8689 Other specified diseases of pancreas: Secondary | ICD-10-CM | POA: Diagnosis present

## 2023-11-14 LAB — CBC WITH DIFFERENTIAL/PLATELET
Abs Immature Granulocytes: 0.03 K/uL (ref 0.00–0.07)
Basophils Absolute: 0 K/uL (ref 0.0–0.1)
Basophils Relative: 0 %
Eosinophils Absolute: 0 K/uL (ref 0.0–0.5)
Eosinophils Relative: 0 %
HCT: 31.1 % — ABNORMAL LOW (ref 36.0–46.0)
Hemoglobin: 10.9 g/dL — ABNORMAL LOW (ref 12.0–15.0)
Immature Granulocytes: 0 %
Lymphocytes Relative: 15 %
Lymphs Abs: 1.5 K/uL (ref 0.7–4.0)
MCH: 35 pg — ABNORMAL HIGH (ref 26.0–34.0)
MCHC: 35 g/dL (ref 30.0–36.0)
MCV: 100 fL (ref 80.0–100.0)
Monocytes Absolute: 0.9 K/uL (ref 0.1–1.0)
Monocytes Relative: 9 %
Neutro Abs: 7.4 K/uL (ref 1.7–7.7)
Neutrophils Relative %: 76 %
Platelets: 195 K/uL (ref 150–400)
RBC: 3.11 MIL/uL — ABNORMAL LOW (ref 3.87–5.11)
RDW: 12.8 % (ref 11.5–15.5)
WBC: 9.8 K/uL (ref 4.0–10.5)
nRBC: 0 % (ref 0.0–0.2)

## 2023-11-14 LAB — BASIC METABOLIC PANEL WITH GFR
Anion gap: 11 (ref 5–15)
BUN: 9 mg/dL (ref 8–23)
CO2: 23 mmol/L (ref 22–32)
Calcium: 7.8 mg/dL — ABNORMAL LOW (ref 8.9–10.3)
Chloride: 96 mmol/L — ABNORMAL LOW (ref 98–111)
Creatinine, Ser: 0.77 mg/dL (ref 0.44–1.00)
GFR, Estimated: >60 mL/min (ref >60)
Glucose, Bld: 94 mg/dL (ref 70–99)
Potassium: 4.5 mmol/L (ref 3.5–5.1)
Sodium: 130 mmol/L — ABNORMAL LOW (ref 135–145)

## 2023-11-14 LAB — CK: Total CK: 73 U/L (ref 38–234)

## 2023-11-14 LAB — SURGICAL PCR SCREEN
MRSA, PCR: NEGATIVE
Staphylococcus aureus: NEGATIVE

## 2023-11-14 LAB — TYPE AND SCREEN
ABO/RH(D): O POS
Antibody Screen: NEGATIVE

## 2023-11-14 LAB — HIV ANTIBODY (ROUTINE TESTING W REFLEX): HIV Screen 4th Generation wRfx: NONREACTIVE

## 2023-11-14 LAB — FOLATE: Folate: 12.3 ng/mL (ref >5.9)

## 2023-11-14 LAB — VITAMIN B12: Vitamin B-12: 203 pg/mL (ref 180–914)

## 2023-11-14 LAB — PROTIME-INR
INR: 1 (ref 0.8–1.2)
Prothrombin Time: 14.2 s (ref 11.4–15.2)

## 2023-11-14 MED ORDER — SODIUM CHLORIDE 1 G PO TABS
2.0000 g | ORAL_TABLET | Freq: Every morning | ORAL | Status: DC
  Administered 2023-11-15 – 2023-11-19 (×5): 2 g via ORAL
  Filled 2023-11-14 (×5): qty 2

## 2023-11-14 MED ORDER — POLYETHYLENE GLYCOL 3350 17 G PO PACK: 17.0000 g | PACK | Freq: Every day | ORAL | Status: DC | PRN

## 2023-11-14 MED ORDER — FAMOTIDINE 20 MG PO TABS
20.0000 mg | ORAL_TABLET | Freq: Every day | ORAL | Status: DC | PRN
  Administered 2023-11-19: 20 mg via ORAL
  Filled 2023-11-14: qty 1

## 2023-11-14 MED ORDER — UMECLIDINIUM BROMIDE 62.5 MCG/ACT IN AEPB
1.0000 | INHALATION_SPRAY | Freq: Every day | RESPIRATORY_TRACT | Status: DC
Start: 2023-11-14 — End: 2023-11-19
  Administered 2023-11-15 – 2023-11-19 (×5): 1 via RESPIRATORY_TRACT
  Filled 2023-11-14: qty 7

## 2023-11-14 MED ORDER — MORPHINE SULFATE (PF) 4 MG/ML IV SOLN
4.0000 mg | INTRAVENOUS | Status: AC | PRN
  Administered 2023-11-14 (×2): 4 mg via INTRAVENOUS
  Filled 2023-11-14 (×2): qty 1

## 2023-11-14 MED ORDER — ARMODAFINIL 200 MG PO TABS: 100.0000 mg | ORAL_TABLET | Freq: Every day | ORAL | Status: DC

## 2023-11-14 MED ORDER — SODIUM CHLORIDE 0.9 % IV SOLN: INTRAVENOUS | Status: AC

## 2023-11-14 MED ORDER — AMLODIPINE BESYLATE 5 MG PO TABS
5.0000 mg | ORAL_TABLET | Freq: Every day | ORAL | Status: DC
  Administered 2023-11-15 – 2023-11-19 (×5): 5 mg via ORAL
  Filled 2023-11-14 (×5): qty 1

## 2023-11-14 MED ORDER — FLUTICASONE FUROATE-VILANTEROL 200-25 MCG/ACT IN AEPB
1.0000 | INHALATION_SPRAY | Freq: Every day | RESPIRATORY_TRACT | Status: DC
  Administered 2023-11-15 – 2023-11-19 (×5): 1 via RESPIRATORY_TRACT
  Filled 2023-11-14: qty 28

## 2023-11-14 MED ORDER — SODIUM CHLORIDE 1 G PO TABS
1.0000 g | ORAL_TABLET | Freq: Three times a day (TID) | ORAL | Status: DC
Start: 2023-11-14 — End: 2023-11-14

## 2023-11-14 MED ORDER — MODAFINIL 100 MG PO TABS
100.0000 mg | ORAL_TABLET | Freq: Every day | ORAL | Status: DC
  Administered 2023-11-15 – 2023-11-19 (×5): 100 mg via ORAL
  Filled 2023-11-14 (×5): qty 1

## 2023-11-14 MED ORDER — VENLAFAXINE HCL ER 37.5 MG PO CP24
37.5000 mg | ORAL_CAPSULE | Freq: Every day | ORAL | Status: DC
  Administered 2023-11-16 – 2023-11-19 (×4): 37.5 mg via ORAL
  Filled 2023-11-14 (×6): qty 1

## 2023-11-14 MED ORDER — ALPRAZOLAM 0.5 MG PO TABS
2.0000 mg | ORAL_TABLET | Freq: Four times a day (QID) | ORAL | Status: DC | PRN
Start: 2023-11-14 — End: 2023-11-18
  Administered 2023-11-14 – 2023-11-16 (×4): 2 mg via ORAL
  Filled 2023-11-14 (×4): qty 4

## 2023-11-14 MED ORDER — MORPHINE SULFATE (PF) 4 MG/ML IV SOLN
4.0000 mg | Freq: Once | INTRAVENOUS | Status: AC
  Administered 2023-11-14: 4 mg via INTRAVENOUS
  Filled 2023-11-14: qty 1

## 2023-11-14 MED ORDER — HYDROMORPHONE HCL 1 MG/ML IJ SOLN
0.5000 mg | INTRAMUSCULAR | Status: DC | PRN
  Administered 2023-11-15 – 2023-11-18 (×8): 0.5 mg via INTRAVENOUS
  Filled 2023-11-14 (×8): qty 0.5

## 2023-11-14 MED ORDER — ALBUTEROL SULFATE (2.5 MG/3ML) 0.083% IN NEBU
2.5000 mg | INHALATION_SOLUTION | Freq: Four times a day (QID) | RESPIRATORY_TRACT | Status: DC | PRN
Start: 2023-11-14 — End: 2023-11-19

## 2023-11-14 MED ORDER — HYDROCODONE-ACETAMINOPHEN 5-325 MG PO TABS
1.0000 | ORAL_TABLET | Freq: Four times a day (QID) | ORAL | Status: DC | PRN
  Administered 2023-11-14 – 2023-11-18 (×8): 1 via ORAL
  Filled 2023-11-14 (×8): qty 1

## 2023-11-14 MED ORDER — PANTOPRAZOLE SODIUM 40 MG PO TBEC
40.0000 mg | DELAYED_RELEASE_TABLET | Freq: Every day | ORAL | Status: DC
Start: 2023-11-15 — End: 2023-11-19
  Administered 2023-11-15 – 2023-11-19 (×5): 40 mg via ORAL
  Filled 2023-11-14 (×5): qty 1

## 2023-11-14 MED ORDER — SODIUM CHLORIDE 1 G PO TABS
1.0000 g | ORAL_TABLET | Freq: Two times a day (BID) | ORAL | Status: DC
  Administered 2023-11-14 – 2023-11-19 (×9): 1 g via ORAL
  Filled 2023-11-14 (×11): qty 1

## 2023-11-14 MED ORDER — ATORVASTATIN CALCIUM 10 MG PO TABS
20.0000 mg | ORAL_TABLET | Freq: Every day | ORAL | Status: DC
  Administered 2023-11-15 – 2023-11-19 (×5): 20 mg via ORAL
  Filled 2023-11-14 (×5): qty 2

## 2023-11-14 MED ORDER — GABAPENTIN 100 MG PO CAPS
100.0000 mg | ORAL_CAPSULE | Freq: Three times a day (TID) | ORAL | Status: DC
  Administered 2023-11-14 – 2023-11-19 (×15): 100 mg via ORAL
  Filled 2023-11-14 (×15): qty 1

## 2023-11-14 NOTE — ED Provider Notes (Signed)
 [place] EMERGENCY DEPARTMENT AT [facility] Provider Note   CSN: [id number] Arrival date & time: 11/14/23  1057     Patient presents with: Gabriella Farrell   Gabriella Farrell is a 68 y.o. female.   Patient is a 68 year old female with a history of hypertension, hyperlipidemia, COPD who presents with pain in her left hip.  She said she was walking down a couple of steps and missed the last step, falling down onto her bottom area.  She denies any head injury.  No neck or back pain other than some chronic neck pain which is unchanged.  She said this happened last night but she did not want to come to the hospital last night.  She was given some pillows and blankets by her husband and laying on the floor during the night.  She was not able to get up this morning so she came to the hospital.  She has ongoing pain in her left hip.  No other complaints of pain.  She is not on anticoagulants.       Prior to Admission medications   Medication Sig Start Date End Date Taking? Authorizing Provider  albuterol  (VENTOLIN  HFA) 108 (90 Base) MCG/ACT inhaler Inhale 2 puffs into the lungs every 6 (six) hours as needed for wheezing or shortness of breath. 04/06/23   Cobb, Comer GAILS, NP  ALPRAZolam  (XANAX ) 0.5 MG tablet Take 2 mg by mouth every 6 (six) hours as needed for anxiety.    [provider]  amLODipine  (NORVASC ) 5 MG tablet Take 1 tablet by mouth once daily 09/09/19   Ladona Heinz, MD  Armodafinil  200 MG TABS Take 100 mg by mouth daily.    [provider]  atorvastatin  (LIPITOR) 20 MG tablet Take 1 tablet by mouth once daily 09/09/19   Ganji, Jay, MD  budesonide -formoterol  (SYMBICORT ) 160-4.5 MCG/ACT inhaler Inhale 2 puffs into the lungs 2 times daily at 12 noon and 4 pm. 05/07/23   Mannam, Praveen, MD  buPROPion (WELLBUTRIN XL) 150 MG 24 hr tablet Take 150 mg by mouth every morning. 10/14/23   [provider]  buPROPion (WELLBUTRIN XL) 300 MG 24 hr tablet Take 300 mg by mouth  every morning. 09/24/23   [provider]  Calcium  Carb-Cholecalciferol (CALCIUM  600 + D PO) Take 1 tablet by mouth daily. With vitamin c    [provider]  cyclobenzaprine  (FLEXERIL ) 10 MG tablet Take 1 tablet (10 mg total) by mouth 3 (three) times daily as needed for muscle spasms. 04/30/23   Shuford, Randine, PA-C  desvenlafaxine (PRISTIQ) 25 MG 24 hr tablet Take 25 mg by mouth daily.    [provider]  famotidine  (PEPCID ) 20 MG tablet Take 20 mg by mouth daily as needed for heartburn or indigestion.    [provider]  gabapentin  (NEURONTIN ) 100 MG capsule Take 100 mg by mouth 3 (three) times daily. 04/11/23   [provider]  ipratropium (ATROVENT) 0.03 % nasal spray Place 2 sprays into both nostrils 2 (two) times daily. 09/09/23   [provider]  Multiple Vitamin (MULTIVITAMIN) capsule Take 1 capsule by mouth daily.    [provider]  naproxen  (NAPROSYN ) 500 MG tablet Take 1 tablet (500 mg total) by mouth 2 (two) times daily with a meal. 04/30/23   Shuford, Randine, PA-C  ondansetron  (ZOFRAN ) 4 MG tablet Take 1 tablet (4 mg total) by mouth every 8 (eight) hours as needed for nausea or vomiting. 04/30/23   Shuford, Randine, PA-C  oxyCODONE -acetaminophen  (PERCOCET) 5-325 MG tablet Take 1 tablet by mouth every 4 (four) hours as needed (max 6 q). 04/30/23   Shuford, Randine, PA-C  pantoprazole  (PROTONIX ) 40 MG tablet Take 40 mg by mouth daily. 04/23/18   [provider]  sodium chloride  1 g tablet Take 1-2 g by mouth See admin instructions. Take 2 g in the morning, 1 g in the afternoon, and 1 g in the evening    [provider]  umeclidinium bromide  (INCRUSE ELLIPTA ) 62.5 MCG/ACT AEPB Inhale 1 puff into the lungs daily. 05/07/23 05/06/24  Mannam, Praveen, MD    Allergies: Bee venom    Review of Systems  Constitutional:  Negative for fever.  Gastrointestinal:  Negative for nausea and vomiting.  Musculoskeletal:  Positive for  arthralgias. Negative for back pain, joint swelling and neck pain.  Skin:  Negative for wound.  Neurological:  Negative for weakness, numbness and headaches.    Updated Vital Signs BP 129/71   Pulse 78   Temp 97.8 F (36.6 C) (Oral)   Resp 13   SpO2 95%   Physical Exam Constitutional:      Appearance: She is well-developed.  HENT:     Head: Normocephalic and atraumatic.  Neck:     Comments: No tenderness along the spine Cardiovascular:     Rate and Rhythm: Normal rate.  Pulmonary:     Effort: Pulmonary effort is normal.  Musculoskeletal:        General: Tenderness present.     Cervical back: Normal range of motion and neck supple.     Comments: Positive external rotation of the left leg.  There is pain with any movement of the left hip.  No pain to the knee or ankle.  Pedal pulses are intact.  There is no pain on range of motion of the other extremities including the right hip.  Skin:    General: Skin is warm and dry.  Neurological:     General: No focal deficit present.     Mental Status: She is alert and oriented to person, place, and time.     (all labs ordered are listed, but only abnormal results are displayed) Labs Reviewed  BASIC METABOLIC PANEL WITH GFR - Abnormal; Notable for the following components:      Result Value   Sodium 130 (*)    Chloride 96 (*)    Calcium  7.8 (*)    All other components within normal limits  CBC WITH DIFFERENTIAL/PLATELET - Abnormal; Notable for the following components:   RBC 3.11 (*)    Hemoglobin 10.9 (*)    HCT 31.1 (*)    MCH 35.0 (*)    All other components within normal limits  PROTIME-INR  CK  HIV ANTIBODY (ROUTINE TESTING W REFLEX)  TYPE AND SCREEN    EKG: EKG Interpretation Date/Time:  Saturday November 14 2023 11:11:23 EDT Ventricular Rate:  82 PR Interval:  129 QRS Duration:  109 QT Interval:  389 QTC Calculation: 455 R Axis:   79  Text Interpretation: Sinus rhythm since last tracing no significant change  Confirmed by Lenor Hollering [phone]) on 11/14/2023 2:16:02 PM  Radiology: [facility]COLA Hip Unilat With Pelvis 2-3 Views Left Result Date: 11/14/2023 CLINICAL DATA:  Status post fall. EXAM: DG HIP (WITH OR WITHOUT PELVIS) 2-3V LEFT COMPARISON:  None Available. FINDINGS: An acute fracture deformity is seen extending through the inter trochanteric region of the proximal left femur. There is no evidence of dislocation. There is no evidence of  significant arthropathy or other focal bone abnormality. IMPRESSION: Acute intertrochanteric fracture of the proximal left femur. Electronically Signed   By: Suzen Dials M.D.   On: 11/14/2023 11:40     Procedures   Medications Ordered in the ED  0.9 %  sodium chloride  infusion ( Intravenous New Bag/Given 11/14/23 1234)  morphine (PF) 4 MG/ML injection 4 mg (has no administration in time range)  amLODipine  (NORVASC ) tablet 5 mg (has no administration in time range)  atorvastatin  (LIPITOR) tablet 20 mg (has no administration in time range)  venlafaxine  XR (EFFEXOR -XR) 24 hr capsule 37.5 mg (has no administration in time range)  pantoprazole  (PROTONIX ) EC tablet 40 mg (has no administration in time range)  gabapentin  (NEURONTIN ) capsule 100 mg (has no administration in time range)  sodium chloride  tablet 1-2 g (has no administration in time range)  albuterol  (PROVENTIL ) (2.5 MG/3ML) 0.083% nebulizer solution 2.5 mg (has no administration in time range)  HYDROcodone-acetaminophen  (NORCO/VICODIN) 5-325 MG per tablet 1 tablet (has no administration in time range)  HYDROmorphone  (DILAUDID ) injection 0.5 mg (has no administration in time range)  polyethylene glycol (MIRALAX / GLYCOLAX) packet 17 g (has no administration in time range)  morphine (PF) 4 MG/ML injection 4 mg (4 mg Intravenous Given 11/14/23 1233)                                    Medical Decision Making Amount and/or Complexity of Data Reviewed Labs: ordered. Radiology:  ordered.  Risk Prescription drug management. Decision regarding hospitalization.   This patient presents to the ED for concern of left hip pain, this involves an extensive number of treatment options, and is a complaint that carries with it a high risk of complications and morbidity.  I considered the following differential and admission for this acute, potentially life threatening condition.  The differential diagnosis includes fracture, contusion, rhabdomyolysis  MDM:    Patient is a 68 year old who presents after mechanical fall.  She has pain in her left hip.  X-rays were done which show an intertrochanteric fracture.  She denies any other injuries.  She adamantly denies any head injury.  She is not on anticoagulants.  Labs reviewed.  She has some mild anemia and some mild hyponatremia.  Discussed with Dr. Melvin who admit the patient for further treatment.  I also discussed with the on-call orthopedic surgeon, Dr. Reyne who will see the patient.  (Labs, imaging, consults)  Labs: I Ordered, and personally interpreted labs.  The pertinent results include: Mild hyponatremia, mild anemia  Imaging Studies ordered: I ordered imaging studies including left hip x-ray I independently visualized and interpreted imaging. I agree with the radiologist interpretation  Additional history obtained from husband at bedside.  External records from outside source obtained and reviewed including history  Cardiac Monitoring: The patient was maintained on a cardiac monitor.  If on the cardiac monitor, I personally viewed and interpreted the cardiac monitored which showed an underlying rhythm of: Sinus rhythm  Reevaluation: After the interventions noted above, I reevaluated the patient and found that they have :improved  Social Determinants of Health:  smoker  Disposition: Admitted to hospital  Co morbidities that complicate the patient evaluation  Past Medical History:  Diagnosis Date    Anxiety    Arthritis    Cancer (HCC)    lesion on right side of neck   Chronic kidney disease    COPD (chronic obstructive pulmonary  disease) (HCC)    Depression    Discoid lupus erythematosus    Esophageal dysphagia 11/14/2023   GERD (gastroesophageal reflux disease)    H/O chronic pancreatitis    H/O ETOH abuse    Hypertension    Multiple pulmonary nodules    Stable per Pulmonology   SIADH (syndrome of inappropriate ADH production)    Sleep apnea    no CPAP   Smoker      Medicines Meds ordered this encounter  Medications   morphine (PF) 4 MG/ML injection 4 mg   0.9 %  sodium chloride  infusion   morphine (PF) 4 MG/ML injection 4 mg   amLODipine  (NORVASC ) tablet 5 mg   atorvastatin  (LIPITOR) tablet 20 mg   venlafaxine  XR (EFFEXOR -XR) 24 hr capsule 37.5 mg   pantoprazole  (PROTONIX ) EC tablet 40 mg   gabapentin  (NEURONTIN ) capsule 100 mg   sodium chloride  tablet 1-2 g    Take 2 g in the morning, 1 g in the afternoon, and 1 g in the evening     albuterol  (PROVENTIL ) (2.5 MG/3ML) 0.083% nebulizer solution 2.5 mg   HYDROcodone-acetaminophen  (NORCO/VICODIN) 5-325 MG per tablet 1 tablet    Refill:  0   HYDROmorphone  (DILAUDID ) injection 0.5 mg   polyethylene glycol (MIRALAX / GLYCOLAX) packet 17 g    I have reviewed the patients home medicines and have made adjustments as needed  Problem List / ED Course: Problem List Items Addressed This Visit   None Visit Diagnoses       Closed left hip fracture, initial encounter (HCC)    -  Primary                Final diagnoses:  Closed left hip fracture, initial encounter [facility])    ED Discharge Orders     None          Lenor Hollering, MD 11/14/23 1418

## 2023-11-14 NOTE — ED Triage Notes (Signed)
 Pt BIB [facility] from home. Pt had a mechanical fall. Pt missed the last step on the stair and landed on her L side. Denied hitting her ehad or LOC. Pt stayed on the floor by choice thinking it would get better. Pt's husband called ems this morning bc the pt was not able to get up the floor. Pt's L leg looks shortened and rotated outward. Pulse is intact. Pt was on the floor for approximately 10hrs. A&O X4. VSS.

## 2023-11-14 NOTE — H&P (View-Only) (Signed)
 Orthopedic Consult  Patient ID: Gabriella Farrell MRN: [id number] DOB/AGE: 1955/06/04 68 y.o.  Reason for Consult: Left hip pain Referring Physician: Dr. Seena  HPI: Gabriella Farrell is an 68 y.o. female who fell earlier this morning.  She was walking on the steps and misjudged the step landed awkwardly on her left side.  She complains of pain in the left hip.  She denies any other injuries over the fall.  She does have a history of a prior left shoulder fracture several years ago.  This initially through the plate and screws but then 3 months ago just converted to a arthroplasty.  She denies any loss of consciousness.  Denies any antecedent left hip pain  Past Medical History:  Diagnosis Date   Anxiety    Arthritis    Cancer (HCC)    lesion on right side of neck   Chronic kidney disease    COPD (chronic obstructive pulmonary disease) (HCC)    Depression    Discoid lupus erythematosus    Esophageal dysphagia 11/14/2023   GERD (gastroesophageal reflux disease)    H/O chronic pancreatitis    H/O ETOH abuse    Hypertension    Multiple pulmonary nodules    Stable per Pulmonology   SIADH (syndrome of inappropriate ADH production)    Sleep apnea    no CPAP   Smoker     Past Surgical History:  Procedure Laterality Date   BREAST EXCISIONAL BIOPSY Right    CHOLECYSTECTOMY  2010   open cholecystectomy and also repaired ducts in pancreas   COLONOSCOPY W/ BIOPSIES AND POLYPECTOMY     x 5   ESOPHAGOGASTRODUODENOSCOPY     x 3   EYE SURGERY Bilateral 2020   cataract extractons   HARDWARE REMOVAL Left 04/30/2023   Procedure: REMOVAL, HARDWARE;  Surgeon: Melita Drivers, MD;  Location: [facility];  Service: Orthopedics;  Laterality: Left;   REVERSE SHOULDER ARTHROPLASTY Left 04/30/2023   Procedure: ARTHROPLASTY, SHOULDER, TOTAL, REVERSE;  Surgeon: Melita Drivers, MD;  Location: [facility];  Service: Orthopedics;  Laterality: Left;   SHOULDER SURGERY Left 2016   for Fx Humerus and dislocation    WISDOM TOOTH EXTRACTION      Family History  Problem Relation Age of Onset   Cancer Mother    Heart attack Mother 38   Heart disease Sister    Cancer - Lung Brother    Melanoma Sister     Social History:  reports that she has been smoking cigarettes. She started smoking about 49 years ago. She has a 49.8 pack-year smoking history. She has never used smokeless tobacco. She reports current alcohol use of about 15.0 standard drinks of alcohol per week. She reports that she does not use drugs.  Allergies:  Allergies  Allergen Reactions   Bee Venom Anaphylaxis    Medications: I have reviewed the patient's current medications.  ROS: Constitutional: No fever or chills Vision: No changes in vision ENT: No difficulty swallowing CV: No chest pain Pulm: No SOB or wheezing GI: No nausea or vomiting GU: No urgency or inability to hold urine Skin: No poor wound healing Neurologic: No numbness or tingling Psychiatric: No depression or anxiety Heme: No bruising Allergic: No reaction to medications or food   Exam: Blood pressure (!) 145/81, pulse 92, temperature 97.8 F (36.6 C), resp. rate 17, SpO2 93%. General: Well-appearing woman in no acute distress Orientation: Alert and oriented Mood and Affect: Mood is calm   Injured Extremity (CV, lymph, sensation, reflexes): Left  leg is shortened and externally rotated.  She has no tense palpation of the left knee or left ankle.  She has intact sensation in the saphenous, sural, tibial, and peroneal nerve distributions.  She has 5 out of 5 strength in the EHL, FHL, gastrocs, and tibialis anterior.  Brisk capillary refill.  Examination of the bilateral upper extremities and right lower extremity Veals no tenderness palpation no crepitus defects deformities     Medical Decision Making: Data: Imaging: AP pelvis and views of the left hip reveal a left intertrochanteric hip fracture  Labs: White cell count 9.8, hematocrit 31.1, hemoglobin  10.9, platelets 195, INR is 1.0  Imaging or Labs ordered: None  Medical history and chart was reviewed and case discussed with medical provider.  Assessment/Plan: Left intra-trochanteric hip fracture  The patient does have a left trochanteric hip fracture.  This require surgical stabilization.  This will be a closed reduction and intramedullary fixation.  We discussed the goals of surgery provide for stabilization of fracture and allow for early mobilization.  We discussed the risks of surgery which include but are not limited to bleeding, infection, injury to nerves or tendons, malunion, nonunion, hardware failure, risk of blood clots, the risks of anesthesia.  Informed signs obtained.  Will make the patient n.p.o. at midnight and plan for surgery tomorrow morning.  We have discussed the expected rehabilitation and recovery period.  Informed consent was obtained.  All questions were answered and discussed.  New problem w/ workup planned: High complexity diagnosis (Level 5) Surgery w/ risks or Emergency surgery: High complexity Risk (Level 5)  All others are Level 4 with comprehensive musculoskeletal exam.  Cordella Rhein, MD, MS [facility] [phone]

## 2023-11-14 NOTE — Consult Note (Signed)
 Orthopedic Consult  Patient ID: Gabriella Farrell MRN: [id number] DOB/AGE: 1955/06/04 68 y.o.  Reason for Consult: Left hip pain Referring Physician: Dr. Seena  HPI: Gabriella Farrell is an 68 y.o. female who fell earlier this morning.  She was walking on the steps and misjudged the step landed awkwardly on her left side.  She complains of pain in the left hip.  She denies any other injuries over the fall.  She does have a history of a prior left shoulder fracture several years ago.  This initially through the plate and screws but then 3 months ago just converted to a arthroplasty.  She denies any loss of consciousness.  Denies any antecedent left hip pain  Past Medical History:  Diagnosis Date   Anxiety    Arthritis    Cancer (HCC)    lesion on right side of neck   Chronic kidney disease    COPD (chronic obstructive pulmonary disease) (HCC)    Depression    Discoid lupus erythematosus    Esophageal dysphagia 11/14/2023   GERD (gastroesophageal reflux disease)    H/O chronic pancreatitis    H/O ETOH abuse    Hypertension    Multiple pulmonary nodules    Stable per Pulmonology   SIADH (syndrome of inappropriate ADH production)    Sleep apnea    no CPAP   Smoker     Past Surgical History:  Procedure Laterality Date   BREAST EXCISIONAL BIOPSY Right    CHOLECYSTECTOMY  2010   open cholecystectomy and also repaired ducts in pancreas   COLONOSCOPY W/ BIOPSIES AND POLYPECTOMY     x 5   ESOPHAGOGASTRODUODENOSCOPY     x 3   EYE SURGERY Bilateral 2020   cataract extractons   HARDWARE REMOVAL Left 04/30/2023   Procedure: REMOVAL, HARDWARE;  Surgeon: Melita Drivers, MD;  Location: [facility];  Service: Orthopedics;  Laterality: Left;   REVERSE SHOULDER ARTHROPLASTY Left 04/30/2023   Procedure: ARTHROPLASTY, SHOULDER, TOTAL, REVERSE;  Surgeon: Melita Drivers, MD;  Location: [facility];  Service: Orthopedics;  Laterality: Left;   SHOULDER SURGERY Left 2016   for Fx Humerus and dislocation    WISDOM TOOTH EXTRACTION      Family History  Problem Relation Age of Onset   Cancer Mother    Heart attack Mother 38   Heart disease Sister    Cancer - Lung Brother    Melanoma Sister     Social History:  reports that she has been smoking cigarettes. She started smoking about 49 years ago. She has a 49.8 pack-year smoking history. She has never used smokeless tobacco. She reports current alcohol use of about 15.0 standard drinks of alcohol per week. She reports that she does not use drugs.  Allergies:  Allergies  Allergen Reactions   Bee Venom Anaphylaxis    Medications: I have reviewed the patient's current medications.  ROS: Constitutional: No fever or chills Vision: No changes in vision ENT: No difficulty swallowing CV: No chest pain Pulm: No SOB or wheezing GI: No nausea or vomiting GU: No urgency or inability to hold urine Skin: No poor wound healing Neurologic: No numbness or tingling Psychiatric: No depression or anxiety Heme: No bruising Allergic: No reaction to medications or food   Exam: Blood pressure (!) 145/81, pulse 92, temperature 97.8 F (36.6 C), resp. rate 17, SpO2 93%. General: Well-appearing woman in no acute distress Orientation: Alert and oriented Mood and Affect: Mood is calm   Injured Extremity (CV, lymph, sensation, reflexes): Left  leg is shortened and externally rotated.  She has no tense palpation of the left knee or left ankle.  She has intact sensation in the saphenous, sural, tibial, and peroneal nerve distributions.  She has 5 out of 5 strength in the EHL, FHL, gastrocs, and tibialis anterior.  Brisk capillary refill.  Examination of the bilateral upper extremities and right lower extremity Veals no tenderness palpation no crepitus defects deformities     Medical Decision Making: Data: Imaging: AP pelvis and views of the left hip reveal a left intertrochanteric hip fracture  Labs: White cell count 9.8, hematocrit 31.1, hemoglobin  10.9, platelets 195, INR is 1.0  Imaging or Labs ordered: None  Medical history and chart was reviewed and case discussed with medical provider.  Assessment/Plan: Left intra-trochanteric hip fracture  The patient does have a left trochanteric hip fracture.  This require surgical stabilization.  This will be a closed reduction and intramedullary fixation.  We discussed the goals of surgery provide for stabilization of fracture and allow for early mobilization.  We discussed the risks of surgery which include but are not limited to bleeding, infection, injury to nerves or tendons, malunion, nonunion, hardware failure, risk of blood clots, the risks of anesthesia.  Informed signs obtained.  Will make the patient n.p.o. at midnight and plan for surgery tomorrow morning.  We have discussed the expected rehabilitation and recovery period.  Informed consent was obtained.  All questions were answered and discussed.  New problem w/ workup planned: High complexity diagnosis (Level 5) Surgery w/ risks or Emergency surgery: High complexity Risk (Level 5)  All others are Level 4 with comprehensive musculoskeletal exam.  Cordella Rhein, MD, MS [facility] [phone]

## 2023-11-14 NOTE — H&P (Signed)
 History and Physical   Gabriella Farrell FMW:[id number] DOB: 01-28-1956 DOA: 11/14/2023  PCP: Katina Pfeiffer, PA-C   Patient coming from: Home  Chief Complaint: Fall, hip pain  HPI: Gabriella Farrell is a 68 y.o. female with medical history significant of hypertension, hyperlipidemia, GERD, lupus, depression, anxiety, alcohol use, emphysema, chronic pancreatitis with pancreatic insufficiency, fatty liver disease presenting after fall at home.  Patient was walking down some stairs last night she missed the last step and fell landing on her left side.  He had some pain and decided to stay on the ground overnight because she did not want to go to the ED and hoped it would improve.  Husband brought her blankets and a pillow.  This morning she was still in pain and unable to get up and husband called EMS.  Patient did not hit her head, denies any loss of consciousness.  Not on any blood thinners.  Further denies fevers, chills, chest pain, shortness of breath, abdominal pain, constipation, diarrhea, nausea, vomiting.  ED Course: Vital signs in the ED stable.  Lab workup included BMP with sodium stable at 130, chloride 96, calcium  7.8.  CBC with hemoglobin of 10.9 which is somewhat down from previous of 12 with elevated MCV.  PT and INR stable.  CK normal.  Type and screen performed in the ED.  Left hip x-ray showed acute intricate Tarik fracture of the left proximal femur.  Patient received morphine and IV fluids in the ED.  Orthopedic surgery consulted and plan for surgery tomorrow.  Review of Systems: As per HPI otherwise all other systems reviewed and are negative.  Past Medical History:  Diagnosis Date   Anxiety    Arthritis    Cancer (HCC)    lesion on right side of neck   Chronic kidney disease    COPD (chronic obstructive pulmonary disease) (HCC)    Depression    Discoid lupus erythematosus    Esophageal dysphagia 11/14/2023   GERD (gastroesophageal reflux disease)    H/O chronic  pancreatitis    H/O ETOH abuse    Hypertension    Multiple pulmonary nodules    Stable per Pulmonology   SIADH (syndrome of inappropriate ADH production)    Sleep apnea    no CPAP   Smoker     Past Surgical History:  Procedure Laterality Date   BREAST EXCISIONAL BIOPSY Right    CHOLECYSTECTOMY  2010   open cholecystectomy and also repaired ducts in pancreas   COLONOSCOPY W/ BIOPSIES AND POLYPECTOMY     x 5   ESOPHAGOGASTRODUODENOSCOPY     x 3   EYE SURGERY Bilateral 2020   cataract extractons   HARDWARE REMOVAL Left 04/30/2023   Procedure: REMOVAL, HARDWARE;  Surgeon: Melita Drivers, MD;  Location: [facility];  Service: Orthopedics;  Laterality: Left;   REVERSE SHOULDER ARTHROPLASTY Left 04/30/2023   Procedure: ARTHROPLASTY, SHOULDER, TOTAL, REVERSE;  Surgeon: Melita Drivers, MD;  Location: [facility];  Service: Orthopedics;  Laterality: Left;   SHOULDER SURGERY Left 2016   for Fx Humerus and dislocation   WISDOM TOOTH EXTRACTION      Social History  reports that she has been smoking cigarettes. She started smoking about 49 years ago. She has a 49.8 pack-year smoking history. She has never used smokeless tobacco. She reports current alcohol use of about 15.0 standard drinks of alcohol per week. She reports that she does not use drugs.  Allergies  Allergen Reactions   Bee Venom Anaphylaxis    Family  History  Problem Relation Age of Onset   Cancer Mother    Heart attack Mother 93   Heart disease Sister    Cancer - Lung Brother    Melanoma Sister   Reviewed on admission  Prior to Admission medications   Medication Sig Start Date End Date Taking? Authorizing Provider  albuterol  (VENTOLIN  HFA) 108 (90 Base) MCG/ACT inhaler Inhale 2 puffs into the lungs every 6 (six) hours as needed for wheezing or shortness of breath. 04/06/23   Cobb, Comer GAILS, NP  ALPRAZolam  (XANAX ) 0.5 MG tablet Take 2 mg by mouth every 6 (six) hours as needed for anxiety.    [provider]  amLODipine   (NORVASC ) 5 MG tablet Take 1 tablet by mouth once daily 09/09/19   Ganji, Jay, MD  Armodafinil  200 MG TABS Take 100 mg by mouth daily.    [provider]  atorvastatin  (LIPITOR) 20 MG tablet Take 1 tablet by mouth once daily 09/09/19   Ladona Heinz, MD  budesonide -formoterol  (SYMBICORT ) 160-4.5 MCG/ACT inhaler Inhale 2 puffs into the lungs 2 times daily at 12 noon and 4 pm. 05/07/23   Mannam, Praveen, MD  buPROPion (WELLBUTRIN XL) 150 MG 24 hr tablet Take 150 mg by mouth every morning. 10/14/23   [provider]  buPROPion (WELLBUTRIN XL) 300 MG 24 hr tablet Take 300 mg by mouth every morning. 09/24/23   [provider]  Calcium  Carb-Cholecalciferol (CALCIUM  600 + D PO) Take 1 tablet by mouth daily. With vitamin c    [provider]  cyclobenzaprine  (FLEXERIL ) 10 MG tablet Take 1 tablet (10 mg total) by mouth 3 (three) times daily as needed for muscle spasms. 04/30/23   Shuford, Randine, PA-C  desvenlafaxine (PRISTIQ) 25 MG 24 hr tablet Take 25 mg by mouth daily.    [provider]  famotidine  (PEPCID ) 20 MG tablet Take 20 mg by mouth daily as needed for heartburn or indigestion.    [provider]  gabapentin  (NEURONTIN ) 100 MG capsule Take 100 mg by mouth 3 (three) times daily. 04/11/23   [provider]  ipratropium (ATROVENT) 0.03 % nasal spray Place 2 sprays into both nostrils 2 (two) times daily. 09/09/23   [provider]  Multiple Vitamin (MULTIVITAMIN) capsule Take 1 capsule by mouth daily.    [provider]  naproxen  (NAPROSYN ) 500 MG tablet Take 1 tablet (500 mg total) by mouth 2 (two) times daily with a meal. 04/30/23   Shuford, Randine, PA-C  ondansetron  (ZOFRAN ) 4 MG tablet Take 1 tablet (4 mg total) by mouth every 8 (eight) hours as needed for nausea or vomiting. 04/30/23   Shuford, Randine, PA-C  oxyCODONE -acetaminophen  (PERCOCET) 5-325 MG tablet Take 1 tablet by mouth every 4 (four) hours as needed (max 6 q). 04/30/23    Shuford, Randine, PA-C  pantoprazole  (PROTONIX ) 40 MG tablet Take 40 mg by mouth daily. 04/23/18   [provider]  sodium chloride  1 g tablet Take 1-2 g by mouth See admin instructions. Take 2 g in the morning, 1 g in the afternoon, and 1 g in the evening    [provider]  umeclidinium bromide  (INCRUSE ELLIPTA ) 62.5 MCG/ACT AEPB Inhale 1 puff into the lungs daily. 05/07/23 05/06/24  Mannam, Praveen, MD    Physical Exam: Vitals:   11/14/23 1200 11/14/23 1215 11/14/23 1330 11/14/23 1345  BP: 129/67 125/72 129/74 129/71  Pulse: 79 82 80 78  Resp: 16 17 14 13   Temp:  TempSrc:      SpO2: 95% 97% 93% 95%    Physical Exam Constitutional:      General: She is not in acute distress.    Appearance: Normal appearance.  HENT:     Head: Normocephalic and atraumatic.     Mouth/Throat:     Mouth: Mucous membranes are moist.     Pharynx: Oropharynx is clear.  Eyes:     Extraocular Movements: Extraocular movements intact.     Pupils: Pupils are equal, round, and reactive to light.  Cardiovascular:     Rate and Rhythm: Normal rate and regular rhythm.     Pulses: Normal pulses.     Heart sounds: Normal heart sounds.  Pulmonary:     Effort: Pulmonary effort is normal. No respiratory distress.     Breath sounds: Normal breath sounds.  Abdominal:     General: Bowel sounds are normal. There is no distension.     Palpations: Abdomen is soft.     Tenderness: There is no abdominal tenderness.  Musculoskeletal:        General: No swelling or deformity.     Comments: Bilateral lower extremities neurovascularly intact.  Left lower extremity foreshortened and rotated.  Skin:    General: Skin is warm and dry.  Neurological:     General: No focal deficit present.     Mental Status: Mental status is at baseline.     Labs on Admission: I have personally reviewed following labs and imaging studies  CBC: Recent Labs  Lab 11/14/23 1218  WBC 9.8  NEUTROABS 7.4  HGB 10.9*  HCT  31.1*  MCV 100.0  PLT 195    Basic Metabolic Panel: Recent Labs  Lab 11/14/23 1218  NA 130*  K 4.5  CL 96*  CO2 23  GLUCOSE 94  BUN 9  CREATININE 0.77  CALCIUM  7.8*    GFR: CrCl cannot be calculated (Unknown ideal weight.).  Liver Function Tests: No results for input(s): AST, ALT, ALKPHOS, BILITOT, PROT, ALBUMIN in the last 168 hours.  Urine analysis:    Component Value Date/Time   COLORURINE YELLOW 10/10/2009 1927   APPEARANCEUR CLEAR 10/10/2009 1927   LABSPEC 1.003 (L) 10/10/2009 1927   PHURINE 6.5 10/10/2009 1927   GLUCOSEU NEGATIVE 10/10/2009 1927   HGBUR NEGATIVE 10/10/2009 1927   BILIRUBINUR NEGATIVE 10/10/2009 1927   KETONESUR NEGATIVE 10/10/2009 1927   PROTEINUR NEGATIVE 10/10/2009 1927   UROBILINOGEN 0.2 10/10/2009 1927   NITRITE NEGATIVE 10/10/2009 1927   LEUKOCYTESUR TRACE (A) 10/10/2009 1927    Radiological Exams on Admission: DG Hip Unilat With Pelvis 2-3 Views Left Result Date: 11/14/2023 CLINICAL DATA:  Status post fall. EXAM: DG HIP (WITH OR WITHOUT PELVIS) 2-3V LEFT COMPARISON:  None Available. FINDINGS: An acute fracture deformity is seen extending through the inter trochanteric region of the proximal left femur. There is no evidence of dislocation. There is no evidence of significant arthropathy or other focal bone abnormality. IMPRESSION: Acute intertrochanteric fracture of the proximal left femur. Electronically Signed   By: Suzen Dials M.D.   On: 11/14/2023 11:40   EKG: Independently reviewed.  Sinus rhythm at 80 bpm.  Assessment/Plan Active Problems:   Anxiety   Depression   Centrilobular emphysema (HCC)   Alcohol abuse   Chronic pancreatitis (HCC)   Essential hypertension   Gastroesophageal reflux disease   Lupus erythematosus   Pancreatic insufficiency   Fall Femur fracture > Patient presenting after fall down some stairs at home landing on her left  side.  Seen in the ground overnight hoping it would improve and  but pain persisted this morning and EMS was called. > Imaging showed acute intra Karrick fracture of the left proximal femur.  Orthopedic consulted and plan for surgery tomorrow. > Patient received morphine and IV fluids in the ED. - Monitor on MedSurg with continuous pulse ox - Appreciate orthopedic surgery recommendations and assistance - As needed pain medication - Consult to anesthesia for nerve block - Hip fracture protocol - Supportive care - N.p.o. at midnight  Anemia > Microcytic with elevated MCV here and on recent labs.  Hemoglobin now 10.9 which is down from around 12 at baseline. - Trend CBC - Check B12 and folate  Hypertension - Continue amlodipine   Hyperlipidemia - Continue atorvastatin   Depression Anxiety - Replace home desvenlafaxine with formulary venlafaxine  - Continue home armodafinil  - As needed Xanax   Lupus - Not currently taking hydroxychloroquine and prednisone per med rec  Alcohol use > History of pancreatitis related to this - Noted  COPD/emphysema - Continue home Incruse - Replace home Symbicort  formulary Breo - As needed butyryl  Chronic Pancreatitis Pancreatic insufficiency - Not currently on medication for this  DVT prophylaxis: SCDs Code Status:   Full Family Communication:  Updated at bedside Disposition Plan:   Patient is from:  Home  Anticipated DC to:  Home  Anticipated DC date:  2 to 3 days  Anticipated DC barriers: None  Consults called:  Orthopedic surgery Admission status:  Inpatient, MedSurg  Severity of Illness: The appropriate patient status for this patient is INPATIENT. Inpatient status is judged to be reasonable and necessary in order to provide the required intensity of service to ensure the patient's safety. The patient's presenting symptoms, physical exam findings, and initial radiographic and laboratory data in the context of their chronic comorbidities is felt to place them at high risk for further clinical  deterioration. Furthermore, it is not anticipated that the patient will be medically stable for discharge from the hospital within 2 midnights of admission.   * I certify that at the point of admission it is my clinical judgment that the patient will require inpatient hospital care spanning beyond 2 midnights from the point of admission due to high intensity of service, high risk for further deterioration and high frequency of surveillance required.DEWAINE Marsa KATHEE Seena MD [facility]  How to contact the [facility] Attending or Consulting provider 7A - 7P or covering provider during after hours 7P -7A, for this patient?   Check the care team in [facility] and look for a) attending/consulting [facility] provider listed and b) the [facility] team listed Log into [email or web address] and use 's universal password to access. If you do not have the password, please contact the hospital operator. Locate the [facility] provider you are looking for under [facility] and page to a number that you can be directly reached. If you still have difficulty reaching the provider, please page the [facility] (Director on Call) for the Hospitalists listed on amion for assistance.  11/14/2023, 2:09 PM

## 2023-11-15 ENCOUNTER — Other Ambulatory Visit: Payer: Self-pay

## 2023-11-15 ENCOUNTER — Encounter (HOSPITAL_COMMUNITY): Payer: Self-pay | Admitting: Internal Medicine

## 2023-11-15 ENCOUNTER — Inpatient Hospital Stay (HOSPITAL_COMMUNITY): Admitting: Registered Nurse

## 2023-11-15 ENCOUNTER — Inpatient Hospital Stay (HOSPITAL_COMMUNITY)

## 2023-11-15 ENCOUNTER — Encounter (HOSPITAL_COMMUNITY)
Admission: EM | Disposition: A | Discharge status: Home Health | Payer: Self-pay | Source: Home / Self Care | Attending: Internal Medicine

## 2023-11-15 DIAGNOSIS — S72002A Fracture of unspecified part of neck of left femur, initial encounter for closed fracture: Secondary | ICD-10-CM | POA: Diagnosis not present

## 2023-11-15 DIAGNOSIS — J432 Centrilobular emphysema: Secondary | ICD-10-CM | POA: Diagnosis not present

## 2023-11-15 DIAGNOSIS — I10 Essential (primary) hypertension: Secondary | ICD-10-CM | POA: Diagnosis not present

## 2023-11-15 DIAGNOSIS — F17210 Nicotine dependence, cigarettes, uncomplicated: Secondary | ICD-10-CM

## 2023-11-15 DIAGNOSIS — K8689 Other specified diseases of pancreas: Secondary | ICD-10-CM

## 2023-11-15 DIAGNOSIS — S72142A Displaced intertrochanteric fracture of left femur, initial encounter for closed fracture: Secondary | ICD-10-CM | POA: Diagnosis not present

## 2023-11-15 DIAGNOSIS — D539 Nutritional anemia, unspecified: Secondary | ICD-10-CM | POA: Diagnosis not present

## 2023-11-15 DIAGNOSIS — F1010 Alcohol abuse, uncomplicated: Secondary | ICD-10-CM | POA: Diagnosis not present

## 2023-11-15 DIAGNOSIS — K860 Alcohol-induced chronic pancreatitis: Secondary | ICD-10-CM

## 2023-11-15 DIAGNOSIS — L930 Discoid lupus erythematosus: Secondary | ICD-10-CM

## 2023-11-15 DIAGNOSIS — J449 Chronic obstructive pulmonary disease, unspecified: Secondary | ICD-10-CM

## 2023-11-15 HISTORY — PX: INTRAMEDULLARY (IM) NAIL INTERTROCHANTERIC: SHX5875

## 2023-11-15 LAB — FERRITIN: Ferritin: 207 ng/mL (ref 11–307)

## 2023-11-15 LAB — CBC
HCT: 27.2 % — ABNORMAL LOW (ref 36.0–46.0)
Hemoglobin: 9.3 g/dL — ABNORMAL LOW (ref 12.0–15.0)
MCH: 35 pg — ABNORMAL HIGH (ref 26.0–34.0)
MCHC: 34.2 g/dL (ref 30.0–36.0)
MCV: 102.3 fL — ABNORMAL HIGH (ref 80.0–100.0)
Platelets: 154 K/uL (ref 150–400)
RBC: 2.66 MIL/uL — ABNORMAL LOW (ref 3.87–5.11)
RDW: 13 % (ref 11.5–15.5)
WBC: 7.1 K/uL (ref 4.0–10.5)
nRBC: 0 % (ref 0.0–0.2)

## 2023-11-15 LAB — IRON AND TIBC
Iron: 61 ug/dL (ref 28–170)
Saturation Ratios: 26 % (ref 10.4–31.8)
TIBC: 234 ug/dL — ABNORMAL LOW (ref 250–450)
UIBC: 173 ug/dL

## 2023-11-15 LAB — BASIC METABOLIC PANEL WITH GFR
Anion gap: 7 (ref 5–15)
BUN: 6 mg/dL — ABNORMAL LOW (ref 8–23)
CO2: 24 mmol/L (ref 22–32)
Calcium: 7.4 mg/dL — ABNORMAL LOW (ref 8.9–10.3)
Chloride: 105 mmol/L (ref 98–111)
Creatinine, Ser: 0.76 mg/dL (ref 0.44–1.00)
GFR, Estimated: >60 mL/min (ref >60)
Glucose, Bld: 108 mg/dL — ABNORMAL HIGH (ref 70–99)
Potassium: 4.1 mmol/L (ref 3.5–5.1)
Sodium: 136 mmol/L (ref 135–145)

## 2023-11-15 LAB — ALBUMIN: Albumin: 2.9 g/dL — ABNORMAL LOW (ref 3.5–5.0)

## 2023-11-15 SURGERY — FIXATION, FRACTURE, INTERTROCHANTERIC, WITH INTRAMEDULLARY ROD
Anesthesia: General | Laterality: Left

## 2023-11-15 MED ORDER — ALBUMIN HUMAN 5 % IV SOLN: INTRAVENOUS | Status: DC | PRN

## 2023-11-15 MED ORDER — DOCUSATE SODIUM 100 MG PO CAPS
100.0000 mg | ORAL_CAPSULE | Freq: Two times a day (BID) | ORAL | Status: DC
Start: 2023-11-15 — End: 2023-11-19
  Administered 2023-11-15 – 2023-11-19 (×9): 100 mg via ORAL
  Filled 2023-11-15 (×9): qty 1

## 2023-11-15 MED ORDER — HYDROMORPHONE HCL 1 MG/ML IJ SOLN: 0.2500 mg | INTRAMUSCULAR | Status: DC | PRN

## 2023-11-15 MED ORDER — DEXAMETHASONE SOD PHOSPHATE PF 10 MG/ML IJ SOLN
INTRAMUSCULAR | Status: DC | PRN
  Administered 2023-11-15: 10 mg via INTRAVENOUS

## 2023-11-15 MED ORDER — PHENYLEPHRINE 80 MCG/ML (10ML) SYRINGE FOR IV PUSH (FOR BLOOD PRESSURE SUPPORT)
PREFILLED_SYRINGE | INTRAVENOUS | Status: DC | PRN
  Administered 2023-11-15: 160 ug via INTRAVENOUS

## 2023-11-15 MED ORDER — MIDAZOLAM HCL 2 MG/2ML IJ SOLN: 0.5000 mg | Freq: Once | INTRAMUSCULAR | Status: DC | PRN

## 2023-11-15 MED ORDER — PROPOFOL 10 MG/ML IV BOLUS
INTRAVENOUS | Status: DC | PRN
  Administered 2023-11-15: 120 mg via INTRAVENOUS

## 2023-11-15 MED ORDER — LIDOCAINE 2% (20 MG/ML) 5 ML SYRINGE
INTRAMUSCULAR | Status: AC
  Filled 2023-11-15: qty 5

## 2023-11-15 MED ORDER — SUGAMMADEX SODIUM 200 MG/2ML IV SOLN
INTRAVENOUS | Status: DC | PRN
  Administered 2023-11-15: 200 mg via INTRAVENOUS

## 2023-11-15 MED ORDER — CALCIUM GLUCONATE-NACL 1-0.675 GM/50ML-% IV SOLN
1.0000 g | Freq: Once | INTRAVENOUS | Status: AC
  Administered 2023-11-15: 1000 mg via INTRAVENOUS
  Filled 2023-11-15: qty 50

## 2023-11-15 MED ORDER — ROCURONIUM BROMIDE 10 MG/ML (PF) SYRINGE
PREFILLED_SYRINGE | INTRAVENOUS | Status: AC
  Filled 2023-11-15: qty 10

## 2023-11-15 MED ORDER — BISACODYL 5 MG PO TBEC
5.0000 mg | DELAYED_RELEASE_TABLET | Freq: Every day | ORAL | Status: DC | PRN
  Administered 2023-11-18: 5 mg via ORAL
  Filled 2023-11-15: qty 1

## 2023-11-15 MED ORDER — LACTATED RINGERS IV SOLN: INTRAVENOUS | Status: DC

## 2023-11-15 MED ORDER — CHLORHEXIDINE GLUCONATE 0.12 % MT SOLN
OROMUCOSAL | Status: AC
  Filled 2023-11-15: qty 15

## 2023-11-15 MED ORDER — OXYCODONE HCL 5 MG/5ML PO SOLN: 5.0000 mg | Freq: Once | ORAL | Status: AC | PRN

## 2023-11-15 MED ORDER — FENTANYL CITRATE (PF) 250 MCG/5ML IJ SOLN
INTRAMUSCULAR | Status: AC
  Filled 2023-11-15: qty 5

## 2023-11-15 MED ORDER — CEFAZOLIN SODIUM-DEXTROSE 2-4 GM/100ML-% IV SOLN
2.0000 g | Freq: Four times a day (QID) | INTRAVENOUS | Status: AC
  Administered 2023-11-15 – 2023-11-16 (×3): 2 g via INTRAVENOUS
  Filled 2023-11-15 (×3): qty 100

## 2023-11-15 MED ORDER — LIDOCAINE 2% (20 MG/ML) 5 ML SYRINGE
INTRAMUSCULAR | Status: DC | PRN
  Administered 2023-11-15: 20 mg via INTRAVENOUS

## 2023-11-15 MED ORDER — ONDANSETRON HCL 4 MG PO TABS
4.0000 mg | ORAL_TABLET | Freq: Four times a day (QID) | ORAL | Status: DC | PRN
  Administered 2023-11-19: 4 mg via ORAL
  Filled 2023-11-15: qty 1

## 2023-11-15 MED ORDER — OXYCODONE HCL 5 MG PO TABS
5.0000 mg | ORAL_TABLET | Freq: Once | ORAL | Status: AC | PRN
  Administered 2023-11-15: 5 mg via ORAL

## 2023-11-15 MED ORDER — ORAL CARE MOUTH RINSE: 15.0000 mL | Freq: Once | OROMUCOSAL | Status: AC

## 2023-11-15 MED ORDER — TRANEXAMIC ACID-NACL 1000-0.7 MG/100ML-% IV SOLN
INTRAVENOUS | Status: AC
  Filled 2023-11-15: qty 100

## 2023-11-15 MED ORDER — FLEET ENEMA RE ENEM: 1.0000 | ENEMA | Freq: Once | RECTAL | Status: DC | PRN

## 2023-11-15 MED ORDER — ONDANSETRON HCL 4 MG/2ML IJ SOLN
INTRAMUSCULAR | Status: AC
  Filled 2023-11-15: qty 2

## 2023-11-15 MED ORDER — CHLORHEXIDINE GLUCONATE 0.12 % MT SOLN
15.0000 mL | Freq: Once | OROMUCOSAL | Status: AC
  Administered 2023-11-15: 15 mL via OROMUCOSAL

## 2023-11-15 MED ORDER — ONDANSETRON HCL 4 MG/2ML IJ SOLN
INTRAMUSCULAR | Status: DC | PRN
  Administered 2023-11-15: 4 mg via INTRAVENOUS

## 2023-11-15 MED ORDER — FENTANYL CITRATE (PF) 250 MCG/5ML IJ SOLN
INTRAMUSCULAR | Status: DC | PRN
  Administered 2023-11-15: 50 ug via INTRAVENOUS
  Administered 2023-11-15: 100 ug via INTRAVENOUS

## 2023-11-15 MED ORDER — CEFAZOLIN SODIUM-DEXTROSE 2-4 GM/100ML-% IV SOLN
2.0000 g | INTRAVENOUS | Status: AC
  Administered 2023-11-15: 2 g via INTRAVENOUS

## 2023-11-15 MED ORDER — SODIUM CHLORIDE 0.9 % IV SOLN: INTRAVENOUS | Status: DC

## 2023-11-15 MED ORDER — PHENYLEPHRINE 80 MCG/ML (10ML) SYRINGE FOR IV PUSH (FOR BLOOD PRESSURE SUPPORT)
PREFILLED_SYRINGE | INTRAVENOUS | Status: AC
  Filled 2023-11-15: qty 10

## 2023-11-15 MED ORDER — MIDAZOLAM HCL 2 MG/2ML IJ SOLN
INTRAMUSCULAR | Status: DC | PRN
  Administered 2023-11-15: 2 mg via INTRAVENOUS

## 2023-11-15 MED ORDER — TRANEXAMIC ACID-NACL 1000-0.7 MG/100ML-% IV SOLN
1000.0000 mg | INTRAVENOUS | Status: AC
  Administered 2023-11-15: 1000 mg via INTRAVENOUS

## 2023-11-15 MED ORDER — ROCURONIUM BROMIDE 10 MG/ML (PF) SYRINGE
PREFILLED_SYRINGE | INTRAVENOUS | Status: DC | PRN
  Administered 2023-11-15: 30 mg via INTRAVENOUS

## 2023-11-15 MED ORDER — SUCCINYLCHOLINE CHLORIDE 200 MG/10ML IV SOSY
PREFILLED_SYRINGE | INTRAVENOUS | Status: DC | PRN
  Administered 2023-11-15: 120 mg via INTRAVENOUS

## 2023-11-15 MED ORDER — CEFAZOLIN SODIUM-DEXTROSE 2-4 GM/100ML-% IV SOLN
INTRAVENOUS | Status: AC
  Filled 2023-11-15: qty 100

## 2023-11-15 MED ORDER — MEPERIDINE HCL 25 MG/ML IJ SOLN: 6.2500 mg | INTRAMUSCULAR | Status: DC | PRN

## 2023-11-15 MED ORDER — MIDAZOLAM HCL 2 MG/2ML IJ SOLN
INTRAMUSCULAR | Status: AC
Start: 2023-11-15 — End: 2023-11-15
  Filled 2023-11-15: qty 2

## 2023-11-15 MED ORDER — 0.9 % SODIUM CHLORIDE (POUR BTL) OPTIME
TOPICAL | Status: DC | PRN
  Administered 2023-11-15: 1000 mL

## 2023-11-15 MED ORDER — PROPOFOL 10 MG/ML IV BOLUS
INTRAVENOUS | Status: AC
Start: 2023-11-15 — End: 2023-11-15
  Filled 2023-11-15: qty 20

## 2023-11-15 MED ORDER — SUCCINYLCHOLINE CHLORIDE 200 MG/10ML IV SOSY
PREFILLED_SYRINGE | INTRAVENOUS | Status: AC
  Filled 2023-11-15: qty 10

## 2023-11-15 MED ORDER — ONDANSETRON HCL 4 MG/2ML IJ SOLN
4.0000 mg | Freq: Four times a day (QID) | INTRAMUSCULAR | Status: DC | PRN
  Filled 2023-11-15: qty 2

## 2023-11-15 MED ORDER — OXYCODONE HCL 5 MG PO TABS
ORAL_TABLET | ORAL | Status: AC
  Filled 2023-11-15: qty 1

## 2023-11-15 MED ORDER — POLYETHYLENE GLYCOL 3350 17 G PO PACK: 17.0000 g | PACK | Freq: Every day | ORAL | Status: DC | PRN

## 2023-11-15 MED ORDER — FE FUM-VIT C-VIT B12-FA 460-60-0.01-1 MG PO CAPS
1.0000 | ORAL_CAPSULE | Freq: Every day | ORAL | Status: DC
  Filled 2023-11-15 (×2): qty 1

## 2023-11-15 MED ORDER — POVIDONE-IODINE 10 % EX SWAB
2.0000 | Freq: Once | CUTANEOUS | Status: AC
  Administered 2023-11-15: 2 via TOPICAL

## 2023-11-15 MED ORDER — ENOXAPARIN SODIUM 40 MG/0.4ML IJ SOSY
40.0000 mg | PREFILLED_SYRINGE | INTRAMUSCULAR | Status: DC
  Administered 2023-11-16 – 2023-11-19 (×4): 40 mg via SUBCUTANEOUS
  Filled 2023-11-15 (×4): qty 0.4

## 2023-11-15 SURGICAL SUPPLY — 32 items
ALCOHOL 70% 16 OZ (MISCELLANEOUS) ×1 IMPLANT
BAG COUNTER SPONGE SURGICOUNT (BAG) ×1 IMPLANT
BIT DRILL CANN LG 4.3MM (BIT) IMPLANT
BLADE SURG 10 STRL SS (BLADE) ×1 IMPLANT
BNDG COHESIVE 4X5 TAN STRL LF (GAUZE/BANDAGES/DRESSINGS) ×1 IMPLANT
BNDG COHESIVE 6X5 TAN ST LF (GAUZE/BANDAGES/DRESSINGS) ×1 IMPLANT
CHLORAPREP W/TINT 26 (MISCELLANEOUS) ×1 IMPLANT
COVER BACK TABLE 80X110 HD (DRAPES) ×1 IMPLANT
COVER PERINEAL POST (MISCELLANEOUS) ×1 IMPLANT
COVER SURGICAL LIGHT HANDLE (MISCELLANEOUS) ×1 IMPLANT
DRAPE STERI IOBAN 125X83 (DRAPES) ×1 IMPLANT
DRESSING MEPILEX FLEX 4X4 (GAUZE/BANDAGES/DRESSINGS) ×3 IMPLANT
GLOVE SURG SYN 8.0 PF PI (GLOVE) ×2 IMPLANT
GOWN STRL REUS W/TWL LRG LVL3 (GOWN DISPOSABLE) ×1 IMPLANT
GOWN STRL REUS W/TWL XL LVL3 (GOWN DISPOSABLE) ×1 IMPLANT
GUIDEPIN VERSANAIL DSP 3.2X444 (ORTHOPEDIC DISPOSABLE SUPPLIES) IMPLANT
HFN 125 DEG 11MM X 180MM (Orthopedic Implant) IMPLANT
KIT BASIN OR (CUSTOM PROCEDURE TRAY) ×1 IMPLANT
KIT TURNOVER KIT B (KITS) ×1 IMPLANT
PAD ARMBOARD POSITIONER FOAM (MISCELLANEOUS) ×3 IMPLANT
SCREW BONE CORTICAL 5.0X32 (Screw) IMPLANT
SCREW LAG HIP NAIL 10.5X95 (Screw) IMPLANT
SOLN 0.9% NACL 1000 ML (IV SOLUTION) ×1 IMPLANT
SOLN 0.9% NACL POUR BTL 1000ML (IV SOLUTION) ×1 IMPLANT
SOLN STERILE WATER 1000 ML (IV SOLUTION) ×1 IMPLANT
SOLN STERILE WATER BTL 1000 ML (IV SOLUTION) ×1 IMPLANT
SPONGE T-LAP 18X18 ~~LOC~~+RFID (SPONGE) ×1 IMPLANT
STAPLER SKIN PROX 35W (STAPLE) ×1 IMPLANT
SUT VIC AB 1 CTX36XBRD ANBCTRL (SUTURE) ×1 IMPLANT
SUT VIC AB 2-0 SH 27X BRD (SUTURE) ×1 IMPLANT
TOWEL GREEN STERILE (TOWEL DISPOSABLE) ×1 IMPLANT
TRAY FOL W/BAG SLVR 16FR STRL (SET/KITS/TRAYS/PACK) IMPLANT

## 2023-11-15 NOTE — Assessment & Plan Note (Signed)
 Pancreatic insufficiency.  History of chronic pancreatitis secondary to alcohol use. No acute concern and she is not on any medications at home. - Continue to monitor

## 2023-11-15 NOTE — Assessment & Plan Note (Signed)
 Continue home amlodipine

## 2023-11-15 NOTE — Interval H&P Note (Signed)
 History and Physical Interval Note:  11/15/2023 10:39 AM  Gabriella Farrell  has presented today for surgery, with the diagnosis of LEFT HIP FRACTURE.  The various methods of treatment have been discussed with the patient and family. After consideration of risks, benefits and other options for treatment, the patient has consented to  Procedure(s): FIXATION, FRACTURE, INTERTROCHANTERIC, WITH INTRAMEDULLARY ROD (Left) as a surgical intervention.  The patient's history has been reviewed, patient examined, no change in status, stable for surgery.  I have reviewed the patient's chart and labs.  Questions were answered to the patient's satisfaction.     Cordella SHAUNNA Rhein

## 2023-11-15 NOTE — Progress Notes (Signed)
 Transition of Care [facility]) - CAGE-AID Screening   Patient Details  Name: Gabriella Farrell MRN: [id number] Date of Birth: 1955-05-24  [facility] of [facility]) CM/SW Contact:    Bernardino Mayotte, RN Phone Number: 11/15/2023, 11:33 PM   Clinical Narrative:  Patient does report the use of alcohol, denies use of illicit drugs. Denies resources at this time.  CAGE-AID Screening:    Have You Ever Felt You Ought to Cut Down on Your Drinking or Drug Use?: No Have People Annoyed You By Critizing Your Drinking Or Drug Use?: No Have You Felt Bad Or Guilty About Your Drinking Or Drug Use?: No Have You Ever Had a Drink or Used Drugs First Thing In The Morning to Steady Your Nerves or to Get Rid of a Hangover?: No CAGE-AID Score: 0  Substance Abuse Education Offered: No

## 2023-11-15 NOTE — Anesthesia Postprocedure Evaluation (Signed)
 Anesthesia Post Note  Patient: Gabriella Farrell  Procedure(s) Performed: FIXATION, FRACTURE, INTERTROCHANTERIC, WITH INTRAMEDULLARY ROD (Left)     Patient location during evaluation: PACU Anesthesia Type: General Level of consciousness: awake and alert, oriented and patient cooperative Pain management: pain level controlled Vital Signs Assessment: post-procedure vital signs reviewed and stable Respiratory status: spontaneous breathing, nonlabored ventilation and respiratory function stable Cardiovascular status: blood pressure returned to baseline and stable Postop Assessment: no apparent nausea or vomiting Anesthetic complications: no   There were no known notable events for this encounter.  Last Vitals:  Vitals:   11/15/23 1215 11/15/23 1230  BP: 129/67 136/68  Pulse: 89 90  Resp: 10 12  Temp:    SpO2: 94% 93%    Last Pain:  Vitals:   11/15/23 1140  TempSrc:   PainSc: 4                  Chasta Deshpande,E. Courtne Lighty

## 2023-11-15 NOTE — Assessment & Plan Note (Signed)
 History of anxiety and depression. -Continue home venlafaxine , armodafinil  and as needed Xanax 

## 2023-11-15 NOTE — Anesthesia Preprocedure Evaluation (Addendum)
 Anesthesia Evaluation  Patient identified by MRN, date of birth, ID band Patient awake    Reviewed: Allergy & Precautions, NPO status , Patient's Chart, lab work & pertinent test results, reviewed documented beta blocker date and time   History of Anesthesia Complications Negative for: history of anesthetic complications  Airway Mallampati: I  TM Distance: >3 FB Neck ROM: Full    Dental  (+) Edentulous Upper, Edentulous Lower   Pulmonary sleep apnea (does not use CPAP) , COPD,  COPD inhaler, Current Smoker and Patient abstained from smoking.   breath sounds clear to auscultation       Cardiovascular hypertension, Pt. on medications and Pt. on home beta blockers (-) angina  Rhythm:Regular Rate:Tachycardia     Neuro/Psych   Anxiety Depression    negative neurological ROS     GI/Hepatic ,GERD  Medicated and Poorly Controlled,,(+)     substance abuse  alcohol useH/o pancreatic pseudocyst   Endo/Other  Lupus: discoid  Renal/GU Renal InsufficiencyRenal disease     Musculoskeletal   Abdominal   Peds  Hematology Hb 9.3, plt 154k   Anesthesia Other Findings   Reproductive/Obstetrics                              Anesthesia Physical Anesthesia Plan  ASA: 3  Anesthesia Plan: General   Post-op Pain Management: Ofirmev  IV (intra-op)*   Induction: Intravenous  PONV Risk Score and Plan: 2 and Ondansetron  and Dexamethasone   Airway Management Planned: Oral ETT  Additional Equipment: None  Intra-op Plan:   Post-operative Plan: Extubation in OR  Informed Consent: I have reviewed the patients History and Physical, chart, labs and discussed the procedure including the risks, benefits and alternatives for the proposed anesthesia with the patient or authorized representative who has indicated his/her understanding and acceptance.       Plan Discussed with: CRNA and Surgeon  Anesthesia Plan  Comments:          Anesthesia Quick Evaluation

## 2023-11-15 NOTE — Assessment & Plan Note (Signed)
 History of COPD and emphysema. No acute concern -Continue home bronchodilators

## 2023-11-15 NOTE — Progress Notes (Signed)
  Progress Note   Patient: Gabriella Farrell FMW:[id number] DOB: 1955/12/26 DOA: 11/14/2023     1 DOS: the patient was seen and examined on 11/15/2023   Brief hospital course: Partly taken from H&P.  Gabriella Farrell is a 68 y.o. female with medical history significant of hypertension, hyperlipidemia, GERD, lupus, depression, anxiety, alcohol use, emphysema, chronic pancreatitis with pancreatic insufficiency, fatty liver disease presenting after having a mechanical fall at home.   On presentation vital stable and labs mostly at baseline.  Hemoglobin at 10.9,  decreased as compared to the prior check of 13.3 51-month ago.  Left hip x-ray shows acute intricate Tarik fracture of the left proximal femur.   Orthopedic surgery was consulted and patient was admitted under [facility].  10/12: Vital stable, hemoglobin further decreased to 9.3, macrocytosis at 102, B12 of 203, folate normal. Starting on supplement.  Going to the OR.  Assessment and Plan: * Fracture of proximal end of left femur (HCC) Secondary to mechanical fall while going downstairs, going to the OR with orthopedic surgery today. -Follow-up with surgical recommendations -Continue with pain management  Macrocytic anemia History of B12 deficiency, alcohol use can be contributory.  Hemoglobin on decreased to 9.3 after hip fracture.  No occult bleeding. - Starting on supplement -Monitor hemoglobin -Transfuse if below 7  Alcohol abuse Denies ongoing use. - Monitor with CIWA assessment  Centrilobular emphysema (HCC) History of COPD and emphysema. No acute concern -Continue home bronchodilators  Chronic pancreatitis (HCC) Pancreatic insufficiency.  History of chronic pancreatitis secondary to alcohol use. No acute concern and she is not on any medications at home. - Continue to monitor  Essential hypertension - Continue home amlodipine   Depression History of anxiety and depression. -Continue home venlafaxine , armodafinil  and  as needed Xanax   Lupus erythematosus Currently not taking hydroxychloroquine and prednisone per med rec.   Subjective: Patient was complaining of 9/10 left hip pain specially with any movement.  She was getting ready to go to the OR.  Physical Exam: Vitals:   11/15/23 1145 11/15/23 1200 11/15/23 1215 11/15/23 1230  BP: 132/78 (!) 145/77 129/67 136/68  Pulse: (!) 102 (!) 104 89 90  Resp: 14 16 10 12   Temp:      TempSrc:      SpO2: 92% 92% 94% 93%  Weight:      Height:       General.  Frail and malnourished lady, in no acute distress. Pulmonary.  Lungs clear bilaterally, normal respiratory effort. CV.  Regular rate and rhythm, no JVD, rub or murmur. Abdomen.  Soft, nontender, nondistended, BS positive. CNS.  Alert and oriented .  No focal neurologic deficit. Extremities.  No edema, no cyanosis, pulses intact and symmetrical. Psychiatry.  Judgment and insight appears normal.   Data Reviewed: Prior data reviewed  Family Communication: Discussed with patient  Disposition: Status is: Inpatient Remains inpatient appropriate because: Severity of illness  Planned Discharge Destination: To be determined  DVT prophylaxis.  Lovenox Time spent: 45 minutes  This record has been created using Conservation officer, historic buildings. Errors have been sought and corrected,but may not always be located. Such creation errors do not reflect on the standard of care.   Author: Amaryllis Dare, MD 11/15/2023 12:47 PM  For on call review [email or web address].

## 2023-11-15 NOTE — Assessment & Plan Note (Signed)
 Denies ongoing use. - Monitor with CIWA assessment

## 2023-11-15 NOTE — Anesthesia Procedure Notes (Addendum)
 Procedure Name: Intubation Date/Time: 11/15/2023 10:50 AM  Performed by: Christopher Comings, CRNAPre-anesthesia Checklist: Patient identified, Emergency Drugs available, Suction available and Patient being monitored Patient Re-evaluated:Patient Re-evaluated prior to induction Oxygen Delivery Method: Circle system utilized Preoxygenation: Pre-oxygenation with 100% oxygen Induction Type: IV induction Ventilation: Mask ventilation without difficulty Laryngoscope Size: Mac and 3 Grade View: Grade I Tube type: Oral Tube size: 7.0 mm Number of attempts: 1 Airway Equipment and Method: Stylet and Oral airway Placement Confirmation: ETT inserted through vocal cords under direct vision, positive ETCO2 and breath sounds checked- equal and bilateral Secured at: 22 cm Tube secured with: Tape Dental Injury: Teeth and Oropharynx as per pre-operative assessment

## 2023-11-15 NOTE — Assessment & Plan Note (Signed)
 Secondary to mechanical fall while going downstairs, going to the OR with orthopedic surgery today. -Follow-up with surgical recommendations -Continue with pain management

## 2023-11-15 NOTE — Op Note (Signed)
 DATE OF SURGERY:  11/15/2023  TIME: 5:29 PM  PATIENT NAME:  Gabriella Farrell  AGE: 68 y.o. hold fonder injuring her left hip.  She was seen in the emergency room and told open trochanteric hip fracture.  She was admitted to the floor.  Prior to the surgery, we discussed the risk benefits alternatives.  The risks include bleeding infection injury nerves injury tendon malunion heart failure risk of anesthesia, the need for additional surgeries and risk of blood clots.  Informed signs obtained  PRE-OPERATIVE DIAGNOSIS:  LEFT HIP FRACTURE  POST-OPERATIVE DIAGNOSIS:  SAME  PROCEDURE:  FIXATION, FRACTURE, INTERTROCHANTERIC, WITH INTRAMEDULLARY ROD  SURGEON:  Cordella SHAUNNA Rhein  ASSISTANT:   OPERATIVE IMPLANTS:  Implant Name Type Inv. Item Serial No. Manufacturer Lot No. LRB No. Used Action  HFN 125 DEG X - [id number] Orthopedic Implant HFN 125 DEG X  [employer]ON(ORTH,TRAU,BIO,SG) [id number] Left 1 Implanted  SCREW LAG HIP NAIL 10.5X95 - [id number] Screw SCREW LAG HIP NAIL 10.5X95  [employer]IM[employer]ON(ORTH,TRAU,BIO,SG) [id number] Left 1 Implanted  SCREW BONE CORTICAL 5.0X32 - [id number] Screw SCREW BONE CORTICAL 5.0X32  ZIMMER [employer]ON(ORTH,TRAU,BIO,SG) [id number] Left 1 Implanted    UNIQUE ASPECTS OF THE CASE:    ESTIMATED BLOOD LOSS: 50  PREOPERATIVE INDICATIONS:  Gabriella Farrell is a 68 y.o. year old who fell and suffered an LEFT HIP FRACTURE. She was brought into the ER and then admitted and optimized and then elected for surgical intervention.    The risks benefits and alternatives were discussed with the patient including but not limited to the risks of nonoperative treatment, versus surgical intervention including infection, bleeding, nerve injury, malunion, nonunion, hardware prominence, hardware failure, need for hardware removal, blood clots, cardiopulmonary complications, morbidity, mortality, among others, and they were willing to proceed.    OPERATIVE PROCEDURE:  The patient  was brought to the operating room and placed in the supine position. Anesthesia was administered. She was placed on the fracture table.  Closed reduction was performed under C-arm guidance.  Time out was then performed after sterile prep and drape. She received preoperative antibiotics.  Small incision proximal to the greater trochanter was made and carried down through skin and subcutaneous tissue.  Threaded guidewire was directed at the tip of the greater trochanter and advanced into the proximal metaphysis.  Positioning was confirmed with fluoroscopy.  I then used an entry reamer to enter the medullary canal.  I then passed a 11 x 180 mm Zimmer nail down the center of the canal attached to the targeting arm.  I then used the targeting arm to make a percutaneous incision and directed a threaded guidewire up into the head/neck segment.  I confirmed adequate tip apex distance and measured the length.  I decided to place a 95 mm screw.  I then drilled the path for the compression screw and placed an antirotation bar.  I then placed the lag screw and then placed the compression screw and compressed approximately 1 mm.  The proximal portion of the nail was statically locked.   I used the targeting arm to place a distal interlocking screw.  The targeting arm was removed.  Final fluoroscopic imaging was obtained.    The wounds were irrigated copiously, and vancomycin  powder was placed in the wounds.  The gluteal fascia was closed with #1  Vicryl, and skin was closed with 2-0 Vicryl and staples.  Sterile dressing was applied with Mepilex dressings.  The patient was awakened and returned to PACU in stable  and satisfactory condition. There were no complications and the patient tolerated the procedure well.   Post op recs: WB: WBAT left LE Abx: ancef  x23 hours post op Dressing: keep intact until follow up, change PRN if soiled or saturated. DVT prophylaxis: lovenox starting POD1 x4 weeks Follow up: 2 weeks  after surgery for a wound check with Dr. Reyne at [facility].  Address: [address], [place], [place] [address]  Office Phone: [phone]  Cathlyn Reyne, MD Orthopaedic Surgery

## 2023-11-15 NOTE — Hospital Course (Addendum)
 Partly taken from H&P.  Gabriella Farrell is a 68 y.o. female with medical history significant of hypertension, hyperlipidemia, GERD, lupus, depression, anxiety, alcohol use, emphysema, chronic pancreatitis with pancreatic insufficiency, fatty liver disease presenting after having a mechanical fall at home.   On presentation vital stable and labs mostly at baseline.  Hemoglobin at 10.9,  decreased as compared to the prior check of 13.3 28-month ago.  Left hip x-ray shows acute intricate Tarik fracture of the left proximal femur.   Orthopedic surgery was consulted and patient was admitted under [facility].  10/12: Vital stable, hemoglobin further decreased to 9.3, macrocytosis at 102, B12 of 203, folate normal. Starting on supplement.  Going to the OR.  10/13: Vital stable, s/p ORIF, hemoglobin decreased to 8.9 likely secondary to trauma and intraoperative blood loss.  Mild leukocytosis likely reactive after the surgery.  Patient does not want to go to rehab stating that she has enough support at home.  10/14: Remained hemodynamically stable, agreed to go to SNF now.  TOC started the workup

## 2023-11-15 NOTE — Plan of Care (Signed)
  Problem: Education: Goal: Knowledge of General Education information will improve Description: Including pain rating scale, medication(s)/side effects and non-pharmacologic comfort measures Outcome: Progressing   Problem: Nutrition: Goal: Adequate nutrition will be maintained Outcome: Progressing   Problem: Coping: Goal: Level of anxiety will decrease Outcome: Progressing   Problem: Elimination: Goal: Will not experience complications related to bowel motility Outcome: Progressing Goal: Will not experience complications related to urinary retention Outcome: Progressing   Problem: Pain Managment: Goal: General experience of comfort will improve and/or be controlled Outcome: Progressing   Problem: Safety: Goal: Ability to remain free from injury will improve Outcome: Progressing

## 2023-11-15 NOTE — Assessment & Plan Note (Signed)
 History of B12 deficiency, alcohol use can be contributory.  Hemoglobin on decreased to 9.3 after hip fracture.  No occult bleeding. - Starting on supplement -Monitor hemoglobin -Transfuse if below 7

## 2023-11-15 NOTE — Assessment & Plan Note (Signed)
 Currently not taking hydroxychloroquine and prednisone per med rec.

## 2023-11-15 NOTE — Transfer of Care (Signed)
 Immediate Anesthesia Transfer of Care Note  Patient: Gabriella Farrell  Procedure(s) Performed: FIXATION, FRACTURE, INTERTROCHANTERIC, WITH INTRAMEDULLARY ROD (Left)  Patient Location: PACU  Anesthesia Type:General  Level of Consciousness: awake, alert , and oriented  Airway & Oxygen Therapy: Patient connected to face mask oxygen  Post-op Assessment: Report given to RN and Post -op Vital signs reviewed and stable  Post vital signs: Reviewed and stable  Last Vitals:  Vitals Value Taken Time  BP 141/65 11/15/23 11:38  Temp    Pulse 105 11/15/23 11:39  Resp 15 11/15/23 11:39  SpO2 95% 11/15/23 11:39  Vitals shown include unfiled device data.  Last Pain:  Vitals:   11/15/23 1017  TempSrc: Oral  PainSc:          Complications: There were no known notable events for this encounter.

## 2023-11-16 ENCOUNTER — Inpatient Hospital Stay (HOSPITAL_COMMUNITY)

## 2023-11-16 ENCOUNTER — Encounter (HOSPITAL_COMMUNITY): Payer: Self-pay

## 2023-11-16 DIAGNOSIS — J432 Centrilobular emphysema: Secondary | ICD-10-CM | POA: Diagnosis not present

## 2023-11-16 DIAGNOSIS — D539 Nutritional anemia, unspecified: Secondary | ICD-10-CM | POA: Diagnosis not present

## 2023-11-16 DIAGNOSIS — F1010 Alcohol abuse, uncomplicated: Secondary | ICD-10-CM | POA: Diagnosis not present

## 2023-11-16 DIAGNOSIS — S72002A Fracture of unspecified part of neck of left femur, initial encounter for closed fracture: Secondary | ICD-10-CM | POA: Diagnosis not present

## 2023-11-16 LAB — CBC
HCT: 25.9 % — ABNORMAL LOW (ref 36.0–46.0)
Hemoglobin: 8.9 g/dL — ABNORMAL LOW (ref 12.0–15.0)
MCH: 35.2 pg — ABNORMAL HIGH (ref 26.0–34.0)
MCHC: 34.4 g/dL (ref 30.0–36.0)
MCV: 102.4 fL — ABNORMAL HIGH (ref 80.0–100.0)
Platelets: 168 K/uL (ref 150–400)
RBC: 2.53 MIL/uL — ABNORMAL LOW (ref 3.87–5.11)
RDW: 12.3 % (ref 11.5–15.5)
WBC: 13 K/uL — ABNORMAL HIGH (ref 4.0–10.5)
nRBC: 0 % (ref 0.0–0.2)

## 2023-11-16 LAB — BASIC METABOLIC PANEL WITH GFR
Anion gap: 11 (ref 5–15)
BUN: 6 mg/dL — ABNORMAL LOW (ref 8–23)
CO2: 23 mmol/L (ref 22–32)
Calcium: 8 mg/dL — ABNORMAL LOW (ref 8.9–10.3)
Chloride: 99 mmol/L (ref 98–111)
Creatinine, Ser: 0.7 mg/dL (ref 0.44–1.00)
GFR, Estimated: >60 mL/min (ref >60)
Glucose, Bld: 144 mg/dL — ABNORMAL HIGH (ref 70–99)
Potassium: 3.8 mmol/L (ref 3.5–5.1)
Sodium: 133 mmol/L — ABNORMAL LOW (ref 135–145)

## 2023-11-16 MED ORDER — FE FUM-VIT C-VIT B12-FA 460-60-0.01-1 MG PO CAPS
1.0000 | ORAL_CAPSULE | Freq: Two times a day (BID) | ORAL | Status: DC
Start: 2023-11-16 — End: 2023-11-19
  Administered 2023-11-16 – 2023-11-19 (×7): 1 via ORAL
  Filled 2023-11-16 (×8): qty 1

## 2023-11-16 NOTE — Progress Notes (Signed)
 Pt refused bed alarm, despite education on fall risks and interventions. Pt continues to refused and confirmation of understanding.  Pt is alert and otiented, staes I'm not a 68 year old.

## 2023-11-16 NOTE — Plan of Care (Signed)

## 2023-11-16 NOTE — Progress Notes (Signed)
 Orthopaedic Progress Note  S: Patient is resting company bed.  She complains of left knee pain  O:  Vitals:   11/16/23 0456 11/16/23 0724  BP: 112/83 127/70  Pulse: 88 87  Resp: 16 16  Temp: 98.4 F (36.9 C) 97.7 F (36.5 C)  SpO2: 94% 97%    Well-appearing woman in no acute distress.  Clean dressings on her left hip.  Thigh is soft.  Calf is soft and nontender.  Intact sensation in the saphenous sural tibial and peroneal distributions 5-5 strength EHL FHL gastrocs and tibialis anterior.  Mild tenderness around the left knee.    Labs:  Results for orders placed or performed during the hospital encounter of 11/14/23 (from the past 24 hours)  CBC     Status: Abnormal   Collection Time: 11/16/23  6:15 AM  Result Value Ref Range   WBC 13.0 (H) 4.0 - 10.5 K/uL   RBC 2.53 (L) 3.87 - 5.11 MIL/uL   Hemoglobin 8.9 (L) 12.0 - 15.0 g/dL   HCT 74.0 (L) 63.9 - 53.9 %   MCV 102.4 (H) 80.0 - 100.0 fL   MCH 35.2 (H) 26.0 - 34.0 pg   MCHC 34.4 30.0 - 36.0 g/dL   RDW 87.6 88.4 - 84.4 %   Platelets 168 150 - 400 K/uL   nRBC 0.0 0.0 - 0.2 %    Assessment: Postop day 1 status post IM rod left hip  The patient's hip is doing quite well.  She has some mild tenderness around the incision sites but overall her pain is well-controlled.  She does have pain around the left knee.  I think this is most likely from positioning and stiffness but we will get some x-rays just to make sure.  Her H&H is low.  Would defer to medicine for further management of this.  She may be mobilized with physical therapy assuming the knee x-rays are negative.  This will be weightbearing as tolerated.  Will start Lovenox for DVT prophylaxis.  She can plan for DC to home versus rehab depending how she progresses with therapy  Injuries: Left intertrochanteric hip fracture  Weightbearing: As tolerated pending x-rays left knee  Insicional and dressing care: Will change tomorrow  Pain management: Continue current pain  regimen  VTE prophylaxis: Lovenox   Dispo: Home versus rehab depending on how she does with therapy  Follow - up plan: 2 weeks   Cordella Rhein, MD, MS [facility] [phone]

## 2023-11-16 NOTE — Assessment & Plan Note (Signed)
 Secondary to mechanical fall while going downstairs, s/p ORIF -Continue with supportive care -PT/OT evaluation-recommending SNF -Continue with pain management

## 2023-11-16 NOTE — Assessment & Plan Note (Signed)
 Denies ongoing use. - Monitor with CIWA assessment

## 2023-11-16 NOTE — Discharge Instructions (Signed)
 Orthopaedic Discharge Instructions   General Discharge Instructions  WEIGHT BEARING STATUS: Weightbearing as tolerated  RANGE OF MOTION/ACTIVITY: No restrictions  Wound Care: You may remove your surgical dressing on 11/18/2023. Incisions can be left open to air if there is no drainage. Once the incision is completely dry and without drainage, it may be left open to air out.  Showering may begin 11/18/2023.  Clean incision gently with soap and water .  DVT/PE prophylaxis: Lovenox   Diet: as you were eating previously.  Can use over the counter stool softeners and bowel preparations, such as Miralax , to help with bowel movements.  Narcotics can be constipating.  Be sure to drink plenty of fluids  PAIN MEDICATION USE AND EXPECTATIONS  You have likely been given narcotic medications to help control your pain.  After a traumatic event that results in an fracture (broken bone) with or without surgery, it is ok to use narcotic pain medications to help control one's pain.  We understand that everyone responds to pain differently and each individual patient will be evaluated on a regular basis for the continued need for narcotic medications. Ideally, narcotic medication use should last no more than 6-8 weeks (coinciding with fracture healing).   As a patient it is your responsibility as well to monitor narcotic medication use and report the amount and frequency you use these medications when you come to your office visit.   We would also advise that if you are using narcotic medications, you should take a dose prior to therapy to maximize you participation.  IF YOU ARE ON NARCOTIC MEDICATIONS IT IS NOT PERMISSIBLE TO OPERATE A MOTOR VEHICLE (MOTORCYCLE/CAR/TRUCK/MOPED) OR HEAVY MACHINERY DO NOT MIX NARCOTICS WITH OTHER CNS (CENTRAL NERVOUS SYSTEM) DEPRESSANTS SUCH AS ALCOHOL  POST-OPERATIVE OPIOID TAPER INSTRUCTIONS: It is important to wean off of your opioid medication as soon as possible. If you do  not need pain medication after your surgery it is ok to stop day one. Opioids include: Codeine, Hydrocodone (Norco, Vicodin), Oxycodone (Percocet, oxycontin ) and hydromorphone  amongst others.  Long term and even short term use of opiods can cause: Increased pain response Dependence Constipation Depression Respiratory depression And more.  Withdrawal symptoms can include Flu like symptoms Nausea, vomiting And more Techniques to manage these symptoms Hydrate well Eat regular healthy meals Stay active Use relaxation techniques(deep breathing, meditating, yoga) Do Not substitute Alcohol to help with tapering If you have been on opioids for less than two weeks and do not have pain than it is ok to stop all together.  Plan to wean off of opioids This plan should start within one week post op of your fracture surgery  Maintain the same interval or time between taking each dose and first decrease the dose.  Cut the total daily intake of opioids by one tablet each day Next start to increase the time between doses. The last dose that should be eliminated is the evening dose.    STOP SMOKING OR USING NICOTINE PRODUCTS!!!!  As discussed nicotine severely impairs your body's ability to heal surgical and traumatic wounds but also impairs bone healing.  Wounds and bone heal by forming microscopic blood vessels (angiogenesis) and nicotine is a vasoconstrictor (essentially, shrinks blood vessels).  Therefore, if vasoconstriction occurs to these microscopic blood vessels they essentially disappear and are unable to deliver necessary nutrients to the healing tissue.  This is one modifiable factor that you can do to dramatically increase your chances of healing your injury.  (This means no smoking, no nicotine gum,  patches, etc)  DO NOT USE NONSTEROIDAL ANTI-INFLAMMATORY DRUGS (NSAID'S)  Using products such as Advil  (ibuprofen ), Aleve  (naproxen ), Motrin  (ibuprofen ) for additional pain control during  fracture healing can delay and/or prevent the healing response.  If you would like to take over the counter (OTC) medication, Tylenol  (acetaminophen ) is ok.  However, some narcotic medications that are given for pain control contain acetaminophen  as well. Therefore, you should not exceed more than 4000 mg of tylenol  in a day if you do not have liver disease.  Also note that there are may OTC medicines, such as cold medicines and allergy medicines that my contain tylenol  as well.  If you have any questions about medications and/or interactions please ask your doctor/PA or your pharmacist.      ICE AND ELEVATE INJURED/OPERATIVE EXTREMITY  Using ice and elevating the injured extremity above your heart can help with swelling and pain control.  Icing in a pulsatile fashion, such as 20 minutes on and 20 minutes off, can be followed.    Do not place ice directly on skin. Make sure there is a barrier between to skin and the ice pack.    Using frozen items such as frozen peas works well as the conform nicely to the are that needs to be iced.  USE AN ACE WRAP OR TED HOSE FOR SWELLING CONTROL  In addition to icing and elevation, Ace wraps or TED hose are used to help limit and resolve swelling.  It is recommended to use Ace wraps or TED hose until you are informed to stop.    When using Ace Wraps start the wrapping distally (farthest away from the body) and wrap proximally (closer to the body)   Example: If you had surgery on your leg or thing and you do not have a splint on, start the ace wrap at the toes and work your way up to the thigh        If you had surgery on your upper extremity and do not have a splint on, start the ace wrap at your fingers and work your way up to the upper arm    CALL THE OFFICE FOR MEDICATION REFILLS OR WITH ANY QUESTIONS/CONCERNS: [phone]     Discharge Wound Care Instructions  Do NOT apply any ointments, solutions or lotions to pin sites or surgical wounds.  These  prevent needed drainage and even though solutions like hydrogen peroxide kill bacteria, they also damage cells lining the pin sites that help fight infection.  Applying lotions or ointments can keep the wounds moist and can cause them to breakdown and open up as well. This can increase the risk for infection. When in doubt call the office.  Surgical incisions should be dressed daily.  If any drainage is noted, use one layer of adaptic or Mepitel, then gauze, Kerlix, and an ace wrap. - These dressing supplies should be available at local medical supply stores ([facility], [facility], etc) as well as Insurance claims handler ([facility], [facility], [employer], etc)  Once the incision is completely dry and without drainage, it may be left open to air out.  Showering may begin 36-48 hours later.  Cleaning gently with soap and water .  Traumatic wounds should be dressed daily as well.    One layer of adaptic, gauze, Kerlix, then ace wrap.  The adaptic can be discontinued once the draining has ceased    If you have a wet to dry dressing: wet the gauze with saline the squeeze as much saline  out so the gauze is moist (not soaking wet), place moistened gauze over wound, then place a dry gauze over the moist one, followed by Kerlix wrap, then ace wrap.    Call office for the following: Temperature greater than 101F Persistent nausea and vomiting Severe uncontrolled pain Redness, tenderness, or signs of infection (pain, swelling, redness, odor or green/yellow discharge around the site) Difficulty breathing, headache or visual disturbances Hives Persistent dizziness or light-headedness Extreme fatigue Any other questions or concerns you may have after discharge  In an emergency, call 911 or go to an Emergency Department at a nearby hospital  OTHER HELPFUL INFORMATION  If you had a block, it will wear off between 8-24 hrs postop typically.  This is period when your pain may go from nearly zero to the pain you  would have had postop without the block.  This is an abrupt transition but nothing dangerous is happening.  You may take an extra dose of narcotic when this happens.  You should wean off your narcotic medicines as soon as you are able.  Most patients will be off or using minimal narcotics before their first postop appointment.   We suggest you use the pain medication the first night prior to going to bed, in order to ease any pain when the anesthesia wears off. You should avoid taking pain medications on an empty stomach as it will make you nauseous.  Do not drink alcoholic beverages or take illicit drugs when taking pain medications.  In most states it is against the law to drive while you are in a splint or sling.  And certainly against the law to drive while taking narcotics.  You may return to work/school in the next couple of days when you feel up to it.   Pain medication may make you constipated.  Below are a few solutions to try in this order: Decrease the amount of pain medication if you aren't having pain. Drink lots of decaffeinated fluids. Drink prune juice and/or each dried prunes  If the first 3 don't work start with additional solutions Take Colace - an over-the-counter stool softener Take Senokot - an over-the-counter laxative Take Miralax  - a stronger over-the-counter laxative   Follow up with Dr Reyne in 2 weeks  Cordella Reyne, MD, MS [facility] [phone]

## 2023-11-16 NOTE — Progress Notes (Signed)
 OT Cancellation Note  Patient Details Name: Gabriella Farrell MRN: [id number] DOB: Jun 23, 1955   Cancelled Treatment:    Reason Eval/Treat Not Completed: Other (comment). Pt x-ray of L knee taken this morning and awaiting final read to determine WB status. Bed rest orders still in place. Will hold OT eval until x-ray has been read and bed rest orders discontinued; follow-up as appropriate.   Maurilio CROME, OTR/LSABRA  [facility]  Office: [phone]   Maurilio PARAS Inesha Sow 11/16/2023, 3:17 PM

## 2023-11-16 NOTE — Assessment & Plan Note (Signed)
 History of B12 deficiency, alcohol use can be contributory.  Hemoglobin further decreased to 8.9 after the surgery, no occult bleeding. - Starting on supplement -Monitor hemoglobin -Transfuse if below 7

## 2023-11-16 NOTE — Progress Notes (Signed)
 Progress Note   Patient: Gabriella Farrell FMW:[id number] DOB: 06-23-1955 DOA: 11/14/2023     2 DOS: the patient was seen and examined on 11/16/2023   Brief hospital course: Partly taken from H&P.  Gabriella Farrell is a 68 y.o. female with medical history significant of hypertension, hyperlipidemia, GERD, lupus, depression, anxiety, alcohol use, emphysema, chronic pancreatitis with pancreatic insufficiency, fatty liver disease presenting after having a mechanical fall at home.   On presentation vital stable and labs mostly at baseline.  Hemoglobin at 10.9,  decreased as compared to the prior check of 13.3 71-month ago.  Left hip x-ray shows acute intricate Tarik fracture of the left proximal femur.   Orthopedic surgery was consulted and patient was admitted under [facility].  10/12: Vital stable, hemoglobin further decreased to 9.3, macrocytosis at 102, B12 of 203, folate normal. Starting on supplement.  Going to the OR.  10/13: Vital stable, s/p ORIF, hemoglobin decreased to 8.9 likely secondary to trauma and intraoperative blood loss.  Mild leukocytosis likely reactive after the surgery.  Patient does not want to go to rehab stating that she has enough support at home.  Assessment and Plan: * Fracture of proximal end of left femur (HCC) Secondary to mechanical fall while going downstairs, s/p ORIF -Continue with supportive care -PT/OT evaluation -Continue with pain management  Macrocytic anemia History of B12 deficiency, alcohol use can be contributory.  Hemoglobin further decreased to 8.9 after the surgery, no occult bleeding. - Starting on supplement -Monitor hemoglobin -Transfuse if below 7  Alcohol abuse Denies ongoing use. - Monitor with CIWA assessment  Centrilobular emphysema (HCC) History of COPD and emphysema. No acute concern -Continue home bronchodilators  Chronic pancreatitis (HCC) Pancreatic insufficiency.  History of chronic pancreatitis secondary to alcohol  use. No acute concern and she is not on any medications at home. - Continue to monitor  Essential hypertension - Continue home amlodipine   Depression History of anxiety and depression. -Continue home venlafaxine , armodafinil  and as needed Xanax   Lupus erythematosus Currently not taking hydroxychloroquine and prednisone per med rec.  Subjective: Patient was sitting in chair when seen today.  Pain with ambulation.  Does not want to go to rehab stating that she had enough support at home.  Physical Exam: Vitals:   11/15/23 1922 11/16/23 0456 11/16/23 0724 11/16/23 1334  BP: (!) 108/59 112/83 127/70 124/65  Pulse: 95 88 87 89  Resp: 16 16 16 16   Temp: 97.9 F (36.6 C) 98.4 F (36.9 C) 97.7 F (36.5 C) 98.1 F (36.7 C)  TempSrc:   Oral Oral  SpO2: 96% 94% 97% 90%  Weight:      Height:       General.  Frail and malnourished elderly lady, in no acute distress. Pulmonary.  Lungs clear bilaterally, normal respiratory effort. CV.  Regular rate and rhythm, no JVD, rub or murmur. Abdomen.  Soft, nontender, nondistended, BS positive. CNS.  Alert and oriented .  No focal neurologic deficit. Extremities.  No edema, no cyanosis, pulses intact and symmetrical. Psychiatry.  Judgment and insight appears normal.   Data Reviewed: Prior data reviewed  Family Communication: Discussed with patient, no family at bedside  Disposition: Status is: Inpatient Remains inpatient appropriate because: Severity of illness  Planned Discharge Destination: To be determined  DVT prophylaxis.  Lovenox Time spent: 44 minutes  This record has been created using Conservation officer, historic buildings. Errors have been sought and corrected,but may not always be located. Such creation errors do not reflect on the standard  of care.   Author: Amaryllis Dare, MD 11/16/2023 3:43 PM  For on call review [email or web address].

## 2023-11-16 NOTE — Progress Notes (Signed)
 PT Cancellation Note  Patient Details Name: Gabriella Farrell MRN: [id number] DOB: July 09, 1955   Cancelled Treatment:    Reason Eval/Treat Not Completed: Patient not medically ready. Pt with x-rays of L knee taken this morning and awaiting final read to determine WB status. Will hold PT eval until x-rays have been read.    [name] 11/16/2023, 11:04 AM  [name], PT, DPT Acute Rehabilitation Services Secure Chat Preferred Office: [phone]

## 2023-11-17 DIAGNOSIS — S72002A Fracture of unspecified part of neck of left femur, initial encounter for closed fracture: Secondary | ICD-10-CM | POA: Diagnosis not present

## 2023-11-17 DIAGNOSIS — J432 Centrilobular emphysema: Secondary | ICD-10-CM | POA: Diagnosis not present

## 2023-11-17 DIAGNOSIS — F1010 Alcohol abuse, uncomplicated: Secondary | ICD-10-CM | POA: Diagnosis not present

## 2023-11-17 DIAGNOSIS — D539 Nutritional anemia, unspecified: Secondary | ICD-10-CM | POA: Diagnosis not present

## 2023-11-17 LAB — CBC
HCT: 22.9 % — ABNORMAL LOW (ref 36.0–46.0)
Hemoglobin: 8.1 g/dL — ABNORMAL LOW (ref 12.0–15.0)
MCH: 35.5 pg — ABNORMAL HIGH (ref 26.0–34.0)
MCHC: 35.4 g/dL (ref 30.0–36.0)
MCV: 100.4 fL — ABNORMAL HIGH (ref 80.0–100.0)
Platelets: 160 K/uL (ref 150–400)
RBC: 2.28 MIL/uL — ABNORMAL LOW (ref 3.87–5.11)
RDW: 12.4 % (ref 11.5–15.5)
WBC: 11.9 K/uL — ABNORMAL HIGH (ref 4.0–10.5)
nRBC: 0 % (ref 0.0–0.2)

## 2023-11-17 NOTE — Plan of Care (Signed)

## 2023-11-17 NOTE — Care Management Important Message (Signed)
 Important Message  Patient Details  Name: Kyoko Elsea MRN: [id number] Date of Birth: [birth year]   Important Message Given:  Yes - Medicare IM     Jon Cruel 11/17/2023, 4:35 PM

## 2023-11-17 NOTE — Progress Notes (Signed)
 Progress Note   Patient: Gabriella Farrell FMW:[id number] DOB: 24-Jan-1956 DOA: 11/14/2023     3 DOS: the patient was seen and examined on 11/17/2023   Brief hospital course: Partly taken from H&P.  Gabriella Farrell is a 68 y.o. female with medical history significant of hypertension, hyperlipidemia, GERD, lupus, depression, anxiety, alcohol use, emphysema, chronic pancreatitis with pancreatic insufficiency, fatty liver disease presenting after having a mechanical fall at home.   On presentation vital stable and labs mostly at baseline.  Hemoglobin at 10.9,  decreased as compared to the prior check of 13.3 57-month ago.  Left hip x-ray shows acute intricate Tarik fracture of the left proximal femur.   Orthopedic surgery was consulted and patient was admitted under [facility].  10/12: Vital stable, hemoglobin further decreased to 9.3, macrocytosis at 102, B12 of 203, folate normal. Starting on supplement.  Going to the OR.  10/13: Vital stable, s/p ORIF, hemoglobin decreased to 8.9 likely secondary to trauma and intraoperative blood loss.  Mild leukocytosis likely reactive after the surgery.  Patient does not want to go to rehab stating that she has enough support at home.  10/14: Remained hemodynamically stable, agreed to go to SNF now.  TOC started the workup  Assessment and Plan: * Fracture of proximal end of left femur (HCC) Secondary to mechanical fall while going downstairs, s/p ORIF -Continue with supportive care -PT/OT evaluation-recommending SNF -Continue with pain management  Macrocytic anemia History of B12 deficiency, alcohol use can be contributory.  Hemoglobin further decreased to 8.1 today , no occult bleeding. Acute on chronic anemia likely secondary to recent fracture and surgery - Starting on supplement -Monitor hemoglobin -Transfuse if below 7  Alcohol abuse Denies ongoing use. - Monitor with CIWA assessment  Centrilobular emphysema (HCC) History of COPD and  emphysema. No acute concern -Continue home bronchodilators  Chronic pancreatitis (HCC) Pancreatic insufficiency.  History of chronic pancreatitis secondary to alcohol use. No acute concern and she is not on any medications at home. - Continue to monitor  Essential hypertension - Continue home amlodipine   Depression History of anxiety and depression. -Continue home venlafaxine , armodafinil  and as needed Xanax   Lupus erythematosus Currently not taking hydroxychloroquine and prednisone per med rec.  Subjective: Patient was sitting comfortably in chair and just finished working with PT when seen today.  Pain seems well-controlled.  She wants to go home but after discussing with her husband decided to go to SNF.  Physical Exam: Vitals:   11/17/23 0600 11/17/23 0740 11/17/23 0744 11/17/23 1331  BP:  136/69  136/68  Pulse:  85 89 83  Resp:  18 18 18   Temp:  98.2 F (36.8 C)  97.9 F (36.6 C)  TempSrc:  Oral  Oral  SpO2: 93% 90% (!) 89% 96%  Weight:      Height:       General.  Frail and malnourished elderly lady, in no acute distress. Pulmonary.  Lungs clear bilaterally, normal respiratory effort. CV.  Regular rate and rhythm, no JVD, rub or murmur. Abdomen.  Soft, nontender, nondistended, BS positive. CNS.  Alert and oriented .  No focal neurologic deficit. Extremities.  No edema, no cyanosis, pulses intact and symmetrical. Psychiatry.  Judgment and insight appears normal.    Data Reviewed: Prior data reviewed  Family Communication:   Disposition: Status is: Inpatient Remains inpatient appropriate because: Severity of illness  Planned Discharge Destination: To be determined  DVT prophylaxis.  Lovenox Time spent: 43 minutes  This record has been created using  [employer]. Errors have been sought and corrected,but may not always be located. Such creation errors do not reflect on the standard of care.   Author: Amaryllis Dare, MD 11/17/2023 2:30  PM  For on call review [email or web address].

## 2023-11-17 NOTE — Progress Notes (Signed)
 RE:  Gabriella Farrell       Date of Birth:  [birth year]     Date:  11/17/23        To Whom It May Concern:  Please be advised that the above-named patient will require a short-term nursing home stay - anticipated 30 days or less for rehabilitation and strengthening.  The plan is for return home.                 MD signature                Date

## 2023-11-17 NOTE — Plan of Care (Signed)

## 2023-11-17 NOTE — Assessment & Plan Note (Signed)
 Continue home amlodipine

## 2023-11-17 NOTE — Evaluation (Signed)
 Occupational Therapy Evaluation Patient Details Name: Gabriella Farrell MRN: [id number] DOB: Oct 17, 1955 Today's Date: 11/17/2023   History of Present Illness   68 y.o. female presents to [facility] on 11/14/23 after sustaining a fall at home. L hip x-ray showed acute intricate Tarik fracture of the left proximal femur. S/p fixation, fracture, intertrochanteric, with intramedullary rod 10/12.  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     Clinical Impressions PTA Pt required only occasional, light support from spouse for BADL tasks due to recovery from reverse total shoulder arthroplasty (left) in 04/2023. Pt did not utilize any DME for functional transfers. Pt lives with her spouse in a multi-level home with at least 3 stairs to enter. Main bedroom and bathroom on second level of home. Pt reports her spouse is independent but has knee and back issues. Pt is currently needing Mod A for functional transfers and up to Max A for ADL engagement. Pt is primarily limited by decreased safety awareness, decreased activity tolerance and endurance, pain, decreased safe ADL participation, and residual L shoulder limitations. OT to follow Pt acutely to facilitate progress towards goals. Patient will benefit from continued inpatient follow up therapy, <3 hours/day.  If pt refuses further rehab, recommend 24/7 supervision at home to support her husband and HHOT.       If plan is discharge home, recommend the following:   A lot of help with walking and/or transfers;A lot of help with bathing/dressing/bathroom;Assistance with cooking/housework;Assist for transportation;Help with stairs or ramp for entrance     Functional Status Assessment   Patient has had a recent decline in their functional status and demonstrates the ability to make significant improvements in function in a  reasonable and predictable amount of time.     Equipment Recommendations   Other (comment);BSC/3in1 (rolling walker)     Recommendations for Other Services         Precautions/Restrictions   Precautions Precautions: Fall Recall of Precautions/Restrictions: Intact Restrictions LLE Weight Bearing Per Provider Order: Weight bearing as tolerated     Mobility Bed Mobility Overal bed mobility: Needs Assistance Bed Mobility: Supine to Sit     Supine to sit: Max assist     General bed mobility comments: Max A to LLE to come to EOB. Pt was able to manage trunk from elevated HOB but unable to scoot L hip to EOB    Transfers Overall transfer level: Needs assistance Equipment used: Rolling walker (2 wheels) Transfers: Sit to/from Stand Sit to Stand: Mod assist           General transfer comment: Pt required Mod A plus chair follow to take steps forward. Pt noted to have light buckling of R knee during ambulation and fatigued quickly. Pt required dense vebal cues for sequencing steps. Mod A for SPT to [facility] with +2 from behind for safety. Pt with difficulty weight bearing on LLE.      Balance Overall balance assessment: Needs assistance Sitting-balance support: Single extremity supported, Feet supported Sitting balance-Leahy Scale: Poor Sitting balance - Comments: Pt demonstrated R lateral lean while seated on BSC. Postural control: Right lateral lean Standing balance support: Bilateral upper extremity supported, During functional activity, Reliant on assistive device for balance Standing balance-Leahy Scale: Poor Standing balance comment: dependent on RW  ADL either performed or assessed with clinical judgement   ADL Overall ADL's : Needs assistance/impaired Eating/Feeding: Set up;Sitting   Grooming: Set up;Sitting   Upper Body Bathing: Minimal assistance;Sitting   Lower Body Bathing: Sitting/lateral leans;Moderate  assistance   Upper Body Dressing : Set up;Sitting   Lower Body Dressing: Maximal assistance;Sitting/lateral leans   Toilet Transfer: Moderate assistance;Stand-pivot;BSC/3in1;Rolling walker (2 wheels)   Toileting- Clothing Manipulation and Hygiene: Maximal assistance;Sit to/from stand       Functional mobility during ADLs: Moderate assistance;Rolling walker (2 wheels) General ADL Comments: Pt required Max A peri care after toileting on BSC. Pt with tightness noted in BLE limiting LB ADL independence. L shoulder limitations impacting ADL engagement.     Vision   Vision Assessment?: No apparent visual deficits Additional Comments: Pt occassionally with eyes closed during session.     Perception         Praxis         Pertinent Vitals/Pain Pain Assessment Pain Assessment: 0-10 Pain Score: 6  Pain Location: L leg Pain Descriptors / Indicators: Discomfort, Grimacing, Guarding Pain Intervention(s): Monitored during session, Limited activity within patient's tolerance     Extremity/Trunk Assessment Upper Extremity Assessment Upper Extremity Assessment: Defer to OT evaluation LUE Deficits / Details: Pt continues to experience pain and decreased shoulder ROM following reverse total shoulder arthroplasty in March 2025.   Lower Extremity Assessment Lower Extremity Assessment: LLE deficits/detail;RLE deficits/detail;Generalized weakness RLE Deficits / Details: has had a past knee injury, lacks about 15 deg knee ext and knee buckling slightly with ambulation when fatigued. Hip flex 3+/5, knee ext 3+/5 RLE Sensation: WNL RLE Coordination: WNL LLE Deficits / Details: hip flex 1/5, knee ext 2/5, lacking 20 deg knee ext LLE Sensation: WNL LLE Coordination: decreased gross motor   Cervical / Trunk Assessment Cervical / Trunk Assessment: Normal   Communication Communication Communication: No apparent difficulties   Cognition Arousal: Lethargic Behavior During Therapy: WFL for  tasks assessed/performed Cognition: Difficult to assess Difficult to assess due to: Level of arousal           OT - Cognition Comments: Pt lethargic upon arrival and throughout session. Pt denied falls but sustained a fall leading to the current hospitalization. Pt with limited safety awareness and unable to generalize impact of current deficits to safety at home.                 Following commands: Intact       Cueing  General Comments   Cueing Techniques: Verbal cues;Gestural cues  VSS. Reviewed the need for assist for mobilty due to fall risk   Exercises     Shoulder Instructions      Home Living Family/patient expects to be discharged to:: Private residence Living Arrangements: Spouse/significant other Available Help at Discharge: Available 24 hours/day;Family;Friend(s) (Spouse home all the time, other family and friends can help occassionally.) Type of Home: House Home Access: Stairs to enter [employer] of Steps: 6 at front entrance, 3 to get in from garage (garage usual entry) Entrance Stairs-Rails: None Home Layout: Multi-level;Bed/bath upstairs     Bathroom Shower/Tub: Tub/shower unit;Walk-in shower   Bathroom Toilet: Standard     Home Equipment: Shower seat - built in;Hand held shower head;Hospital bed;Other (comment) (stair lift chair (broken))   Additional Comments: Pt states that her friends have set up a hospital bed in the living room on the main level. Pt sttaes that she would be taking a sponge bath at home and if feeling stronger  go upstairs to walk in shower. Pt states that husband has some back issues and has a difficult time with stairs but genreally moves around okay.      Prior Functioning/Environment Prior Level of Function : Needs assist       Physical Assist : ADLs (physical)   ADLs (physical): IADLs Mobility Comments: Independent. ADLs Comments: Pt says that she will sometimes get help with BADLs if her shoulder is  bothering her. Denies falls.    OT Problem List: Decreased strength;Decreased range of motion;Decreased activity tolerance;Impaired balance (sitting and/or standing);Decreased safety awareness;Decreased knowledge of use of DME or AE;Impaired UE functional use;Pain   OT Treatment/Interventions: Self-care/ADL training;Therapeutic exercise;DME and/or AE instruction;Therapeutic activities;Balance training;Patient/family education      OT Goals(Current goals can be found in the care plan section)   Acute Rehab OT Goals Patient Stated Goal: to go home OT Goal Formulation: With patient Time For Goal Achievement: 12/01/23 Potential to Achieve Goals: Good ADL Goals Pt Will Perform Lower Body Bathing: with min assist;sitting/lateral leans Pt Will Perform Lower Body Dressing: with min assist;sitting/lateral leans Pt Will Transfer to Toilet: with contact guard assist;bedside commode;stand pivot transfer Additional ADL Goal #1: Pt will engage in bed mobility with Min A to manage LLE.   OT Frequency:  Min 2X/week    Co-evaluation PT/OT/SLP Co-Evaluation/Treatment: Yes Reason for Co-Treatment: For patient/therapist safety;To address functional/ADL transfers PT goals addressed during session: Mobility/safety with mobility;Balance;Proper use of DME;Strengthening/ROM OT goals addressed during session: ADL's and self-care;Strengthening/ROM      AM-PAC OT 6 Clicks Daily Activity     Outcome Measure Help from another person eating meals?: A Little Help from another person taking care of personal grooming?: A Little Help from another person toileting, which includes using toliet, bedpan, or urinal?: A Lot Help from another person bathing (including washing, rinsing, drying)?: A Lot Help from another person to put on and taking off regular upper body clothing?: A Little Help from another person to put on and taking off regular lower body clothing?: A Lot 6 Click Score: 15   End of Session  Equipment Utilized During Treatment: Gait belt;Rolling walker (2 wheels) Nurse Communication: Mobility status;Other (comment) (Therapy recs)  Activity Tolerance: Patient limited by fatigue;Patient limited by pain Patient left: in chair;with call bell/phone within reach;with chair alarm set;Other (comment) (MD present in room)  OT Visit Diagnosis: Unsteadiness on feet (R26.81);Muscle weakness (generalized) (M62.81);Pain Pain - Right/Left: Left Pain - part of body: Leg                Time: 9082-9041 OT Time Calculation (min): 41 min Charges:  OT General Charges $OT Visit: 1 Visit OT Evaluation $OT Eval Moderate Complexity: 1 Mod OT Treatments $Therapeutic Activity: 8-22 mins  Maurilio CROME, OTR/L.  [facility] Acute Rehabilitation  Office: [phone]   Maurilio PARAS Fallou Hulbert 11/17/2023, 11:23 AM

## 2023-11-17 NOTE — TOC Initial Note (Signed)
 Transition of Care [facility]) - Initial/Assessment Note    Patient Details  Name: Gabriella Farrell MRN: [id number] Date of Birth: Feb 19, 1955  Transition of Care [facility] CM/SW Contact:    Rosalva Jon Bloch, RN Phone Number: 11/17/2023, 10:34 AM  Clinical Narrative:                     - S/P IM rod left hip  From home with spouse. States d/c plan is home with home health services.  PT recommending SNF. Pt declined SNF placement, however, will confirm with husband for definitive next level of care. Pt states has DME @ home. Pt without RX med concern or transportation issues. If d/c to home with home health services.Pt without preference for provider as long as provider in net work with insurance.. Referral made with [facility] and accepted pending MD's order.  IP CM following and will assist with needs....  Expected Discharge Plan: Home w Home Health Services Barriers to Discharge: No Barriers Identified   Patient Goals and CMS Choice     Choice offered to / list presented to : Patient      Expected Discharge Plan and Services   Discharge Planning Services: CM Consult Post Acute Care Choice: Home Health Living arrangements for the past 2 months: Single Family Home                           HH Arranged: PT, OT HH Agency: [facility] Date [facility] Agency Contacted: 11/17/23 Time HH Agency Contacted: 1032 Representative spoke with at [facility] Agency: [place]  Prior Living Arrangements/Services Living arrangements for the past 2 months: Single Family Home Lives with:: Spouse Patient language and need for interpreter reviewed:: Yes Do you feel safe going back to the place where you live?: Yes      Need for Family Participation in Patient Care: Yes (Comment) Care giver support system in place?: Yes (comment) Current home services: DME (cane , walker, RW, BSC) Criminal Activity/Legal Involvement Pertinent to Current Situation/Hospitalization: No - Comment as needed  Activities of  Daily Living   ADL Screening (condition at time of admission) Independently performs ADLs?: Yes (appropriate for developmental age) Is the patient deaf or have difficulty hearing?: No Does the patient have difficulty seeing, even when wearing glasses/contacts?: No Does the patient have difficulty concentrating, remembering, or making decisions?: No  Permission Sought/Granted                  Emotional Assessment   Attitude/Demeanor/Rapport: Engaged Affect (typically observed): Accepting Orientation: : Oriented to  Time, Oriented to Situation, Oriented to Place, Oriented to Self Alcohol / Substance Use: Not Applicable Psych Involvement: No (comment)  Admission diagnosis:  Closed left hip fracture, initial encounter (HCC) [S72.002A] Fracture of proximal end of left femur (HCC) [S72.002A] Patient Active Problem List   Diagnosis Date Noted   Macrocytic anemia 11/15/2023   Alcohol abuse 11/14/2023   Alcohol-induced chronic pancreatitis (HCC) 11/14/2023   Alcoholic fatty liver 11/14/2023   Chronic pancreatitis (HCC) 11/14/2023   Diverticular disease of colon 11/14/2023   Essential hypertension 11/14/2023   Gastroesophageal reflux disease 11/14/2023   Hypercholesterolemia 11/14/2023   Lupus erythematosus 11/14/2023   Non-toxic uninodular goiter 11/14/2023   Pancreatic insufficiency 11/14/2023   Fracture of proximal end of left femur (HCC) 11/14/2023   S/P reverse total shoulder arthroplasty, left 04/30/2023   Centrilobular emphysema (HCC) 04/06/2023   Lung nodules 04/06/2023   Preoperative respiratory examination  04/06/2023   Tobacco use disorder 09/27/2018   Excessive daytime sleepiness 09/27/2018   Snoring 09/27/2018   Anxiety 12/04/2014   Depression 12/04/2014   PCP:  Katina Pfeiffer, PA-C Pharmacy:   [facility] [address] [place], [place] - [address] [place] [address] Phone: [phone] Fax: [phone]     Social  Drivers of Health (SDOH) Social History: SDOH Screenings   Food Insecurity: No Food Insecurity (11/14/2023)  Housing: Low Risk  (11/14/2023)  Transportation Needs: No Transportation Needs (11/14/2023)  Utilities: Not At Risk (11/14/2023)  Social Connections: Socially Integrated (11/14/2023)  Tobacco Use: High Risk (11/15/2023)   SDOH Interventions:     Readmission Risk Interventions     No data to display

## 2023-11-17 NOTE — [place] FL2 (Signed)
 [place]  MEDICAID [id number] LEVEL OF CARE FORM     IDENTIFICATION  Patient Name: Gabriella Farrell Birthdate: 1955-06-06 Sex: female Admission Date (Current Location): 11/14/2023  [facility] and [place] Number:  Producer, television/film/video and Address:         Provider Number: [id number]  Attending Physician Name and Address:  Caleen Qualia, MD  Relative Name and Phone Number:  Nakajima,Louis Spouse [phone]    Current Level of Care: Hospital Recommended Level of Care: Skilled Nursing Facility Prior Approval Number:    Date Approved/Denied:   PASRR Number:    Discharge Plan: Home    Current Diagnoses: Patient Active Problem List   Diagnosis Date Noted   Macrocytic anemia 11/15/2023   Alcohol abuse 11/14/2023   Alcohol-induced chronic pancreatitis (HCC) 11/14/2023   Alcoholic fatty liver 11/14/2023   Chronic pancreatitis (HCC) 11/14/2023   Diverticular disease of colon 11/14/2023   Essential hypertension 11/14/2023   Gastroesophageal reflux disease 11/14/2023   Hypercholesterolemia 11/14/2023   Lupus erythematosus 11/14/2023   Non-toxic uninodular goiter 11/14/2023   Pancreatic insufficiency 11/14/2023   Fracture of proximal end of left femur (HCC) 11/14/2023   S/P reverse total shoulder arthroplasty, left 04/30/2023   Centrilobular emphysema (HCC) 04/06/2023   Lung nodules 04/06/2023   Preoperative respiratory examination 04/06/2023   Tobacco use disorder 09/27/2018   Excessive daytime sleepiness 09/27/2018   Snoring 09/27/2018   Anxiety 12/04/2014   Depression 12/04/2014    Orientation RESPIRATION BLADDER Height & Weight     Self, Time, Situation  Normal Incontinent, External catheter Weight: 112 lb 7 oz (51 kg) Height:  5' 5.98 (167.6 cm)  BEHAVIORAL SYMPTOMS/MOOD NEUROLOGICAL BOWEL NUTRITION STATUS        Diet (see discharge summary)  AMBULATORY STATUS COMMUNICATION OF NEEDS Skin   Limited Assist Verbally Surgical wounds                        Personal Care Assistance Level of Assistance  Bathing, Feeding, Dressing Bathing Assistance: Limited assistance Feeding assistance: Limited assistance Dressing Assistance: Maximum assistance     Functional Limitations Info             SPECIAL CARE FACTORS FREQUENCY  PT (By licensed PT), OT (By licensed OT)     PT Frequency: 5x week OT Frequency: 5x week            Contractures Contractures Info: Not present    Additional Factors Info  Code Status, Allergies Code Status Info: full Allergies Info: bee venom           Current Medications (11/17/2023):  This is the current hospital active medication list Current Facility-Administered Medications  Medication Dose Route Frequency Provider Last Rate Last Admin   albuterol  (PROVENTIL ) (2.5 MG/3ML) 0.083% nebulizer solution 2.5 mg  2.5 mg Inhalation Q6H PRN Seena Marsa NOVAK, MD       ALPRAZolam  (XANAX ) tablet 2 mg  2 mg Oral Q6H PRN Melvin, Alexander B, MD   2 mg at 11/16/23 2139   amLODipine  (NORVASC ) tablet 5 mg  5 mg Oral Daily Melvin, Alexander B, MD   5 mg at 11/17/23 9155   atorvastatin  (LIPITOR) tablet 20 mg  20 mg Oral Daily Melvin, Alexander B, MD   20 mg at 11/17/23 0844   bisacodyl (DULCOLAX) EC tablet 5 mg  5 mg Oral Daily PRN Reyne Cordella SQUIBB, MD       docusate sodium (COLACE) capsule 100 mg  100 mg Oral BID Gebauer,  Cordella SQUIBB, MD   100 mg at 11/17/23 0844   enoxaparin (LOVENOX) injection 40 mg  40 mg Subcutaneous Q24H Reyne Cordella SQUIBB, MD   40 mg at 11/17/23 0845   famotidine  (PEPCID ) tablet 20 mg  20 mg Oral Daily PRN Seena Marsa NOVAK, MD       Fe Fum-Vit C-Vit B12-FA (TRIGELS-F FORTE) capsule 1 capsule  1 capsule Oral BID Amin, Sumayya, MD   1 capsule at 11/17/23 0844   fluticasone furoate-vilanterol (BREO ELLIPTA) 200-25 MCG/ACT 1 puff  1 puff Inhalation Daily Melvin, Alexander B, MD   1 puff at 11/17/23 0744   gabapentin  (NEURONTIN ) capsule 100 mg  100 mg Oral TID Melvin, Alexander B, MD   100  mg at 11/17/23 0844   HYDROcodone-acetaminophen  (NORCO/VICODIN) 5-325 MG per tablet 1 tablet  1 tablet Oral Q6H PRN Seena Marsa NOVAK, MD   1 tablet at 11/17/23 1410   HYDROmorphone  (DILAUDID ) injection 0.5 mg  0.5 mg Intravenous Q3H PRN Seena Marsa NOVAK, MD   0.5 mg at 11/16/23 1956   modafinil  (PROVIGIL ) tablet 100 mg  100 mg Oral Daily Melvin, Alexander B, MD   100 mg at 11/17/23 0845   ondansetron  (ZOFRAN ) tablet 4 mg  4 mg Oral Q6H PRN Reyne Cordella SQUIBB, MD       Or   ondansetron  (ZOFRAN ) injection 4 mg  4 mg Intravenous Q6H PRN Reyne Cordella SQUIBB, MD       pantoprazole  (PROTONIX ) EC tablet 40 mg  40 mg Oral Daily Melvin, Alexander B, MD   40 mg at 11/17/23 0844   polyethylene glycol (MIRALAX / GLYCOLAX) packet 17 g  17 g Oral Daily PRN Reyne Cordella SQUIBB, MD       sodium chloride  tablet 2 g  2 g Oral q AM Seena Marsa NOVAK, MD   2 g at 11/17/23 9542   And   sodium chloride  tablet 1 g  1 g Oral BID Melvin, Alexander B, MD   1 g at 11/17/23 1150   sodium phosphate  (FLEET) enema 1 enema  1 enema Rectal Once PRN Reyne Cordella SQUIBB, MD       umeclidinium bromide  (INCRUSE ELLIPTA ) 62.5 MCG/ACT 1 puff  1 puff Inhalation Daily Seena Marsa NOVAK, MD   1 puff at 11/17/23 0744   venlafaxine  XR (EFFEXOR -XR) 24 hr capsule 37.5 mg  37.5 mg Oral Q breakfast Melvin, Alexander B, MD   37.5 mg at 11/17/23 9155     Discharge Medications: Please see discharge summary for a list of discharge medications.  Relevant Imaging Results:  Relevant Lab Results:   Additional Information SSN: [id number]  Bridget Cordella Simmonds, LCSW

## 2023-11-17 NOTE — Evaluation (Signed)
 Physical Therapy Evaluation Patient Details Name: Gabriella Farrell MRN: [id number] DOB: [birth year] Today's Date: 11/17/2023  History of Present Illness  68 y.o. female presents to [facility] on 11/14/23 after sustaining a fall at home. L hip x-ray showed acute intricate Tarik fracture of the left proximal femur. S/p fixation, fracture, intertrochanteric, with intramedullary rod 10/12.  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  Clinical Impression  Pt admitted with above diagnosis. Pt from home with husband whom pt reports is independent but has back and knee issues. Pt currently needing max A for bed mobility and mod A for transfers and ambulation and is at very high fall risk. Patient will benefit from continued inpatient follow up therapy, <3 hours/day. If pt refuses further rehab, recommend 24/7 supervision at home to support her husband and HHPT.  Pt currently with functional limitations due to the deficits listed below (see PT Problem List). Pt will benefit from acute skilled PT to increase their independence and safety with mobility to allow discharge.           If plan is discharge home, recommend the following: A lot of help with walking and/or transfers;A little help with bathing/dressing/bathroom;Assistance with cooking/housework;Assist for transportation;Help with stairs or ramp for entrance   Can travel by private vehicle   Yes    Equipment Recommendations None recommended by PT  Recommendations for Other Services       Functional Status Assessment Patient has had a recent decline in their functional status and demonstrates the ability to make significant improvements in function in a reasonable and predictable amount of time.     Precautions / Restrictions Precautions Precautions: Fall Recall of Precautions/Restrictions:  Intact Restrictions Weight Bearing Restrictions Per Provider Order: Yes LLE Weight Bearing Per Provider Order: Weight bearing as tolerated      Mobility  Bed Mobility Overal bed mobility: Needs Assistance Bed Mobility: Supine to Sit     Supine to sit: Max assist     General bed mobility comments: max A to LLE to come to EOB. Pt able to manage trunk from elevated HOB but unable to scoot L hip to EOB    Transfers Overall transfer level: Needs assistance Equipment used: Rolling walker (2 wheels), None Transfers: Sit to/from Stand, Bed to chair/wheelchair/BSC Sit to Stand: Mod assist, +2 safety/equipment Stand pivot transfers: Mod assist, +2 safety/equipment         General transfer comment: mod A for SPT to [facility] with +2 from behind for safety. Pt having difficulty putting wt LLE. STS to RW from [facility] with mod A for power up    Ambulation/Gait Ambulation/Gait assistance: Min assist, +2 safety/equipment Gait Distance (Feet): 6 Feet Assistive device: Rolling walker (2 wheels) Gait Pattern/deviations: Step-to pattern, Knee flexed in stance - right, Knee flexed in stance - left Gait velocity: decreased Gait velocity interpretation: <1.31 ft/sec, indicative of household ambulator   General Gait Details: pt fatigued very quickly with RLE beginning to buckle after 6' and not accepting full wt on LLE. Chair brought behind for safety  Stairs            Wheelchair Mobility     Tilt Bed    Modified Rankin (Stroke Patients Only)       Balance Overall balance assessment: Needs assistance, History of Falls Sitting-balance support: Feet supported, Single extremity supported Sitting balance-Leahy Scale: Fair Sitting balance - Comments: R lean due  to L hip pain Postural control: Right lateral lean Standing balance support: Bilateral upper extremity supported, During functional activity, Reliant on assistive device for balance Standing balance-Leahy Scale: Poor Standing  balance comment: needs UE support as well as external assist at this point                             Pertinent Vitals/Pain Pain Assessment Pain Assessment: 0-10 Pain Score: 6  Pain Location: L leg Pain Descriptors / Indicators: Sore Pain Intervention(s): Limited activity within patient's tolerance, Monitored during session    Home Living Family/patient expects to be discharged to:: Private residence Living Arrangements: Spouse/significant other Available Help at Discharge: Available 24 hours/day;Family;Friend(s) (Spouse home all the time, other family and friends can help occassionally.) Type of Home: House Home Access: Stairs to enter Entrance Stairs-Rails: None Entrance Stairs-Number of Steps: 6 at front entrance, 3 to get in from garage (garage usual entry)   Home Layout: Multi-level;Bed/bath upstairs Home Equipment: Shower seat - built in;Hand held shower head;Hospital bed;Other (comment) (stair lift chair (broken)) Additional Comments: Pt states that her friends have set up a hospital bed in the living room on the main level. Pt sttaes that she would be taking a sponge bath at home and if feeling stronger go upstairs to walk in shower. Pt states that husband has some back issues and has a difficult time with stairs but genreally moves around okay.    Prior Function Prior Level of Function : Needs assist       Physical Assist : ADLs (physical)   ADLs (physical): IADLs Mobility Comments: Independent. ADLs Comments: Pt says that she will sometimes get help with BADLs if her shoulder is bothering her. Denies falls.     Extremity/Trunk Assessment   Upper Extremity Assessment Upper Extremity Assessment: Defer to OT evaluation LUE Deficits / Details: Pt continues to experience pain and decreased shoulder ROM following reverse total shoulder arthroplasty in March 2025.    Lower Extremity Assessment Lower Extremity Assessment: LLE deficits/detail;RLE  deficits/detail;Generalized weakness RLE Deficits / Details: has had a past knee injury, lacks about 15 deg knee ext and knee buckling slightly with ambulation when fatigued. Hip flex 3+/5, knee ext 3+/5 RLE Sensation: WNL RLE Coordination: WNL LLE Deficits / Details: hip flex 1/5, knee ext 2/5, lacking 20 deg knee ext LLE Sensation: WNL LLE Coordination: decreased gross motor    Cervical / Trunk Assessment Cervical / Trunk Assessment: Normal  Communication   Communication Communication: No apparent difficulties    Cognition Arousal: Alert Behavior During Therapy: WFL for tasks assessed/performed   PT - Cognitive impairments: Problem solving, Safety/Judgement                       PT - Cognition Comments: pt with decreased understanding of her current limitations and safety issues Following commands: Intact       Cueing Cueing Techniques: Verbal cues     General Comments General comments (skin integrity, edema, etc.): VSS. Reviewed the need for assist for mobilty due to fall risk    Exercises General Exercises - Lower Extremity Ankle Circles/Pumps: AROM, Both, 10 reps, Seated Quad Sets: AROM, Both, 10 reps, Seated Long Arc Quad: AROM, Left, 10 reps, Seated   Assessment/Plan    PT Assessment Patient needs continued PT services  PT Problem List Decreased strength;Decreased range of motion;Decreased activity tolerance;Decreased balance;Decreased mobility;Decreased coordination;Decreased knowledge of use of DME;Decreased safety awareness;Decreased knowledge of  precautions;Pain       PT Treatment Interventions DME instruction;Gait training;Stair training;Functional mobility training;Therapeutic activities;Therapeutic exercise;Balance training;Neuromuscular re-education;Cognitive remediation;Patient/family education    PT Goals (Current goals can be found in the Care Plan section)  Acute Rehab PT Goals Patient Stated Goal: pt wants to go home PT Goal Formulation:  With patient Time For Goal Achievement: 12/01/23 Potential to Achieve Goals: Good    Frequency Min 2X/week     Co-evaluation PT/OT/SLP Co-Evaluation/Treatment: Yes Reason for Co-Treatment: For patient/therapist safety;To address functional/ADL transfers PT goals addressed during session: Mobility/safety with mobility;Balance;Proper use of DME;Strengthening/ROM OT goals addressed during session: ADL's and self-care;Strengthening/ROM       AM-PAC PT 6 Clicks Mobility  Outcome Measure Help needed turning from your back to your side while in a flat bed without using bedrails?: A Lot Help needed moving from lying on your back to sitting on the side of a flat bed without using bedrails?: A Lot Help needed moving to and from a bed to a chair (including a wheelchair)?: A Lot Help needed standing up from a chair using your arms (e.g., wheelchair or bedside chair)?: A Lot Help needed to walk in hospital room?: A Lot Help needed climbing 3-5 steps with a railing? : Total 6 Click Score: 11    End of Session Equipment Utilized During Treatment: Gait belt Activity Tolerance: Patient tolerated treatment well Patient left: in chair;with call bell/phone within reach;with chair alarm set Nurse Communication: Mobility status PT Visit Diagnosis: Unsteadiness on feet (R26.81);History of falling (Z91.81);Difficulty in walking, not elsewhere classified (R26.2);Pain Pain - Right/Left: Left Pain - part of body: Hip    Time: 0923-0958 PT Time Calculation (min) (ACUTE ONLY): 35 min   Charges:   PT Evaluation $PT Eval Moderate Complexity: 1 Mod   PT General Charges $$ ACUTE PT VISIT: 1 Visit         Richerd Lipoma, PT  Acute Rehab Services Secure chat preferred Office [phone]   Richerd L Laparis Durrett 11/17/2023, 11:12 AM

## 2023-11-17 NOTE — Progress Notes (Signed)
 Orthopaedic Progress Note  S: Patient reports that she had a fair bit of pain in her hip yesterday she was able to work a little with therapy but not as much as she would have liked  O:  Vitals:   11/17/23 0512 11/17/23 0600  BP: 138/63   Pulse: 82   Resp: 15   Temp: 98.8 F (37.1 C)   SpO2: (!) 88% 93%    Minimal spotting on the bandages.  Thigh and calf are soft and compressible.  No tenderness in the calf.  Neurovascular intact  Imaging: X-rays of the knee show no fractures  Labs:  Results for orders placed or performed during the hospital encounter of 11/14/23 (from the past 24 hours)  CBC     Status: Abnormal   Collection Time: 11/17/23  4:39 AM  Result Value Ref Range   WBC 11.9 (H) 4.0 - 10.5 K/uL   RBC 2.28 (L) 3.87 - 5.11 MIL/uL   Hemoglobin 8.1 (L) 12.0 - 15.0 g/dL   HCT 77.0 (L) 63.9 - 53.9 %   MCV 100.4 (H) 80.0 - 100.0 fL   MCH 35.5 (H) 26.0 - 34.0 pg   MCHC 35.4 30.0 - 36.0 g/dL   RDW 87.5 88.4 - 84.4 %   Platelets 160 150 - 400 K/uL   nRBC 0.0 0.0 - 0.2 %    Assessment: Postop day 2 status post IM rod left hip  Patient is doing relatively well but limited by pain.  She had hoped to go home but it seems like that might be more appropriate for her to go to rehab.  She continue to weight-bear as tolerated.  Continue Lovenox for DVT prophylaxis.  Continue with her current pain and bowel regimen.  She can follow-up with me in 2 weeks time upon discharge  Injuries: Left intertrochanteric hip fracture  Weightbearing: Weightbearing as tolerated  Insicional and dressing care: As needed   Pain management: Continue current pain regimen  VTE prophylaxis: Lovenox    Follow - up plan: 2 weeks   Cordella Rhein, MD, MS [facility] [phone]

## 2023-11-18 DIAGNOSIS — S72002A Fracture of unspecified part of neck of left femur, initial encounter for closed fracture: Secondary | ICD-10-CM | POA: Diagnosis not present

## 2023-11-18 LAB — HEMOGLOBIN AND HEMATOCRIT, BLOOD
HCT: 23.4 % — ABNORMAL LOW (ref 36.0–46.0)
Hemoglobin: 8.1 g/dL — ABNORMAL LOW (ref 12.0–15.0)

## 2023-11-18 MED ORDER — ALPRAZOLAM 0.5 MG PO TABS
1.0000 mg | ORAL_TABLET | Freq: Four times a day (QID) | ORAL | Status: DC | PRN
  Administered 2023-11-18: 1 mg via ORAL
  Filled 2023-11-18 (×2): qty 2

## 2023-11-18 MED ORDER — HYDROCODONE-ACETAMINOPHEN 5-325 MG PO TABS
1.0000 | ORAL_TABLET | ORAL | Status: DC | PRN
  Administered 2023-11-18 – 2023-11-19 (×4): 1 via ORAL
  Filled 2023-11-18 (×4): qty 1

## 2023-11-18 NOTE — Plan of Care (Signed)
   Problem: Education: Goal: Knowledge of General Education information will improve Description: Including pain rating scale, medication(s)/side effects and non-pharmacologic comfort measures Outcome: Progressing   Problem: Clinical Measurements: Goal: Ability to maintain clinical measurements within normal limits will improve Outcome: Progressing Goal: Will remain free from infection Outcome: Progressing

## 2023-11-18 NOTE — Progress Notes (Signed)
  Progress Note   Patient: Gabriella Farrell FMW:[id number] DOB: [birth year] DOA: 11/14/2023     4 DOS: the patient was seen and examined on 11/18/2023 at 10:22AM      Brief hospital course: 68 y.o. F with HTN, SLE no longer on disease modifying drug, emphysema, chronic pancreatic insufficiency, and NASH who presented with left hip fracture.     Assessment and Plan: *Hip fracture -PT OT - WBAT - Plan for aspirin for DVT prophylaxis at discharge  Macrocytic anemia, likely postop blood loss anemia Hemoglobin trending down.  Her left thigh is swollen, and I suspect she has some degree of hematoma, acute blood loss anemia postoperatively.  Hemoglobin appears to have stabilized in the last 24 hours - Trend CBC  Centrilobular emphysema (HCC) No evidence of flare - Continue home LAMA/LAMA  Chronic pancreatitis (HCC) Pancreatic insufficiency.  Does not use pancreas enzymes  Essential hypertension Hyperlipidemia Blood pressure controlled - Continue amlodipine , atorvastatin   Depression -Continue venlafaxine , armodafinil   Lupus erythematosus Not currently on Plaquenil or prednisone  Chronic hyponatremia - Continue salt tabs        Subjective: The left thigh is swollen.  No confusion, fever.  No cough, dyspnea, urinary irritative symptoms     Physical Exam: BP 135/76 (BP Location: Left Arm)   Pulse 92   Temp 99.1 F (37.3 C) (Oral)   Resp 17   Ht 5' 5.98 (1.676 m)   Wt 51 kg   SpO2 96%   BMI 18.16 kg/m   Thin adult female, lying in bed, interactive and appropriate RRR, no murmurs, no peripheral edema Respiratory normal, lungs clear without rales or wheezes Abdomen soft, no tenderness palpation Bilateral calfs are normal, the left thigh is enlarged relative to the right, without ecchymosis, there is mild tenderness to palpation Attention normal, affect normal, judgment and insight appear normal, upper extremity strength symmetric, speech fluent    Data  Reviewed: CBC shows hemoglobin stable at 8.1    Family Communication: Husband at the bedside    Disposition: Status is: Inpatient         Author: Lonni SHAUNNA Dalton, MD 11/18/2023 5:50 PM  For on call review [email or web address].

## 2023-11-18 NOTE — TOC Progression Note (Addendum)
 Transition of Care [facility]) - Progression Note    Patient Details  Name: Zakara Parkey MRN: [id number] Date of Birth: Jul 20, 1955  Transition of Care [employer]) CM/SW Contact  Bridget Cordella Simmonds, LCSW Phone Number: 11/18/2023, 11:52 AM  Clinical Narrative:   Bed offers provided to pt on medicare choice document.  She is asking for additional responses. CSW reached out to facilities that have not yet responded.   1345: one additional bed offer at [facility].  CSW spoke with pt, husband now here, discussed options.  They are not comfortable with pt going to low rated facility and would like to DC home.  They are set up at home with hospital bed, 3n1, walker all on one level.  Husband reports he is able to physically assist as needed.    Team updated.   Expected Discharge Plan: Home w Home Health Services Barriers to Discharge: No Barriers Identified               Expected Discharge Plan and Services   Discharge Planning Services: CM Consult Post Acute Care Choice: Home Health Living arrangements for the past 2 months: Single Family Home                           HH Arranged: PT, OT HH Agency: [facility] Date [facility] Agency Contacted: 11/17/23 Time HH Agency Contacted: 1032 Representative spoke with at [facility] Agency: [place]   Social Drivers of Health (SDOH) Interventions SDOH Screenings   Food Insecurity: No Food Insecurity (11/14/2023)  Housing: Low Risk  (11/14/2023)  Transportation Needs: No Transportation Needs (11/14/2023)  Utilities: Not At Risk (11/14/2023)  Social Connections: Socially Integrated (11/14/2023)  Tobacco Use: High Risk (11/15/2023)    Readmission Risk Interventions     No data to display

## 2023-11-18 NOTE — Progress Notes (Signed)
 Physical Therapy Treatment Patient Details Name: Gabriella Farrell MRN: [id number] DOB: November 11, 1955 Today's Date: 11/18/2023   History of Present Illness 68 y.o. female presents to [facility] on 11/14/23 after sustaining a fall at home. L hip x-ray showed acute intricate Tarik fracture of the left proximal femur. S/p fixation, fracture, intertrochanteric, with intramedullary rod 10/12.  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    PT Comments  Pt resting in bed on arrival, pleasant and agreeable to session with good progress towards acute goals. Pt performing supine<>sit with min A initially however gait belt issued and pt able to self mobilize LLE in/out of bed with CGA for safety. Pt demonstrating sit<>stand transfers with CGA for safety with good recall for hand placement with cues for placing LLE anterior prior to coming to sit. Pt progressing gait with CGA for safety with RW for support with no noted RLE buckling. Pt requiring heavy mod A to ascend/descend 2 steps in stairwell without rail support to simulate home entry. Pt spouse present and supportive. Pt continues to be limited in safe mobility by decreased activity tolerance, poor balance/postural reactions and weakness and will benefit from continued inpatient follow up therapy, <3 hours/day. Will continue to follow acutely.    If plan is discharge home, recommend the following: A lot of help with walking and/or transfers;A little help with bathing/dressing/bathroom;Assistance with cooking/housework;Assist for transportation;Help with stairs or ramp for entrance   Can travel by private vehicle     Yes  Equipment Recommendations  None recommended by PT    Recommendations for Other Services       Precautions / Restrictions Precautions Precautions: Fall Recall of Precautions/Restrictions:  Intact Restrictions Weight Bearing Restrictions Per Provider Order: Yes LLE Weight Bearing Per Provider Order: Weight bearing as tolerated     Mobility  Bed Mobility Overal bed mobility: Needs Assistance Bed Mobility: Supine to Sit, Sit to Supine     Supine to sit: Contact guard Sit to supine: Min assist, Contact guard assist   General bed mobility comments: CGA for safety to exit R side of bed, min A to manage LLE back ino bed at end of session, issued gait belt and pt able to demonstrate ability to bring LLE in/out of bed with CGA    Transfers Overall transfer level: Needs assistance Equipment used: Rolling walker (2 wheels), None Transfers: Sit to/from Stand, Bed to chair/wheelchair/BSC Sit to Stand: Contact guard assist Stand pivot transfers: Contact guard assist         General transfer comment: CGA for safety from EOB at lowest height, BSC and recliner x2    Ambulation/Gait Ambulation/Gait assistance: +2 safety/equipment, Contact guard assist Gait Distance (Feet): 15 Feet (+ 45') Assistive device: Rolling walker (2 wheels) Gait Pattern/deviations: Step-to pattern, Knee flexed in stance - right, Knee flexed in stance - left Gait velocity: decreased     General Gait Details: no buckling noted, CGA for safety. distance limited by fatigue   Stairs Stairs: Yes Stairs assistance: Mod assist Stair Management: No rails, Step to pattern, Forwards Number of Stairs: 2 General stair comments: pt needing mod A to maintain balance to ascend and descend 2 steps without rail support to simulate home entry, pt spouse present and demonstrating ability to assist pt as needed   Wheelchair Mobility     Tilt Bed    Modified Rankin (Stroke Patients Only)  Balance Overall balance assessment: Needs assistance, History of Falls Sitting-balance support: Feet supported, Single extremity supported Sitting balance-Leahy Scale: Good     Standing balance support:  Bilateral upper extremity supported, During functional activity, Reliant on assistive device for balance Standing balance-Leahy Scale: Poor Standing balance comment: needs UE support                            Communication Communication Communication: No apparent difficulties  Cognition Arousal: Alert Behavior During Therapy: WFL for tasks assessed/performed   PT - Cognitive impairments: Problem solving, Safety/Judgement                       PT - Cognition Comments: pt with decreased understanding of her current limitations and safety issues Following commands: Intact      Cueing Cueing Techniques: Verbal cues  Exercises General Exercises - Lower Extremity Ankle Circles/Pumps: AROM, Both, 10 reps, Seated Quad Sets: AROM, Both, 10 reps, Seated Heel Slides: AROM, Left, 5 reps    General Comments        Pertinent Vitals/Pain Pain Assessment Pain Assessment: Faces Faces Pain Scale: Hurts little more Pain Location: L leg Pain Descriptors / Indicators: Sore Pain Intervention(s): Monitored during session, Limited activity within patient's tolerance    Home Living                          Prior Function            PT Goals (current goals can now be found in the care plan section) Acute Rehab PT Goals Patient Stated Goal: pt wants to go home PT Goal Formulation: With patient Time For Goal Achievement: 12/01/23 Progress towards PT goals: Progressing toward goals    Frequency    Min 2X/week      PT Plan      Co-evaluation              AM-PAC PT 6 Clicks Mobility   Outcome Measure  Help needed turning from your back to your side while in a flat bed without using bedrails?: A Little Help needed moving from lying on your back to sitting on the side of a flat bed without using bedrails?: A Little Help needed moving to and from a bed to a chair (including a wheelchair)?: A Little Help needed standing up from a chair using  your arms (e.g., wheelchair or bedside chair)?: A Little Help needed to walk in hospital room?: A Little Help needed climbing 3-5 steps with a railing? : Total 6 Click Score: 16    End of Session Equipment Utilized During Treatment: Gait belt Activity Tolerance: Patient tolerated treatment well Patient left: with call bell/phone within reach;in bed;with family/visitor present Nurse Communication: Mobility status PT Visit Diagnosis: Unsteadiness on feet (R26.81);History of falling (Z91.81);Difficulty in walking, not elsewhere classified (R26.2);Pain Pain - Right/Left: Left Pain - part of body: Hip     Time: 8581-8552 PT Time Calculation (min) (ACUTE ONLY): 29 min  Charges:    $Gait Training: 23-37 mins PT General Charges $$ ACUTE PT VISIT: 1 Visit                     Blair Lundeen R. PTA Acute Rehabilitation Services Office: [phone]   Therisa CHRISTELLA Boor 11/18/2023, 3:56 PM

## 2023-11-19 ENCOUNTER — Inpatient Hospital Stay (HOSPITAL_COMMUNITY)

## 2023-11-19 ENCOUNTER — Other Ambulatory Visit (HOSPITAL_COMMUNITY): Payer: Self-pay

## 2023-11-19 DIAGNOSIS — S72002A Fracture of unspecified part of neck of left femur, initial encounter for closed fracture: Secondary | ICD-10-CM | POA: Diagnosis not present

## 2023-11-19 LAB — BASIC METABOLIC PANEL WITH GFR
Anion gap: 11 (ref 5–15)
BUN: 8 mg/dL (ref 8–23)
CO2: 22 mmol/L (ref 22–32)
Calcium: 7.8 mg/dL — ABNORMAL LOW (ref 8.9–10.3)
Chloride: 101 mmol/L (ref 98–111)
Creatinine, Ser: 0.49 mg/dL (ref 0.44–1.00)
GFR, Estimated: >60 mL/min (ref >60)
Glucose, Bld: 86 mg/dL (ref 70–99)
Potassium: 3.2 mmol/L — ABNORMAL LOW (ref 3.5–5.1)
Sodium: 134 mmol/L — ABNORMAL LOW (ref 135–145)

## 2023-11-19 LAB — CBC
HCT: 23.6 % — ABNORMAL LOW (ref 36.0–46.0)
Hemoglobin: 8.2 g/dL — ABNORMAL LOW (ref 12.0–15.0)
MCH: 35.7 pg — ABNORMAL HIGH (ref 26.0–34.0)
MCHC: 34.7 g/dL (ref 30.0–36.0)
MCV: 102.6 fL — ABNORMAL HIGH (ref 80.0–100.0)
Platelets: 225 K/uL (ref 150–400)
RBC: 2.3 MIL/uL — ABNORMAL LOW (ref 3.87–5.11)
RDW: 12.8 % (ref 11.5–15.5)
WBC: 6.8 K/uL (ref 4.0–10.5)
nRBC: 0 % (ref 0.0–0.2)

## 2023-11-19 MED ORDER — POTASSIUM CHLORIDE 20 MEQ PO PACK
40.0000 meq | PACK | Freq: Once | ORAL | Status: AC
  Administered 2023-11-19: 40 meq via ORAL
  Filled 2023-11-19: qty 2

## 2023-11-19 MED ORDER — HYDROCODONE-ACETAMINOPHEN 5-325 MG PO TABS
1.0000 | ORAL_TABLET | ORAL | 0 refills | Status: AC | PRN
  Filled 2023-11-19: qty 30, 5d supply, fill #0

## 2023-11-19 MED ORDER — ASPIRIN 81 MG PO TBEC: 81.0000 mg | DELAYED_RELEASE_TABLET | Freq: Two times a day (BID) | ORAL | Status: AC

## 2023-11-19 NOTE — Discharge Summary (Signed)
 Physician Discharge Summary   Patient: Gabriella Farrell MRN: [id number] DOB: 1956-01-24  Admit date:     11/14/2023  Discharge date: 11/19/23  Discharge Physician: Lonni SHAUNNA Dalton   PCP: Katina Pfeiffer, PA-C     Recommendations at discharge:  Follow up with Orthopedics Dr. Reyne in 1 week for hip fracture Follow up with PCP Pfeiffer Katina when able  Pfeiffer Katina: Please obtain CBC in 2-4 weeks If macrocytosis persists, recommend referral to Hematology     Discharge Diagnoses: Principal Problem:   Fracture of proximal end of left femur (HCC) Active Problems:   Macrocytic anemia   Alcohol abuse   Centrilobular emphysema (HCC)   Chronic pancreatitis (HCC)   Pancreatic insufficiency   Essential hypertension   Depression   Anxiety   Lupus erythematosus   Gastroesophageal reflux disease      Hospital Course: 68 y.o. F with HTN, SLE no longer on disease modifying drug, emphysema, chronic pancreatic insufficiency, and NASH who presented with left hip fracture.    *  Left hip fracture  Admitted and ortho consulted.  Dr. Reyne performed IM nail of left hip on 10/12.  Post-surgical course complicated by anemia not requiring transfusion.  Performed well with PT, and able to progress to discharge home with [facility].  Equipment ordered.  Follow up with Ortho arranged.  Plan for aspirin for DVT prophylaxis at discharge   Macrocytic anemia, likely postop blood loss anemia Hemoglobin trending down.  Her left thigh is swollen, and I suspect she has some degree of hematoma, acute blood loss anemia postoperatively.   Hemoglobin stabilized in last 48 hours.    Centrilobular emphysema (HCC) No evidence of flare here.     Chronic pancreatitis (HCC) Pancreatic insufficiency.  Does not use pancreas enzymes   Essential hypertension Hyperlipidemia Blood pressure controlled on home meds.   Depression Stable on venlafaxine , armodafinil    Lupus erythematosus Not  currently on Plaquenil or prednisone   Chronic hyponatremia On salt tabs            The [place]  Controlled Substances Registry was reviewed for this patient prior to discharge.  Consultants: Orthopedics Dr. Reyne Procedures performed: Left hip intramedullary nail  Disposition: Home health Diet recommendation:  Discharge Diet Orders (From admission, onward)     Start     Ordered   11/19/23 0000  Diet - low sodium heart healthy        11/19/23 1352             DISCHARGE MEDICATION: Allergies as of 11/19/2023       Reactions   Bee Venom Anaphylaxis        Medication List     PAUSE taking these medications    buPROPion 150 MG 24 hr tablet Wait to take this until your doctor or other care provider tells you to start again. Commonly known as: WELLBUTRIN XL Take 150 mg by mouth every morning.       TAKE these medications    albuterol  108 (90 Base) MCG/ACT inhaler Commonly known as: VENTOLIN  HFA Inhale 2 puffs into the lungs every 6 (six) hours as needed for wheezing or shortness of breath.   ALPRAZolam  0.5 MG tablet Commonly known as: XANAX  Take 2 mg by mouth every 6 (six) hours as needed for anxiety.   amLODipine  5 MG tablet Commonly known as: NORVASC  Take 1 tablet by mouth once daily   Armodafinil  200 MG Tabs Take 100 mg by mouth daily.   aspirin EC 81  MG tablet Take 1 tablet (81 mg total) by mouth in the morning and at bedtime. Swallow whole.   atorvastatin  20 MG tablet Commonly known as: LIPITOR Take 1 tablet by mouth once daily   budesonide -formoterol  160-4.5 MCG/ACT inhaler Commonly known as: Symbicort  Inhale 2 puffs into the lungs 2 times daily at 12 noon and 4 pm.   CALCIUM  600 + D PO Take 1 tablet by mouth daily. With vitamin c   cyclobenzaprine  10 MG tablet Commonly known as: FLEXERIL  Take 1 tablet (10 mg total) by mouth 3 (three) times daily as needed for muscle spasms.   famotidine  20 MG tablet Commonly known as:  PEPCID  Take 20 mg by mouth daily as needed for heartburn or indigestion.   fluocinonide gel 0.05 % Commonly known as: LIDEX Apply 1 Application topically 2 (two) times daily as needed (Scalp).   gabapentin  100 MG capsule Commonly known as: NEURONTIN  Take 100 mg by mouth 3 (three) times daily.   HYDROcodone-acetaminophen  5-325 MG tablet Commonly known as: NORCO/VICODIN Take 1 tablet by mouth every 4 (four) hours as needed for moderate pain (pain score 4-6) or severe pain (pain score 7-10).   Incruse Ellipta  62.5 MCG/ACT Aepb Generic drug: umeclidinium bromide  Inhale 1 puff into the lungs daily.   ipratropium 0.03 % nasal spray Commonly known as: ATROVENT Place 2 sprays into both nostrils 2 (two) times daily as needed for rhinitis.   multivitamin capsule Take 1 capsule by mouth daily.   naproxen  500 MG tablet Commonly known as: Naprosyn  Take 1 tablet (500 mg total) by mouth 2 (two) times daily with a meal. What changed:  when to take this reasons to take this   ondansetron  4 MG tablet Commonly known as: Zofran  Take 1 tablet (4 mg total) by mouth every 8 (eight) hours as needed for nausea or vomiting.   pantoprazole  40 MG tablet Commonly known as: PROTONIX  Take 40 mg by mouth daily.   Pristiq 25 MG 24 hr tablet Generic drug: desvenlafaxine Take 25 mg by mouth daily.   sodium chloride  1 g tablet Take 1-2 g by mouth See admin instructions. Take 2 g in the morning, 1 g in the afternoon   VITA-C PO Take 1 tablet by mouth daily.   Vitamin D 50 MCG (2000 UT) Caps Take 2,000 Units by mouth daily.   zinc gluconate 50 MG tablet Take 50 mg by mouth daily.               Durable Medical Equipment  (From admission, onward)           Start     Ordered   11/19/23 1349  DME Walker  Once       Question Answer Comment  Walker: With 5 Inch Wheels   Patient needs a walker to treat with the following condition Hip fracture (HCC)      11/19/23 1352               Discharge Care Instructions  (From admission, onward)           Start     Ordered   11/19/23 0000  Discharge wound care:       Comments: As directed by orthopedics   11/19/23 1352            Follow-up Information     Katina Pfeiffer, PA-C Follow up.   Specialty: Family Medicine Contact information: [address] [place] [address] [phone]  [facility] Follow up.   Specialty: Home Health Services Why: Home health services will be provided by [facility] , start of care within 48 hours post discharge Contact information: [address] [place] [address] [phone]         Reyne Cordella SQUIBB, MD Follow up.   Specialty: Orthopedic Surgery Contact information: [address] [place] [address] [phone]                 Discharge Instructions     Diet - low sodium heart healthy   Complete by: As directed    Discharge instructions   Complete by: As directed    **IMPORTANT DISCHARGE INSTRUCTIONS**   From Dr. Jonel: You were admitted for a hip fracture  You had a surgical hip fixation by Dr. Reyne from [facility]  You should call his office to confirm your follow up appointment with them  Follow all wound care instructions they gave  For one month, take aspirin 81 mg twice daily for preventing blood clots related to the fracture  For pain, take acetaminophen  up to 1000 mg (two tabs) three times daily  If severe pain, you may take hydrocodone-acetaminophen  5-325 mg as directed  If you take hydrocodone, take Miralax, Senna or both to prevent constipation  Do not take more than 4,000 mg of acetaminophen /Tylenol  in a day (combined acetaminophen  and Vicodin)  Resume your home medicines  Go see your primary doctor and have them check your blood level in 1-2 weeks and follow as appropriate   Discharge wound care:   Complete by: As directed    As  directed by orthopedics   Increase activity slowly   Complete by: As directed    No wound care   Complete by: As directed        Discharge Exam: Filed Weights   11/14/23 2124 11/15/23 1012  Weight: 51 kg 51 kg    General: Pt is alert, awake, not in acute distress Cardiovascular: RRR, nl S1-S2, no murmurs appreciated.   No LE edema.   Respiratory: Normal respiratory rate and rhythm.  CTAB without rales or wheezes. Abdominal: Abdomen soft and non-tender.  No distension or HSM.   Neuro/Psych: Strength symmetric in upper and lower extremities.  Judgment and insight appear normal.   Condition at discharge: good  The results of significant diagnostics from this hospitalization (including imaging, microbiology, ancillary and laboratory) are listed below for reference.   Imaging Studies: DG Chest 2 View Result Date: 11/19/2023 CLINICAL DATA:  Cough. EXAM: CHEST - 2 VIEW COMPARISON:  Chest radiograph dated 04/30/2023. FINDINGS: No focal consolidation, pleural effusion, or pneumothorax. The cardiac silhouette is within limits. No acute osseous pathology. Left shoulder arthroplasty. IMPRESSION: No active cardiopulmonary disease. Electronically Signed   By: Vanetta Chou M.D.   On: 11/19/2023 13:34   DG Knee 1-2 Views Left Result Date: 11/16/2023 CLINICAL DATA:  Left knee pain after left hip surgery. EXAM: LEFT KNEE - 1-2 VIEW COMPARISON:  None Available. FINDINGS: The bones are subjectively under mineralized. No evidence of fracture, dislocation, or joint effusion. Alignment and joint spaces are preserved. No erosions, osteophytes or focal bone abnormality. Soft tissues are unremarkable. IMPRESSION: No acute findings or explanation for pain. Electronically Signed   By: Andrea Gasman M.D.   On: 11/16/2023 16:05   DG HIP UNILAT WITH PELVIS 2-3 VIEWS LEFT Result Date: 11/15/2023 CLINICAL DATA:  Fixation of left intertrochanteric femur fracture EXAM: DG HIP (  WITH OR WITHOUT PELVIS) 2V  LEFT COMPARISON:  Left hip radiograph dated 11/14/2023 FINDINGS: Two fluoroscopic images obtained during fixation of left intertrochanteric femur fracture. 38 seconds fluoro time utilized. Radiation dose 2.56 mGy Kerma. Please see performing physicians operative report for full details. IMPRESSION: Fluoroscopic images were obtained for intraoperative guidance of fixation of left intertrochanteric femur fracture. Electronically Signed   By: Limin  Xu M.D.   On: 11/15/2023 12:13   DG C-Arm 1-60 Min-No Report Result Date: 11/15/2023 Fluoroscopy was utilized by the requesting physician.  No radiographic interpretation.   DG Hip Unilat With Pelvis 2-3 Views Left Result Date: 11/14/2023 CLINICAL DATA:  Status post fall. EXAM: DG HIP (WITH OR WITHOUT PELVIS) 2-3V LEFT COMPARISON:  None Available. FINDINGS: An acute fracture deformity is seen extending through the inter trochanteric region of the proximal left femur. There is no evidence of dislocation. There is no evidence of significant arthropathy or other focal bone abnormality. IMPRESSION: Acute intertrochanteric fracture of the proximal left femur. Electronically Signed   By: Suzen Dials M.D.   On: 11/14/2023 11:40   CT Chest W Contrast Result Date: 11/11/2023 CLINICAL DATA:  Pulmonary nodules. EXAM: CT CHEST WITH CONTRAST TECHNIQUE: Multidetector CT imaging of the chest was performed during intravenous contrast administration. RADIATION DOSE REDUCTION: This exam was performed according to the departmental dose-optimization program which includes automated exposure control, adjustment of the mA and/or kV according to patient size and/or use of iterative reconstruction technique. CONTRAST:  80mL OMNIPAQUE  IOHEXOL  300 MG/ML  SOLN COMPARISON:  05/07/2023 and 08/16/2021. FINDINGS: Cardiovascular: Atherosclerotic calcification of the aorta and coronary arteries. Enlarged pulmonic trunk. Heart size normal. No pericardial effusion. Mediastinum/Nodes: No  pathologically enlarged mediastinal, hilar or axillary lymph nodes. Esophagus is grossly unremarkable. Lungs/Pleura: Centrilobular emphysema. 8 mm lingular nodule (image 76), unchanged from 08/16/2021. Previously seen areas of amorphous peribronchovascular ground-glass have resolved in the interval, compatible with an infectious/inflammatory etiology. No pleural fluid. Airway is unremarkable. Upper Abdomen: Chronic pneumobilia. Vague low-attenuation along the gallbladder fossa (image 132), possibly focal fat. Small right adrenal adenoma. No specific follow-up necessary. Visualized portions of the liver, adrenal glands, kidneys, spleen, pancreas, stomach and bowel are otherwise grossly unremarkable. No upper abdominal adenopathy. Musculoskeletal: Degenerative changes in the spine. Left shoulder arthroplasty. IMPRESSION: 1. Interval clearing of previously seen areas of amorphous peribronchovascular ground-glass nodularity, compatible with an infectious/inflammatory etiology. 2. No suspicious pulmonary nodules. Recommend annual low-dose lung cancer screening CT. Low-dose CT lung cancer screening is recommended for patients who are 50-47 years of age with a 20+ pack-year history of smoking and who are currently smoking or quit <=15 years ago. 3. Small right adrenal adenoma. 4. Aortic atherosclerosis (ICD10-I70.0). Coronary artery calcification. 5. Enlarged pulmonic trunk, indicative of pulmonary arterial hypertension. 6.  Emphysema (ICD10-J43.9). Electronically Signed   By: Newell Eke M.D.   On: 11/11/2023 09:44    Microbiology: Results for orders placed or performed during the hospital encounter of 11/14/23  Surgical pcr screen     Status: None   Collection Time: 11/14/23  4:23 PM   Specimen: Nasal Mucosa; Nasal Swab  Result Value Ref Range Status   MRSA, PCR NEGATIVE NEGATIVE Final   Staphylococcus aureus NEGATIVE NEGATIVE Final    Comment: (NOTE) The Xpert SA Assay (FDA approved for NASAL specimens  in patients 55 years of age and older), is one component of a comprehensive surveillance program. It is not intended to diagnose infection nor to guide or monitor treatment. Performed at [facility],  [address] [place], [place] [address]     Labs: CBC: Recent Labs  Lab 11/14/23 1218 11/15/23 0222 11/16/23 0615 11/17/23 0439 11/18/23 1040 11/19/23 0417  WBC 9.8 7.1 13.0* 11.9*  --  6.8  NEUTROABS 7.4  --   --   --   --   --   HGB 10.9* 9.3* 8.9* 8.1* 8.1* 8.2*  HCT 31.1* 27.2* 25.9* 22.9* 23.4* 23.6*  MCV 100.0 102.3* 102.4* 100.4*  --  102.6*  PLT 195 154 168 160  --  225   Basic Metabolic Panel: Recent Labs  Lab 11/14/23 1218 11/15/23 0222 11/16/23 0615 11/19/23 0417  NA 130* 136 133* 134*  K 4.5 4.1 3.8 3.2*  CL 96* 105 99 101  CO2 23 24 23 22   GLUCOSE 94 108* 144* 86  BUN 9 6* 6* 8  CREATININE 0.77 0.76 0.70 0.49  CALCIUM  7.8* 7.4* 8.0* 7.8*   Liver Function Tests: Recent Labs  Lab 11/15/23 0222  ALBUMIN 2.9*   CBG: No results for input(s): GLUCAP in the last 168 hours.  Discharge time spent: approximately 45 minutes spent on discharge counseling, evaluation of patient on day of discharge, and coordination of discharge planning with nursing, social work, pharmacy and case management  Signed: Lonni SHAUNNA Dalton, MD [facility] 11/19/2023

## 2023-11-19 NOTE — Progress Notes (Signed)
 OT Cancellation Note  Patient Details Name: Gabriella Farrell MRN: [id number] DOB: 1955-11-23   Cancelled Treatment:    Reason Eval/Treat Not Completed: Other (comment) Pt prepared to go home with spouse. Stated there were no further needs to be addressed at this time.   Maurilio CROME, OTR/LSABRA  [facility] Acute Rehabilitation  Office: [phone]   Maurilio PARAS Kayleen Alig 11/19/2023, 3:41 PM

## 2023-11-19 NOTE — TOC Transition Note (Signed)
 Transition of Care [facility]) - Discharge Note   Patient Details  Name: Gabriella Farrell MRN: [id number] Date of Birth: 04/21/1955  [facility]) CM/SW Contact:  Rosalva Jon Bloch, RN Phone Number: 11/19/2023, 2:12 PM   Clinical Narrative:    Patient will DC to: Home Anticipated DC date: 11/19/2023 Family notified: yes Transport by:car  Per MD patient ready for DC today. RN, patient, patient's husband and [facility] notified of DC. Referral for DME : RW  made with [employer] after pt without a provider choice. Post hospital f/u noted on AVS. Pt without RX med concerns  Husband to provide transportation to home.  RNCM will sign off for now as intervention is no longer needed. Please consult us  again if new needs arise.    Final next level of care: Home w Home Health Services Barriers to Discharge: No Barriers Identified   Patient Goals and CMS Choice     Choice offered to / list presented to : Patient      Discharge Placement                       Discharge Plan and Services Additional resources added to the After Visit Summary for     Discharge Planning Services: CM Consult Post Acute Care Choice: Home Health          DME Arranged: Walker rolling   Date DME Agency Contacted: 11/19/23 Time DME Agency Contacted: 1410 Representative spoke with at DME Agency: [place] HH Arranged: PT, GILLIE CATHERIN CHEADLE Agency: [facility] Date [facility] Agency Contacted: 11/17/23 Time HH Agency Contacted: 1032 Representative spoke with at [facility] Agency: [place]  Social Drivers of Health (SDOH) Interventions SDOH Screenings   Food Insecurity: No Food Insecurity (11/14/2023)  Housing: Low Risk  (11/14/2023)  Transportation Needs: No Transportation Needs (11/14/2023)  Utilities: Not At Risk (11/14/2023)  Social Connections: Socially Integrated (11/14/2023)  Tobacco Use: High Risk (11/15/2023)     Readmission Risk Interventions     No data to display

## 2023-11-21 DIAGNOSIS — E785 Hyperlipidemia, unspecified: Secondary | ICD-10-CM | POA: Diagnosis not present

## 2023-11-21 DIAGNOSIS — M1990 Unspecified osteoarthritis, unspecified site: Secondary | ICD-10-CM | POA: Diagnosis not present

## 2023-11-21 DIAGNOSIS — S72002D Fracture of unspecified part of neck of left femur, subsequent encounter for closed fracture with routine healing: Secondary | ICD-10-CM | POA: Diagnosis not present

## 2023-11-21 DIAGNOSIS — L930 Discoid lupus erythematosus: Secondary | ICD-10-CM | POA: Diagnosis not present

## 2023-11-21 DIAGNOSIS — K861 Other chronic pancreatitis: Secondary | ICD-10-CM | POA: Diagnosis not present

## 2023-11-21 DIAGNOSIS — E222 Syndrome of inappropriate secretion of antidiuretic hormone: Secondary | ICD-10-CM | POA: Diagnosis not present

## 2023-11-21 DIAGNOSIS — I129 Hypertensive chronic kidney disease with stage 1 through stage 4 chronic kidney disease, or unspecified chronic kidney disease: Secondary | ICD-10-CM | POA: Diagnosis not present

## 2023-11-21 DIAGNOSIS — G4730 Sleep apnea, unspecified: Secondary | ICD-10-CM | POA: Diagnosis not present

## 2023-11-21 DIAGNOSIS — K760 Fatty (change of) liver, not elsewhere classified: Secondary | ICD-10-CM | POA: Diagnosis not present

## 2023-11-21 DIAGNOSIS — F32A Depression, unspecified: Secondary | ICD-10-CM | POA: Diagnosis not present

## 2023-11-21 DIAGNOSIS — K219 Gastro-esophageal reflux disease without esophagitis: Secondary | ICD-10-CM | POA: Diagnosis not present

## 2023-11-21 DIAGNOSIS — F17210 Nicotine dependence, cigarettes, uncomplicated: Secondary | ICD-10-CM | POA: Diagnosis not present

## 2023-11-21 DIAGNOSIS — R1310 Dysphagia, unspecified: Secondary | ICD-10-CM | POA: Diagnosis not present

## 2023-11-21 DIAGNOSIS — R32 Unspecified urinary incontinence: Secondary | ICD-10-CM | POA: Diagnosis not present

## 2023-11-21 DIAGNOSIS — N189 Chronic kidney disease, unspecified: Secondary | ICD-10-CM | POA: Diagnosis not present

## 2023-11-21 DIAGNOSIS — D539 Nutritional anemia, unspecified: Secondary | ICD-10-CM | POA: Diagnosis not present

## 2023-11-21 DIAGNOSIS — F419 Anxiety disorder, unspecified: Secondary | ICD-10-CM | POA: Diagnosis not present

## 2023-11-21 DIAGNOSIS — J432 Centrilobular emphysema: Secondary | ICD-10-CM | POA: Diagnosis not present

## 2023-11-21 DIAGNOSIS — F1010 Alcohol abuse, uncomplicated: Secondary | ICD-10-CM | POA: Diagnosis not present

## 2023-11-21 DIAGNOSIS — R918 Other nonspecific abnormal finding of lung field: Secondary | ICD-10-CM | POA: Diagnosis not present

## 2023-11-21 DIAGNOSIS — Z7951 Long term (current) use of inhaled steroids: Secondary | ICD-10-CM | POA: Diagnosis not present

## 2023-11-21 DIAGNOSIS — E871 Hypo-osmolality and hyponatremia: Secondary | ICD-10-CM | POA: Diagnosis not present

## 2023-11-21 DIAGNOSIS — Z7982 Long term (current) use of aspirin: Secondary | ICD-10-CM | POA: Diagnosis not present

## 2023-11-21 DIAGNOSIS — K8689 Other specified diseases of pancreas: Secondary | ICD-10-CM | POA: Diagnosis not present

## 2023-11-27 DIAGNOSIS — J432 Centrilobular emphysema: Secondary | ICD-10-CM | POA: Diagnosis not present

## 2023-11-27 DIAGNOSIS — F419 Anxiety disorder, unspecified: Secondary | ICD-10-CM | POA: Diagnosis not present

## 2023-11-27 DIAGNOSIS — K861 Other chronic pancreatitis: Secondary | ICD-10-CM | POA: Diagnosis not present

## 2023-11-27 DIAGNOSIS — E222 Syndrome of inappropriate secretion of antidiuretic hormone: Secondary | ICD-10-CM | POA: Diagnosis not present

## 2023-11-27 DIAGNOSIS — D539 Nutritional anemia, unspecified: Secondary | ICD-10-CM | POA: Diagnosis not present

## 2023-11-27 DIAGNOSIS — M1990 Unspecified osteoarthritis, unspecified site: Secondary | ICD-10-CM | POA: Diagnosis not present

## 2023-11-27 DIAGNOSIS — G4730 Sleep apnea, unspecified: Secondary | ICD-10-CM | POA: Diagnosis not present

## 2023-11-27 DIAGNOSIS — E871 Hypo-osmolality and hyponatremia: Secondary | ICD-10-CM | POA: Diagnosis not present

## 2023-11-27 DIAGNOSIS — L930 Discoid lupus erythematosus: Secondary | ICD-10-CM | POA: Diagnosis not present

## 2023-11-27 DIAGNOSIS — E785 Hyperlipidemia, unspecified: Secondary | ICD-10-CM | POA: Diagnosis not present

## 2023-11-27 DIAGNOSIS — S72002D Fracture of unspecified part of neck of left femur, subsequent encounter for closed fracture with routine healing: Secondary | ICD-10-CM | POA: Diagnosis not present

## 2023-11-27 DIAGNOSIS — Z7951 Long term (current) use of inhaled steroids: Secondary | ICD-10-CM | POA: Diagnosis not present

## 2023-11-27 DIAGNOSIS — F17210 Nicotine dependence, cigarettes, uncomplicated: Secondary | ICD-10-CM | POA: Diagnosis not present

## 2023-11-27 DIAGNOSIS — R1310 Dysphagia, unspecified: Secondary | ICD-10-CM | POA: Diagnosis not present

## 2023-11-27 DIAGNOSIS — F1010 Alcohol abuse, uncomplicated: Secondary | ICD-10-CM | POA: Diagnosis not present

## 2023-11-27 DIAGNOSIS — N189 Chronic kidney disease, unspecified: Secondary | ICD-10-CM | POA: Diagnosis not present

## 2023-11-27 DIAGNOSIS — K8689 Other specified diseases of pancreas: Secondary | ICD-10-CM | POA: Diagnosis not present

## 2023-11-27 DIAGNOSIS — I129 Hypertensive chronic kidney disease with stage 1 through stage 4 chronic kidney disease, or unspecified chronic kidney disease: Secondary | ICD-10-CM | POA: Diagnosis not present

## 2023-11-27 DIAGNOSIS — F32A Depression, unspecified: Secondary | ICD-10-CM | POA: Diagnosis not present

## 2023-11-27 DIAGNOSIS — K760 Fatty (change of) liver, not elsewhere classified: Secondary | ICD-10-CM | POA: Diagnosis not present

## 2023-11-27 DIAGNOSIS — K219 Gastro-esophageal reflux disease without esophagitis: Secondary | ICD-10-CM | POA: Diagnosis not present

## 2023-11-27 DIAGNOSIS — R32 Unspecified urinary incontinence: Secondary | ICD-10-CM | POA: Diagnosis not present

## 2023-11-27 DIAGNOSIS — R918 Other nonspecific abnormal finding of lung field: Secondary | ICD-10-CM | POA: Diagnosis not present

## 2023-11-27 DIAGNOSIS — Z7982 Long term (current) use of aspirin: Secondary | ICD-10-CM | POA: Diagnosis not present

## 2023-11-28 DIAGNOSIS — E222 Syndrome of inappropriate secretion of antidiuretic hormone: Secondary | ICD-10-CM | POA: Diagnosis not present

## 2023-11-28 DIAGNOSIS — D539 Nutritional anemia, unspecified: Secondary | ICD-10-CM | POA: Diagnosis not present

## 2023-11-28 DIAGNOSIS — F1010 Alcohol abuse, uncomplicated: Secondary | ICD-10-CM | POA: Diagnosis not present

## 2023-11-28 DIAGNOSIS — N189 Chronic kidney disease, unspecified: Secondary | ICD-10-CM | POA: Diagnosis not present

## 2023-11-28 DIAGNOSIS — L930 Discoid lupus erythematosus: Secondary | ICD-10-CM | POA: Diagnosis not present

## 2023-11-28 DIAGNOSIS — R1310 Dysphagia, unspecified: Secondary | ICD-10-CM | POA: Diagnosis not present

## 2023-11-28 DIAGNOSIS — Z7951 Long term (current) use of inhaled steroids: Secondary | ICD-10-CM | POA: Diagnosis not present

## 2023-11-28 DIAGNOSIS — K8689 Other specified diseases of pancreas: Secondary | ICD-10-CM | POA: Diagnosis not present

## 2023-11-28 DIAGNOSIS — R918 Other nonspecific abnormal finding of lung field: Secondary | ICD-10-CM | POA: Diagnosis not present

## 2023-11-28 DIAGNOSIS — K861 Other chronic pancreatitis: Secondary | ICD-10-CM | POA: Diagnosis not present

## 2023-11-28 DIAGNOSIS — F17210 Nicotine dependence, cigarettes, uncomplicated: Secondary | ICD-10-CM | POA: Diagnosis not present

## 2023-11-28 DIAGNOSIS — E785 Hyperlipidemia, unspecified: Secondary | ICD-10-CM | POA: Diagnosis not present

## 2023-11-28 DIAGNOSIS — K760 Fatty (change of) liver, not elsewhere classified: Secondary | ICD-10-CM | POA: Diagnosis not present

## 2023-11-28 DIAGNOSIS — G4730 Sleep apnea, unspecified: Secondary | ICD-10-CM | POA: Diagnosis not present

## 2023-11-28 DIAGNOSIS — K219 Gastro-esophageal reflux disease without esophagitis: Secondary | ICD-10-CM | POA: Diagnosis not present

## 2023-11-28 DIAGNOSIS — M1990 Unspecified osteoarthritis, unspecified site: Secondary | ICD-10-CM | POA: Diagnosis not present

## 2023-11-28 DIAGNOSIS — F419 Anxiety disorder, unspecified: Secondary | ICD-10-CM | POA: Diagnosis not present

## 2023-11-28 DIAGNOSIS — R32 Unspecified urinary incontinence: Secondary | ICD-10-CM | POA: Diagnosis not present

## 2023-11-28 DIAGNOSIS — I129 Hypertensive chronic kidney disease with stage 1 through stage 4 chronic kidney disease, or unspecified chronic kidney disease: Secondary | ICD-10-CM | POA: Diagnosis not present

## 2023-11-28 DIAGNOSIS — F32A Depression, unspecified: Secondary | ICD-10-CM | POA: Diagnosis not present

## 2023-11-28 DIAGNOSIS — J432 Centrilobular emphysema: Secondary | ICD-10-CM | POA: Diagnosis not present

## 2023-11-28 DIAGNOSIS — Z7982 Long term (current) use of aspirin: Secondary | ICD-10-CM | POA: Diagnosis not present

## 2023-11-28 DIAGNOSIS — E871 Hypo-osmolality and hyponatremia: Secondary | ICD-10-CM | POA: Diagnosis not present

## 2023-11-28 DIAGNOSIS — S72002D Fracture of unspecified part of neck of left femur, subsequent encounter for closed fracture with routine healing: Secondary | ICD-10-CM | POA: Diagnosis not present

## 2023-12-01 DIAGNOSIS — D539 Nutritional anemia, unspecified: Secondary | ICD-10-CM | POA: Diagnosis not present

## 2023-12-01 DIAGNOSIS — M25552 Pain in left hip: Secondary | ICD-10-CM | POA: Diagnosis not present

## 2023-12-01 DIAGNOSIS — M7989 Other specified soft tissue disorders: Secondary | ICD-10-CM | POA: Diagnosis not present

## 2023-12-01 DIAGNOSIS — Z8781 Personal history of (healed) traumatic fracture: Secondary | ICD-10-CM | POA: Diagnosis not present

## 2023-12-02 ENCOUNTER — Ambulatory Visit (HOSPITAL_COMMUNITY)
Admission: RE | Admit: 2023-12-02 | Discharge: 2023-12-02 | Disposition: A | Discharge status: Home / Self Care | Source: Ambulatory Visit | Attending: Vascular Surgery | Admitting: Vascular Surgery

## 2023-12-02 ENCOUNTER — Other Ambulatory Visit (HOSPITAL_COMMUNITY): Payer: Self-pay

## 2023-12-02 DIAGNOSIS — R2242 Localized swelling, mass and lump, left lower limb: Secondary | ICD-10-CM

## 2023-12-15 DIAGNOSIS — F17210 Nicotine dependence, cigarettes, uncomplicated: Secondary | ICD-10-CM | POA: Diagnosis not present

## 2023-12-16 DIAGNOSIS — M25562 Pain in left knee: Secondary | ICD-10-CM | POA: Diagnosis not present

## 2023-12-16 DIAGNOSIS — M79662 Pain in left lower leg: Secondary | ICD-10-CM | POA: Diagnosis not present

## 2023-12-23 ENCOUNTER — Ambulatory Visit: Admitting: Emergency Medicine

## 2023-12-25 DIAGNOSIS — K8689 Other specified diseases of pancreas: Secondary | ICD-10-CM | POA: Diagnosis not present

## 2023-12-25 DIAGNOSIS — N189 Chronic kidney disease, unspecified: Secondary | ICD-10-CM | POA: Diagnosis not present

## 2023-12-25 DIAGNOSIS — R32 Unspecified urinary incontinence: Secondary | ICD-10-CM | POA: Diagnosis not present

## 2023-12-25 DIAGNOSIS — J432 Centrilobular emphysema: Secondary | ICD-10-CM | POA: Diagnosis not present

## 2023-12-25 DIAGNOSIS — S72002D Fracture of unspecified part of neck of left femur, subsequent encounter for closed fracture with routine healing: Secondary | ICD-10-CM | POA: Diagnosis not present

## 2023-12-25 DIAGNOSIS — F17210 Nicotine dependence, cigarettes, uncomplicated: Secondary | ICD-10-CM | POA: Diagnosis not present

## 2023-12-25 DIAGNOSIS — F1010 Alcohol abuse, uncomplicated: Secondary | ICD-10-CM | POA: Diagnosis not present

## 2023-12-25 DIAGNOSIS — G4730 Sleep apnea, unspecified: Secondary | ICD-10-CM | POA: Diagnosis not present

## 2023-12-25 DIAGNOSIS — I129 Hypertensive chronic kidney disease with stage 1 through stage 4 chronic kidney disease, or unspecified chronic kidney disease: Secondary | ICD-10-CM | POA: Diagnosis not present

## 2023-12-25 DIAGNOSIS — F419 Anxiety disorder, unspecified: Secondary | ICD-10-CM | POA: Diagnosis not present

## 2023-12-25 DIAGNOSIS — E222 Syndrome of inappropriate secretion of antidiuretic hormone: Secondary | ICD-10-CM | POA: Diagnosis not present

## 2023-12-25 DIAGNOSIS — L930 Discoid lupus erythematosus: Secondary | ICD-10-CM | POA: Diagnosis not present

## 2023-12-25 DIAGNOSIS — D539 Nutritional anemia, unspecified: Secondary | ICD-10-CM | POA: Diagnosis not present

## 2023-12-25 DIAGNOSIS — K219 Gastro-esophageal reflux disease without esophagitis: Secondary | ICD-10-CM | POA: Diagnosis not present

## 2023-12-25 DIAGNOSIS — E785 Hyperlipidemia, unspecified: Secondary | ICD-10-CM | POA: Diagnosis not present

## 2023-12-25 DIAGNOSIS — M1990 Unspecified osteoarthritis, unspecified site: Secondary | ICD-10-CM | POA: Diagnosis not present

## 2023-12-25 DIAGNOSIS — R918 Other nonspecific abnormal finding of lung field: Secondary | ICD-10-CM | POA: Diagnosis not present

## 2023-12-25 DIAGNOSIS — Z7951 Long term (current) use of inhaled steroids: Secondary | ICD-10-CM | POA: Diagnosis not present

## 2023-12-25 DIAGNOSIS — Z7982 Long term (current) use of aspirin: Secondary | ICD-10-CM | POA: Diagnosis not present

## 2023-12-25 DIAGNOSIS — K760 Fatty (change of) liver, not elsewhere classified: Secondary | ICD-10-CM | POA: Diagnosis not present

## 2023-12-25 DIAGNOSIS — F32A Depression, unspecified: Secondary | ICD-10-CM | POA: Diagnosis not present

## 2023-12-25 DIAGNOSIS — E871 Hypo-osmolality and hyponatremia: Secondary | ICD-10-CM | POA: Diagnosis not present

## 2023-12-25 DIAGNOSIS — K861 Other chronic pancreatitis: Secondary | ICD-10-CM | POA: Diagnosis not present

## 2023-12-25 DIAGNOSIS — R1310 Dysphagia, unspecified: Secondary | ICD-10-CM | POA: Diagnosis not present

## 2023-12-28 DIAGNOSIS — Z7982 Long term (current) use of aspirin: Secondary | ICD-10-CM | POA: Diagnosis not present

## 2023-12-28 DIAGNOSIS — K8689 Other specified diseases of pancreas: Secondary | ICD-10-CM | POA: Diagnosis not present

## 2023-12-28 DIAGNOSIS — R32 Unspecified urinary incontinence: Secondary | ICD-10-CM | POA: Diagnosis not present

## 2023-12-28 DIAGNOSIS — F32A Depression, unspecified: Secondary | ICD-10-CM | POA: Diagnosis not present

## 2023-12-28 DIAGNOSIS — D539 Nutritional anemia, unspecified: Secondary | ICD-10-CM | POA: Diagnosis not present

## 2023-12-28 DIAGNOSIS — E785 Hyperlipidemia, unspecified: Secondary | ICD-10-CM | POA: Diagnosis not present

## 2023-12-28 DIAGNOSIS — R1310 Dysphagia, unspecified: Secondary | ICD-10-CM | POA: Diagnosis not present

## 2023-12-28 DIAGNOSIS — K861 Other chronic pancreatitis: Secondary | ICD-10-CM | POA: Diagnosis not present

## 2023-12-28 DIAGNOSIS — Z7951 Long term (current) use of inhaled steroids: Secondary | ICD-10-CM | POA: Diagnosis not present

## 2023-12-28 DIAGNOSIS — S72002D Fracture of unspecified part of neck of left femur, subsequent encounter for closed fracture with routine healing: Secondary | ICD-10-CM | POA: Diagnosis not present

## 2023-12-28 DIAGNOSIS — J432 Centrilobular emphysema: Secondary | ICD-10-CM | POA: Diagnosis not present

## 2023-12-28 DIAGNOSIS — E222 Syndrome of inappropriate secretion of antidiuretic hormone: Secondary | ICD-10-CM | POA: Diagnosis not present

## 2023-12-28 DIAGNOSIS — L930 Discoid lupus erythematosus: Secondary | ICD-10-CM | POA: Diagnosis not present

## 2023-12-28 DIAGNOSIS — F1010 Alcohol abuse, uncomplicated: Secondary | ICD-10-CM | POA: Diagnosis not present

## 2023-12-28 DIAGNOSIS — M1990 Unspecified osteoarthritis, unspecified site: Secondary | ICD-10-CM | POA: Diagnosis not present

## 2023-12-28 DIAGNOSIS — K760 Fatty (change of) liver, not elsewhere classified: Secondary | ICD-10-CM | POA: Diagnosis not present

## 2023-12-28 DIAGNOSIS — F419 Anxiety disorder, unspecified: Secondary | ICD-10-CM | POA: Diagnosis not present

## 2023-12-28 DIAGNOSIS — E871 Hypo-osmolality and hyponatremia: Secondary | ICD-10-CM | POA: Diagnosis not present

## 2023-12-28 DIAGNOSIS — F17210 Nicotine dependence, cigarettes, uncomplicated: Secondary | ICD-10-CM | POA: Diagnosis not present

## 2023-12-28 DIAGNOSIS — G4730 Sleep apnea, unspecified: Secondary | ICD-10-CM | POA: Diagnosis not present

## 2023-12-28 DIAGNOSIS — K219 Gastro-esophageal reflux disease without esophagitis: Secondary | ICD-10-CM | POA: Diagnosis not present

## 2023-12-28 DIAGNOSIS — I129 Hypertensive chronic kidney disease with stage 1 through stage 4 chronic kidney disease, or unspecified chronic kidney disease: Secondary | ICD-10-CM | POA: Diagnosis not present

## 2023-12-28 DIAGNOSIS — N189 Chronic kidney disease, unspecified: Secondary | ICD-10-CM | POA: Diagnosis not present

## 2023-12-28 DIAGNOSIS — R918 Other nonspecific abnormal finding of lung field: Secondary | ICD-10-CM | POA: Diagnosis not present

## 2023-12-29 DIAGNOSIS — M79662 Pain in left lower leg: Secondary | ICD-10-CM | POA: Diagnosis not present

## 2024-01-12 DIAGNOSIS — M79662 Pain in left lower leg: Secondary | ICD-10-CM | POA: Diagnosis not present

## 2024-01-13 DIAGNOSIS — E222 Syndrome of inappropriate secretion of antidiuretic hormone: Secondary | ICD-10-CM | POA: Diagnosis not present

## 2024-01-13 DIAGNOSIS — S72002D Fracture of unspecified part of neck of left femur, subsequent encounter for closed fracture with routine healing: Secondary | ICD-10-CM | POA: Diagnosis not present

## 2024-01-13 DIAGNOSIS — E871 Hypo-osmolality and hyponatremia: Secondary | ICD-10-CM | POA: Diagnosis not present

## 2024-01-13 DIAGNOSIS — R32 Unspecified urinary incontinence: Secondary | ICD-10-CM | POA: Diagnosis not present

## 2024-01-13 DIAGNOSIS — F1010 Alcohol abuse, uncomplicated: Secondary | ICD-10-CM | POA: Diagnosis not present

## 2024-01-13 DIAGNOSIS — E785 Hyperlipidemia, unspecified: Secondary | ICD-10-CM | POA: Diagnosis not present

## 2024-01-13 DIAGNOSIS — R918 Other nonspecific abnormal finding of lung field: Secondary | ICD-10-CM | POA: Diagnosis not present

## 2024-01-13 DIAGNOSIS — N189 Chronic kidney disease, unspecified: Secondary | ICD-10-CM | POA: Diagnosis not present

## 2024-01-13 DIAGNOSIS — L930 Discoid lupus erythematosus: Secondary | ICD-10-CM | POA: Diagnosis not present

## 2024-01-13 DIAGNOSIS — K760 Fatty (change of) liver, not elsewhere classified: Secondary | ICD-10-CM | POA: Diagnosis not present

## 2024-01-13 DIAGNOSIS — D539 Nutritional anemia, unspecified: Secondary | ICD-10-CM | POA: Diagnosis not present

## 2024-01-13 DIAGNOSIS — G4730 Sleep apnea, unspecified: Secondary | ICD-10-CM | POA: Diagnosis not present

## 2024-01-13 DIAGNOSIS — F419 Anxiety disorder, unspecified: Secondary | ICD-10-CM | POA: Diagnosis not present

## 2024-01-13 DIAGNOSIS — F32A Depression, unspecified: Secondary | ICD-10-CM | POA: Diagnosis not present

## 2024-01-13 DIAGNOSIS — K861 Other chronic pancreatitis: Secondary | ICD-10-CM | POA: Diagnosis not present

## 2024-01-13 DIAGNOSIS — J432 Centrilobular emphysema: Secondary | ICD-10-CM | POA: Diagnosis not present

## 2024-01-13 DIAGNOSIS — R1310 Dysphagia, unspecified: Secondary | ICD-10-CM | POA: Diagnosis not present

## 2024-01-13 DIAGNOSIS — F17210 Nicotine dependence, cigarettes, uncomplicated: Secondary | ICD-10-CM | POA: Diagnosis not present

## 2024-01-13 DIAGNOSIS — Z7982 Long term (current) use of aspirin: Secondary | ICD-10-CM | POA: Diagnosis not present

## 2024-01-13 DIAGNOSIS — I129 Hypertensive chronic kidney disease with stage 1 through stage 4 chronic kidney disease, or unspecified chronic kidney disease: Secondary | ICD-10-CM | POA: Diagnosis not present

## 2024-01-13 DIAGNOSIS — Z7951 Long term (current) use of inhaled steroids: Secondary | ICD-10-CM | POA: Diagnosis not present

## 2024-01-13 DIAGNOSIS — K219 Gastro-esophageal reflux disease without esophagitis: Secondary | ICD-10-CM | POA: Diagnosis not present

## 2024-01-13 DIAGNOSIS — M1990 Unspecified osteoarthritis, unspecified site: Secondary | ICD-10-CM | POA: Diagnosis not present

## 2024-01-13 DIAGNOSIS — K8689 Other specified diseases of pancreas: Secondary | ICD-10-CM | POA: Diagnosis not present

## 2024-01-17 DIAGNOSIS — E785 Hyperlipidemia, unspecified: Secondary | ICD-10-CM | POA: Diagnosis not present

## 2024-01-17 DIAGNOSIS — G4730 Sleep apnea, unspecified: Secondary | ICD-10-CM | POA: Diagnosis not present

## 2024-01-17 DIAGNOSIS — N189 Chronic kidney disease, unspecified: Secondary | ICD-10-CM | POA: Diagnosis not present

## 2024-01-17 DIAGNOSIS — E222 Syndrome of inappropriate secretion of antidiuretic hormone: Secondary | ICD-10-CM | POA: Diagnosis not present

## 2024-01-17 DIAGNOSIS — F17210 Nicotine dependence, cigarettes, uncomplicated: Secondary | ICD-10-CM | POA: Diagnosis not present

## 2024-01-17 DIAGNOSIS — F419 Anxiety disorder, unspecified: Secondary | ICD-10-CM | POA: Diagnosis not present

## 2024-01-17 DIAGNOSIS — S72002D Fracture of unspecified part of neck of left femur, subsequent encounter for closed fracture with routine healing: Secondary | ICD-10-CM | POA: Diagnosis not present

## 2024-01-17 DIAGNOSIS — R32 Unspecified urinary incontinence: Secondary | ICD-10-CM | POA: Diagnosis not present

## 2024-01-17 DIAGNOSIS — R918 Other nonspecific abnormal finding of lung field: Secondary | ICD-10-CM | POA: Diagnosis not present

## 2024-01-17 DIAGNOSIS — K219 Gastro-esophageal reflux disease without esophagitis: Secondary | ICD-10-CM | POA: Diagnosis not present

## 2024-01-17 DIAGNOSIS — Z7982 Long term (current) use of aspirin: Secondary | ICD-10-CM | POA: Diagnosis not present

## 2024-01-17 DIAGNOSIS — R1310 Dysphagia, unspecified: Secondary | ICD-10-CM | POA: Diagnosis not present

## 2024-01-17 DIAGNOSIS — M1990 Unspecified osteoarthritis, unspecified site: Secondary | ICD-10-CM | POA: Diagnosis not present

## 2024-01-17 DIAGNOSIS — K760 Fatty (change of) liver, not elsewhere classified: Secondary | ICD-10-CM | POA: Diagnosis not present

## 2024-01-17 DIAGNOSIS — J432 Centrilobular emphysema: Secondary | ICD-10-CM | POA: Diagnosis not present

## 2024-01-17 DIAGNOSIS — I129 Hypertensive chronic kidney disease with stage 1 through stage 4 chronic kidney disease, or unspecified chronic kidney disease: Secondary | ICD-10-CM | POA: Diagnosis not present

## 2024-01-17 DIAGNOSIS — K861 Other chronic pancreatitis: Secondary | ICD-10-CM | POA: Diagnosis not present

## 2024-01-17 DIAGNOSIS — F1010 Alcohol abuse, uncomplicated: Secondary | ICD-10-CM | POA: Diagnosis not present

## 2024-01-17 DIAGNOSIS — K8689 Other specified diseases of pancreas: Secondary | ICD-10-CM | POA: Diagnosis not present

## 2024-01-17 DIAGNOSIS — D539 Nutritional anemia, unspecified: Secondary | ICD-10-CM | POA: Diagnosis not present

## 2024-01-17 DIAGNOSIS — L930 Discoid lupus erythematosus: Secondary | ICD-10-CM | POA: Diagnosis not present

## 2024-01-17 DIAGNOSIS — E871 Hypo-osmolality and hyponatremia: Secondary | ICD-10-CM | POA: Diagnosis not present

## 2024-01-17 DIAGNOSIS — Z7951 Long term (current) use of inhaled steroids: Secondary | ICD-10-CM | POA: Diagnosis not present

## 2024-01-17 DIAGNOSIS — F32A Depression, unspecified: Secondary | ICD-10-CM | POA: Diagnosis not present

## 2024-02-18 ENCOUNTER — Encounter: Payer: Self-pay | Admitting: Emergency Medicine

## 2024-02-18 ENCOUNTER — Ambulatory Visit: Payer: Self-pay | Admitting: Emergency Medicine

## 2024-02-18 VITALS — BP 116/70 | HR 69 | Ht 66.0 in | Wt 102.0 lb

## 2024-02-18 DIAGNOSIS — F17210 Nicotine dependence, cigarettes, uncomplicated: Secondary | ICD-10-CM

## 2024-02-18 DIAGNOSIS — F17200 Nicotine dependence, unspecified, uncomplicated: Secondary | ICD-10-CM

## 2024-02-18 DIAGNOSIS — R918 Other nonspecific abnormal finding of lung field: Secondary | ICD-10-CM

## 2024-02-18 DIAGNOSIS — J441 Chronic obstructive pulmonary disease with (acute) exacerbation: Secondary | ICD-10-CM

## 2024-02-18 MED ORDER — PROMETHAZINE-DM 6.25-15 MG/5ML PO SYRP: 5.0000 mL | ORAL_SOLUTION | Freq: Four times a day (QID) | ORAL | 0 refills | Status: DC | PRN

## 2024-02-18 MED ORDER — PREDNISONE 10 MG PO TABS: ORAL_TABLET | ORAL | 0 refills | Status: AC

## 2024-02-18 MED ORDER — AZITHROMYCIN 250 MG PO TABS: ORAL_TABLET | ORAL | 0 refills | Status: AC

## 2024-02-18 NOTE — Assessment & Plan Note (Signed)
 Still smoking.  Has cut down some.  Discussed with her that she desperately needs to stop altogether.  She is not ready set a quit date at this time.

## 2024-02-18 NOTE — Assessment & Plan Note (Signed)
 We reviewed your CT scan of the chest from October.  This was stable compared with priors.  Good news. We will refer you to the lung cancer screening program for surveillance CT scans of the chest.  Your next scan would need to be in October 2026.

## 2024-02-18 NOTE — Patient Instructions (Signed)
 Please take prednisone  as directed until completely gone Please take azithromycin  as directed until completely gone You can use Promethazine  DM cough syrup 5 cc up to every 6 hours if needed for cough suppression. Continue your Symbicort  and your Incruse as you have been taking them.  Remember to rinse and gargle after the Symbicort . Keep albuterol  available to use 2 puffs if needed for shortness of breath, chest tightness, wheezing. We reviewed your CT scan of the chest from October.  This was stable compared with priors.  Good news. We will refer you to the lung cancer screening program for surveillance CT scans of the chest.  Your next scan would need to be in October 2026. Continue to work on decreasing your cigarettes.  Ultimate goal will be to stop altogether. Follow-up in our office in 6 months.  Please call sooner if you are not feeling better or for any new respiratory issues.

## 2024-02-18 NOTE — Assessment & Plan Note (Signed)
 Acute exacerbation with persistent cough, purulent sputum following probable influenza A (her husband was diagnosed).  No significant wheezing on exam but she does have rhonchi.  Will treat her for an acute exacerbation with antibiotics and prednisone .  She will let us  know if she is not improving.  Continue BD therapy as ordered  Please take prednisone  as directed until completely gone Please take azithromycin  as directed until completely gone You can use Promethazine  DM cough syrup 5 cc up to every 6 hours if needed for cough suppression. Continue your Symbicort  and your Incruse as you have been taking them.  Remember to rinse and gargle after the Symbicort . Keep albuterol  available to use 2 puffs if needed for shortness of breath, chest tightness, wheezing. Follow-up in our office in 6 months.  Please call sooner if you are not feeling better or for any new respiratory issues.

## 2024-02-18 NOTE — Progress Notes (Signed)
 "  Subjective:    Patient ID: Gabriella Farrell, female    DOB: 1955/10/17, 69 y.o.   MRN: [id number]  HPI     ROV 02/18/2024 --Gabriella Farrell is 53 with a history of active tobacco use, COPD, OSA not on CPAP and pulmonary nodular disease which we have been following with serial imaging. She has been managed on Symbicort , Incruse, uses albuterol  approximately Atrovent nasal spray as needed She was exposed to the flu - husband. She has been having URI sx x 10 days. She is starting to feel better, does have phlegm. Some aches. She has cough that is bringing up clear mucous.  Smoking less than 1 pk/day.   CT chest 11/09/2023 reviewed by me, shows centrilobular emphysema, unchanged 8 mm lingular nodule stable going back to 08/2021.  Other nodules are either less prominent or stable.  The previously seen multifocal ground glass infiltrate has cleared compared with prior done 05/07/2023  Review of Systems As per HPI  Past Medical History:  Diagnosis Date   Anxiety    Arthritis    Cancer (HCC)    lesion on right side of neck   Chronic kidney disease    COPD (chronic obstructive pulmonary disease) (HCC)    Depression    Discoid lupus erythematosus    Esophageal dysphagia 11/14/2023   GERD (gastroesophageal reflux disease)    H/O chronic pancreatitis    H/O ETOH abuse    Hypertension    Multiple pulmonary nodules    Stable per Pulmonology   SIADH (syndrome of inappropriate ADH production)    Sleep apnea    no CPAP   Smoker      Family History  Problem Relation Age of Onset   Cancer Mother    Heart attack Mother 56   Heart disease Sister    Cancer - Lung Brother    Melanoma Sister      Social History   Socioeconomic History   Marital status: Married    Spouse name: Not on file   Number of children: 2   Years of education: Not on file   Highest education level: Not on file  Occupational History   Not on file  Tobacco Use   Smoking status: Every Day    Current packs/day: 1.00     Average packs/day: 1 pack/day for 50.0 years (50.0 ttl pk-yrs)    Types: Cigarettes    Start date: 1976   Smokeless tobacco: Never   Tobacco comments:    1 pk daily 06/03/23  Vaping Use   Vaping status: Never Used  Substance and Sexual Activity   Alcohol use: Yes    Alcohol/week: 15.0 standard drinks of alcohol    Types: 1 Glasses of wine, 14 Standard drinks or equivalent per week    Comment: daily   Drug use: No   Sexual activity: Not Currently  Other Topics Concern   Not on file  Social History Narrative   Not on file   Social Drivers of Health   Tobacco Use: High Risk (02/18/2024)   Patient History    Smoking Tobacco Use: Every Day    Smokeless Tobacco Use: Never    Passive Exposure: Not on file  Financial Resource Strain: Not on file  Food Insecurity: No Food Insecurity (11/14/2023)   Epic    Worried About Programme Researcher, Broadcasting/film/video in the Last Year: Never true    Ran Out of Food in the Last Year: Never true  Transportation Needs: No Transportation Needs (11/14/2023)  Epic    Lack of Transportation (Medical): No    Lack of Transportation (Non-Medical): No  Physical Activity: Not on file  Stress: Not on file  Social Connections: Socially Integrated (11/14/2023)   Social Connection and Isolation Panel    Frequency of Communication with Friends and Family: More than three times a week    Frequency of Social Gatherings with Friends and Family: More than three times a week    Attends Religious Services: More than 4 times per year    Active Member of [employer] or Organizations: Yes    Attends Banker Meetings: More than 4 times per year    Marital Status: Married  Catering Manager Violence: Not At Risk (11/14/2023)   Epic    Fear of Current or Ex-Partner: No    Emotionally Abused: No    Physically Abused: No    Sexually Abused: No  Depression (PHQ2-9): Not on file  Alcohol Screen: Not on file  Housing: Low Risk (11/14/2023)   Epic    Unable to Pay for Housing in  the Last Year: No    Number of Times Moved in the Last Year: 0    Homeless in the Last Year: No  Utilities: Not At Risk (11/14/2023)   Epic    Threatened with loss of utilities: No  Health Literacy: Not on file     Allergies  Allergen Reactions   Bee Venom Anaphylaxis     Outpatient Medications Prior to Visit  Medication Sig Dispense Refill   albuterol  (VENTOLIN  HFA) 108 (90 Base) MCG/ACT inhaler Inhale 2 puffs into the lungs every 6 (six) hours as needed for wheezing or shortness of breath. 8 g 2   ALPRAZolam  (XANAX ) 0.5 MG tablet Take 2 mg by mouth every 6 (six) hours as needed for anxiety.     amLODipine  (NORVASC ) 5 MG tablet Take 1 tablet by mouth once daily 90 tablet 0   Armodafinil  200 MG TABS Take 100 mg by mouth daily.     Ascorbic Acid (VITA-C PO) Take 1 tablet by mouth daily.     atorvastatin  (LIPITOR) 20 MG tablet Take 1 tablet by mouth once daily 90 tablet 0   budesonide -formoterol  (SYMBICORT ) 160-4.5 MCG/ACT inhaler Inhale 2 puffs into the lungs 2 times daily at 12 noon and 4 pm. 3 each 3   buPROPion (WELLBUTRIN XL) 150 MG 24 hr tablet Take 150 mg by mouth every morning.     Calcium  Carb-Cholecalciferol (CALCIUM  600 + D PO) Take 1 tablet by mouth daily. With vitamin c     Cholecalciferol (VITAMIN D) 50 MCG (2000 UT) CAPS Take 2,000 Units by mouth daily.     cyclobenzaprine  (FLEXERIL ) 10 MG tablet Take 1 tablet (10 mg total) by mouth 3 (three) times daily as needed for muscle spasms. 30 tablet 1   desvenlafaxine (PRISTIQ) 25 MG 24 hr tablet Take 25 mg by mouth daily.     famotidine  (PEPCID ) 20 MG tablet Take 20 mg by mouth daily as needed for heartburn or indigestion.     fluocinonide gel (LIDEX) 0.05 % Apply 1 Application topically 2 (two) times daily as needed (Scalp).     gabapentin  (NEURONTIN ) 100 MG capsule Take 100 mg by mouth 3 (three) times daily.     HYDROcodone -acetaminophen  (NORCO/VICODIN) 5-325 MG tablet Take 1 tablet by mouth every 4 (four) hours as needed for  moderate pain (pain score 4-6) or severe pain (pain score 7-10). 30 tablet 0   ipratropium (ATROVENT) 0.03 %  nasal spray Place 2 sprays into both nostrils 2 (two) times daily as needed for rhinitis.     Multiple Vitamin (MULTIVITAMIN) capsule Take 1 capsule by mouth daily.     naproxen  (NAPROSYN ) 500 MG tablet Take 1 tablet (500 mg total) by mouth 2 (two) times daily with a meal. (Patient taking differently: Take 500 mg by mouth 2 (two) times daily as needed for moderate pain (pain score 4-6).) 60 tablet 1   ondansetron  (ZOFRAN ) 4 MG tablet Take 1 tablet (4 mg total) by mouth every 8 (eight) hours as needed for nausea or vomiting. 10 tablet 0   pantoprazole  (PROTONIX ) 40 MG tablet Take 40 mg by mouth daily.     sodium chloride  1 g tablet Take 1-2 g by mouth See admin instructions. Take 2 g in the morning, 1 g in the afternoon     umeclidinium bromide  (INCRUSE ELLIPTA ) 62.5 MCG/ACT AEPB Inhale 1 puff into the lungs daily. 90 each 3   zinc gluconate 50 MG tablet Take 50 mg by mouth daily.     No facility-administered medications prior to visit.         Objective:   Physical Exam Vitals:   02/18/24 1405  BP: 116/70  Pulse: 69  SpO2: 99%  Weight: 102 lb (46.3 kg)  Height: 5' 6 (1.676 m)   Gen: Pleasant, well-nourished, in no distress,  normal affect  ENT: No lesions,  mouth clear,  oropharynx clear, no postnasal drip  Neck: No JVD, no stridor  Lungs: No use of accessory muscles, no wheeze but she does have rhonchi bilaterally  Cardiovascular: RRR, heart sounds normal, no murmur or gallops, no peripheral edema  Musculoskeletal: No deformities, no cyanosis or clubbing  Neuro: alert, awake, non focal  Skin: Warm, no lesions or rash        Assessment & Plan:  COPD with acute exacerbation (HCC) Acute exacerbation with persistent cough, purulent sputum following probable influenza A (her husband was diagnosed).  No significant wheezing on exam but she does have rhonchi.  Will  treat her for an acute exacerbation with antibiotics and prednisone .  She will let us  know if she is not improving.  Continue BD therapy as ordered  Please take prednisone  as directed until completely gone Please take azithromycin  as directed until completely gone You can use Promethazine  DM cough syrup 5 cc up to every 6 hours if needed for cough suppression. Continue your Symbicort  and your Incruse as you have been taking them.  Remember to rinse and gargle after the Symbicort . Keep albuterol  available to use 2 puffs if needed for shortness of breath, chest tightness, wheezing. Follow-up in our office in 6 months.  Please call sooner if you are not feeling better or for any new respiratory issues.  Tobacco use disorder Still smoking.  Has cut down some.  Discussed with her that she desperately needs to stop altogether.  She is not ready set a quit date at this time.  Lung nodules We reviewed your CT scan of the chest from October.  This was stable compared with priors.  Good news. We will refer you to the lung cancer screening program for surveillance CT scans of the chest.  Your next scan would need to be in October 2026.   I personally spent a total of 30 minutes in the care of the patient today including preparing to see the patient, getting/reviewing separately obtained history, performing a medically appropriate exam/evaluation, counseling and educating, placing orders, documenting clinical  information in the EHR, independently interpreting results, and communicating results.   Lamar Chris, MD, PhD 02/18/2024, 3:12 PM  Pulmonary [facility] Care [phone] or if no answer before 7:00PM call [phone] For any issues after 7:00PM please call eLink [phone]   "

## 2024-03-02 ENCOUNTER — Other Ambulatory Visit: Payer: Self-pay | Admitting: Emergency Medicine

## 2024-03-09 ENCOUNTER — Telehealth: Payer: Self-pay

## 2024-03-09 NOTE — Telephone Encounter (Signed)
 Copied from CRM [id number]. Topic: Clinical - Prescription Issue >> Mar 09, 2024  2:54 PM Corean SAUNDERS wrote: Reason for CRM: Patient states the following medications price increased from $180 to $500   budesonide -formoterol  (SYMBICORT ) 160-4.5 MCG/ACT inhaler   umeclidinium bromide  (INCRUSE ELLIPTA ) 62.5 MCG/ACT AEPB Patient is requesting a  call back with recommendations from Dr. Shelah as she cannot afford that co-pay    Please advise formulary alternative for Symbicort  and Incruse

## 2024-03-10 ENCOUNTER — Other Ambulatory Visit (HOSPITAL_COMMUNITY): Payer: Self-pay

## 2024-03-10 NOTE — Telephone Encounter (Signed)
 Thanks

## 2024-03-10 NOTE — Telephone Encounter (Signed)
 Incurse & symbicort  cancelled at pharmacy.  Please advise.

## 2024-03-11 ENCOUNTER — Other Ambulatory Visit (HOSPITAL_COMMUNITY): Payer: Self-pay

## 2024-03-11 NOTE — Telephone Encounter (Signed)
Following up on previous request
